# Patient Record
Sex: Male | Born: 1935 | Hispanic: No | Smoking: Former smoker
Health system: Southern US, Community
[De-identification: ages and names within clinical notes are randomized; demographics above are authoritative.]

## PROBLEM LIST (undated history)

## (undated) DIAGNOSIS — R310 Gross hematuria: Secondary | ICD-10-CM

## (undated) DIAGNOSIS — IMO0001 Reserved for inherently not codable concepts without codable children: Secondary | ICD-10-CM

## (undated) DIAGNOSIS — C61 Malignant neoplasm of prostate: Secondary | ICD-10-CM

## (undated) DIAGNOSIS — J449 Chronic obstructive pulmonary disease, unspecified: Secondary | ICD-10-CM

## (undated) DIAGNOSIS — R972 Elevated prostate specific antigen [PSA]: Secondary | ICD-10-CM

## (undated) DIAGNOSIS — M1990 Unspecified osteoarthritis, unspecified site: Secondary | ICD-10-CM

## (undated) DIAGNOSIS — H9190 Unspecified hearing loss, unspecified ear: Secondary | ICD-10-CM

## (undated) DIAGNOSIS — N200 Calculus of kidney: Secondary | ICD-10-CM

## (undated) DIAGNOSIS — Z8719 Personal history of other diseases of the digestive system: Secondary | ICD-10-CM

## (undated) HISTORY — DX: Calculus of kidney: N20.0

## (undated) HISTORY — DX: Chronic obstructive pulmonary disease, unspecified: J44.9

## (undated) HISTORY — PX: BACK SURGERY: SHX140

## (undated) HISTORY — DX: Gross hematuria: R31.0

## (undated) HISTORY — PX: HERNIA REPAIR: SHX51

## (undated) HISTORY — DX: Elevated prostate specific antigen (PSA): R97.20

## (undated) HISTORY — DX: Unspecified osteoarthritis, unspecified site: M19.90

---

## 2008-03-20 ENCOUNTER — Ambulatory Visit: Payer: Self-pay | Admitting: Radiation Oncology

## 2008-04-10 ENCOUNTER — Ambulatory Visit: Payer: Self-pay | Admitting: Urology

## 2008-04-18 ENCOUNTER — Ambulatory Visit: Payer: Self-pay | Admitting: Radiation Oncology

## 2008-04-19 ENCOUNTER — Ambulatory Visit: Payer: Self-pay | Admitting: Radiation Oncology

## 2008-04-24 ENCOUNTER — Ambulatory Visit: Payer: Self-pay | Admitting: Radiation Oncology

## 2008-05-12 ENCOUNTER — Emergency Department: Payer: Self-pay | Admitting: Emergency Medicine

## 2008-05-20 ENCOUNTER — Ambulatory Visit: Payer: Self-pay | Admitting: Radiation Oncology

## 2008-06-19 ENCOUNTER — Ambulatory Visit: Payer: Self-pay | Admitting: Radiation Oncology

## 2008-07-20 ENCOUNTER — Ambulatory Visit: Payer: Self-pay | Admitting: Radiation Oncology

## 2008-08-20 ENCOUNTER — Ambulatory Visit: Payer: Self-pay | Admitting: Radiation Oncology

## 2008-09-04 IMAGING — CT CT GUIDANCE PLACEMENT RAD THERAPY FIELDS
1 series · 16 of 32 positions shown, 20 images · non-contrast
Comparison: none

[Series 2: tx planning · axial · 0.98mm/px · z∈[-92,+185]mm · 16 of 123 slices shown, 20 images]
[im 8/123  soft-tissue]
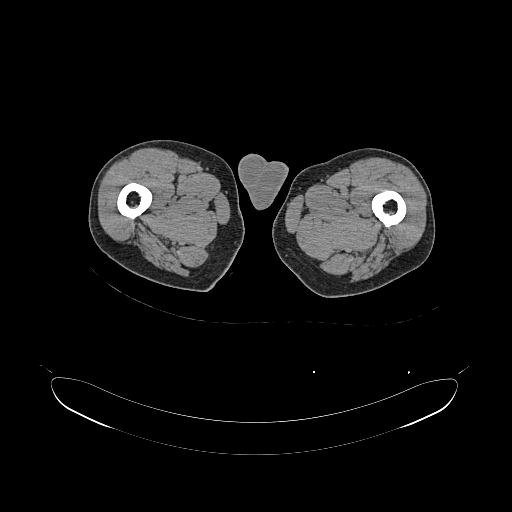
[im 8/123  bone]
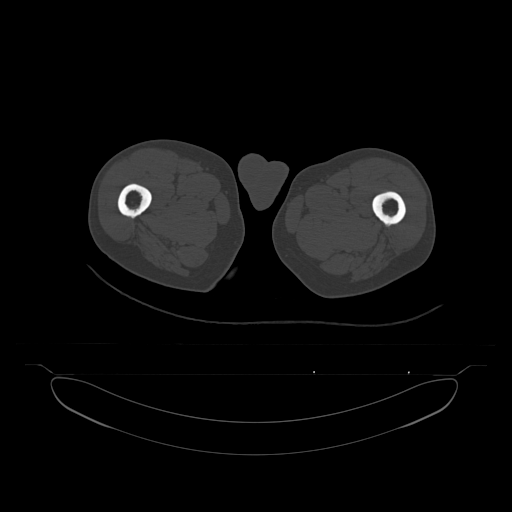
[im 16/123  soft-tissue]
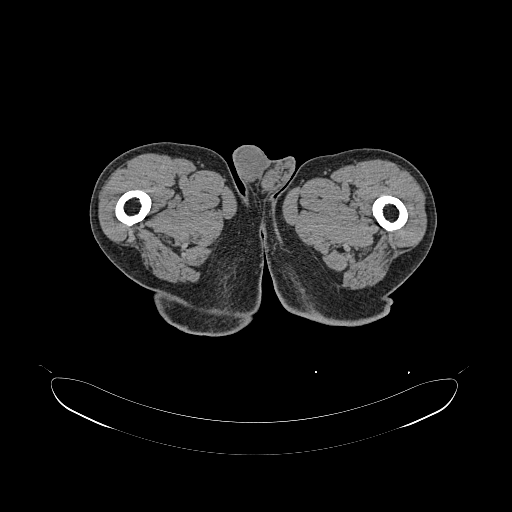
[im 24/123  soft-tissue]
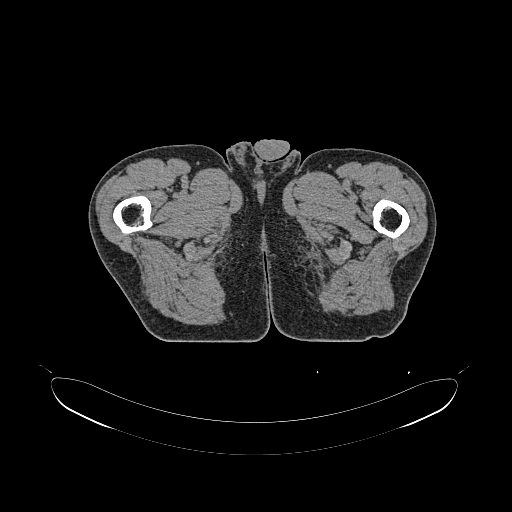
[im 32/123  soft-tissue]
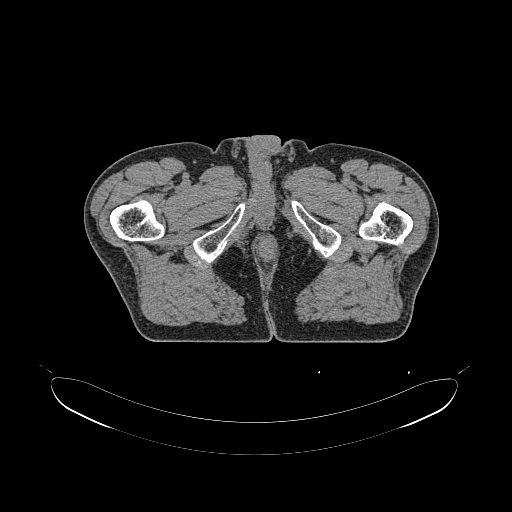
[im 40/123  soft-tissue]
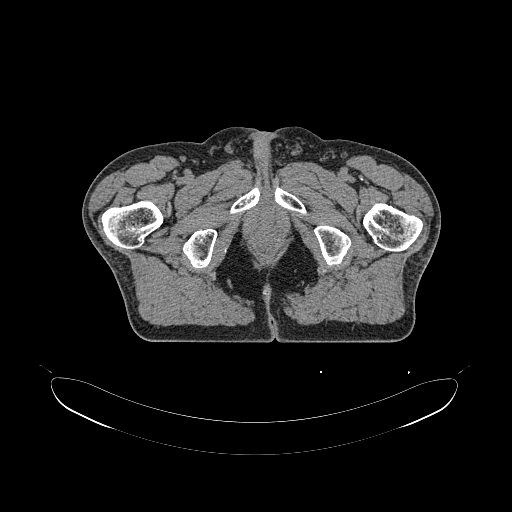
[im 48/123  soft-tissue]
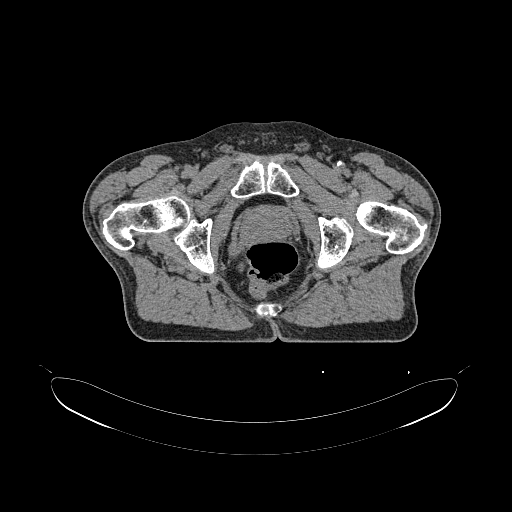
[im 56/123  soft-tissue]
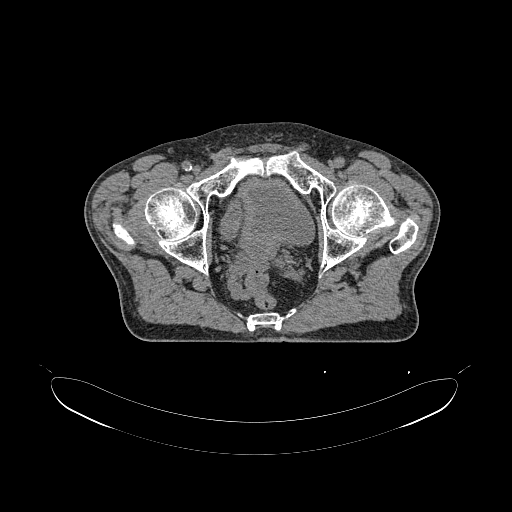
[im 67/123  soft-tissue]
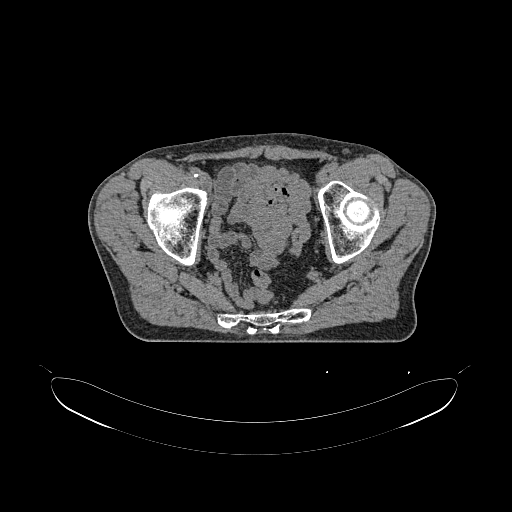
[im 75/123  soft-tissue]
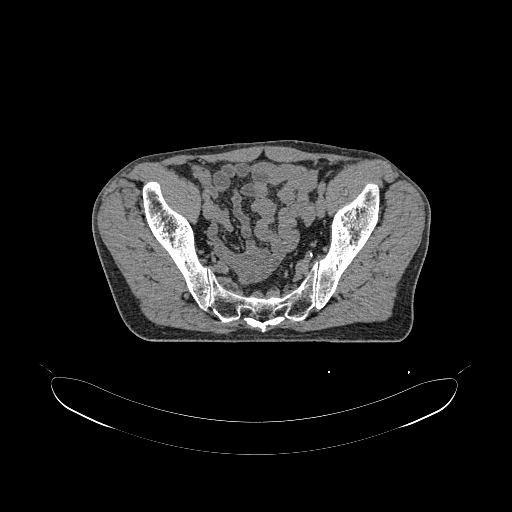
[im 75/123  bone]
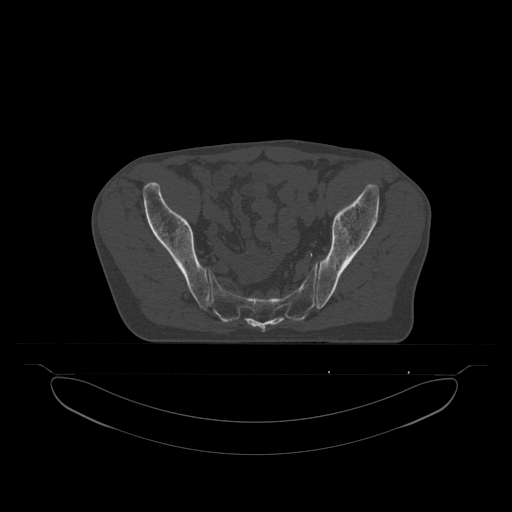
[im 83/123  soft-tissue]
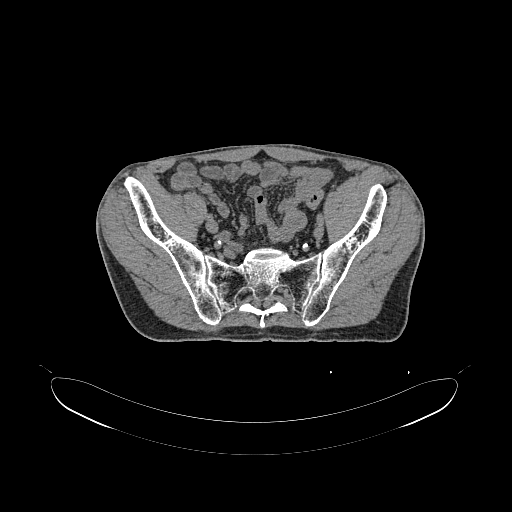
[im 91/123  soft-tissue]
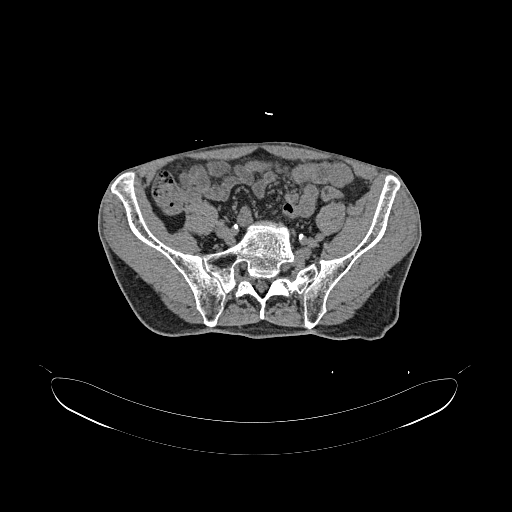
[im 99/123  soft-tissue]
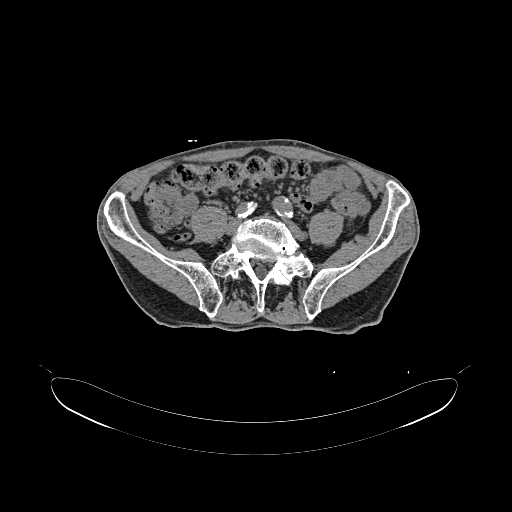
[im 107/123  soft-tissue]
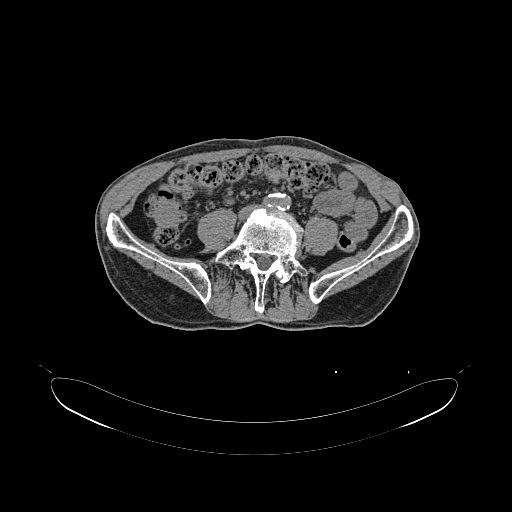
[im 107/123  lung]
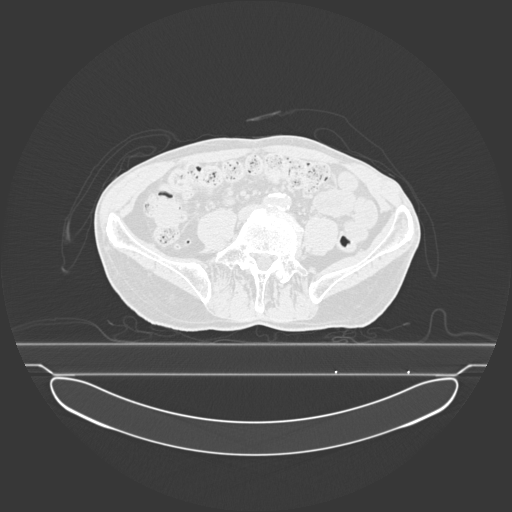
[im 111/123  lung]
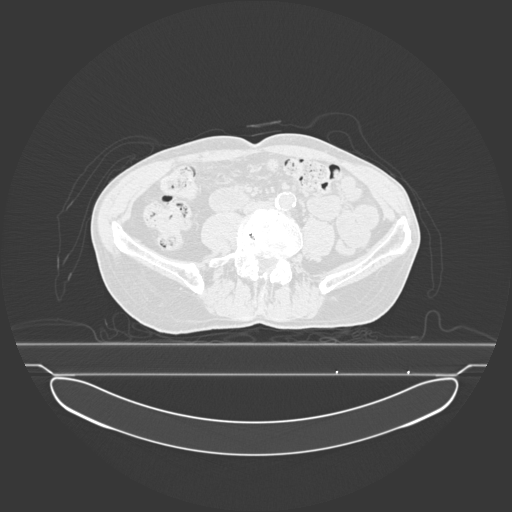
[im 115/123  soft-tissue]
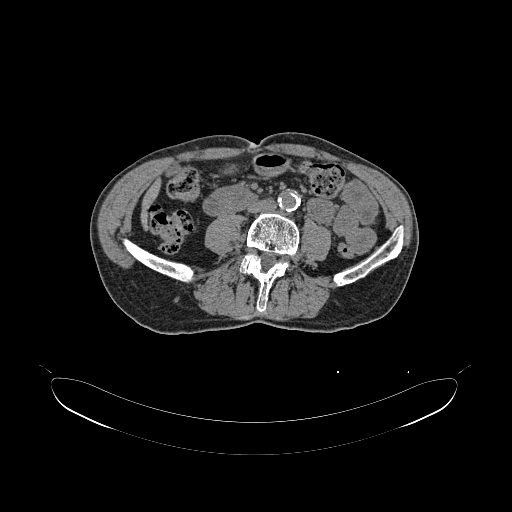
[im 115/123  lung]
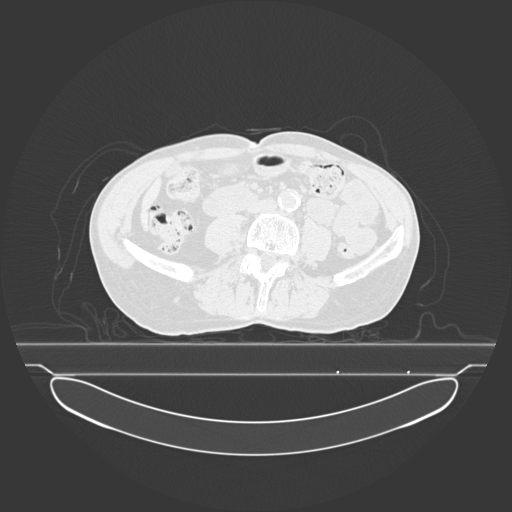
[im 119/123  lung]
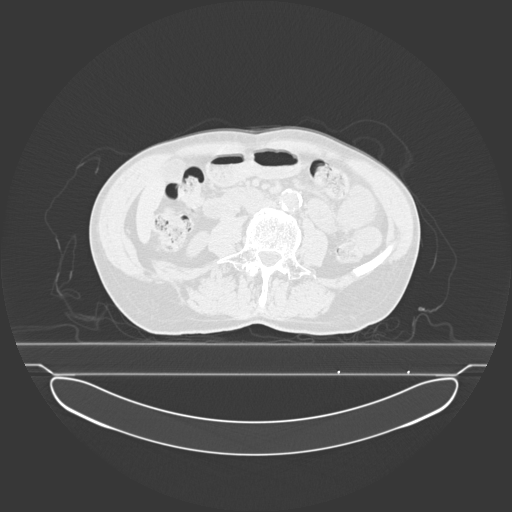

[16 of 32 positions shown; findings below may reference images not displayed]

IMAGES IMPORTED FROM THE SYNGO WORKFLOW SYSTEM
NO DICTATION FOR STUDY

## 2008-09-19 ENCOUNTER — Ambulatory Visit: Payer: Self-pay | Admitting: Radiation Oncology

## 2008-12-20 ENCOUNTER — Ambulatory Visit: Payer: Self-pay | Admitting: Radiation Oncology

## 2008-12-30 ENCOUNTER — Ambulatory Visit: Payer: Self-pay | Admitting: Radiation Oncology

## 2009-01-20 ENCOUNTER — Ambulatory Visit: Payer: Self-pay | Admitting: Radiation Oncology

## 2009-05-20 ENCOUNTER — Ambulatory Visit: Payer: Self-pay | Admitting: Gastroenterology

## 2009-06-02 ENCOUNTER — Ambulatory Visit: Payer: Self-pay | Admitting: Gastroenterology

## 2009-06-19 ENCOUNTER — Ambulatory Visit: Payer: Self-pay | Admitting: Radiation Oncology

## 2009-07-03 ENCOUNTER — Ambulatory Visit: Payer: Self-pay | Admitting: Radiation Oncology

## 2009-07-20 ENCOUNTER — Ambulatory Visit: Payer: Self-pay | Admitting: Radiation Oncology

## 2010-04-02 DIAGNOSIS — Z8546 Personal history of malignant neoplasm of prostate: Secondary | ICD-10-CM | POA: Insufficient documentation

## 2010-04-02 DIAGNOSIS — Z86010 Personal history of colonic polyps: Secondary | ICD-10-CM

## 2010-06-19 ENCOUNTER — Ambulatory Visit: Payer: Self-pay | Admitting: Radiation Oncology

## 2010-07-03 ENCOUNTER — Ambulatory Visit: Payer: Self-pay | Admitting: Radiation Oncology

## 2010-07-20 ENCOUNTER — Ambulatory Visit: Payer: Self-pay | Admitting: Radiation Oncology

## 2010-09-17 ENCOUNTER — Ambulatory Visit: Payer: Self-pay | Admitting: Unknown Physician Specialty

## 2011-07-05 ENCOUNTER — Ambulatory Visit: Payer: Self-pay | Admitting: Unknown Physician Specialty

## 2011-07-09 ENCOUNTER — Ambulatory Visit: Payer: Self-pay | Admitting: Radiation Oncology

## 2011-07-10 LAB — PSA: PSA: 0.5 ng/mL (ref 0.0–4.0)

## 2011-07-21 ENCOUNTER — Ambulatory Visit: Payer: Self-pay | Admitting: Radiation Oncology

## 2012-07-05 ENCOUNTER — Ambulatory Visit: Payer: Self-pay | Admitting: Radiation Oncology

## 2012-07-20 ENCOUNTER — Ambulatory Visit: Payer: Self-pay | Admitting: Radiation Oncology

## 2012-12-20 ENCOUNTER — Ambulatory Visit: Payer: Self-pay | Admitting: Radiation Oncology

## 2013-06-01 DIAGNOSIS — C61 Malignant neoplasm of prostate: Secondary | ICD-10-CM | POA: Insufficient documentation

## 2013-06-01 DIAGNOSIS — R972 Elevated prostate specific antigen [PSA]: Secondary | ICD-10-CM | POA: Insufficient documentation

## 2013-07-04 ENCOUNTER — Ambulatory Visit: Payer: Self-pay | Admitting: Radiation Oncology

## 2013-07-20 ENCOUNTER — Ambulatory Visit: Payer: Self-pay | Admitting: Radiation Oncology

## 2014-01-07 ENCOUNTER — Ambulatory Visit: Payer: Self-pay | Admitting: Radiation Oncology

## 2014-01-20 ENCOUNTER — Ambulatory Visit: Payer: Self-pay | Admitting: Radiation Oncology

## 2014-07-05 ENCOUNTER — Ambulatory Visit: Payer: Self-pay | Admitting: Radiation Oncology

## 2014-09-24 ENCOUNTER — Ambulatory Visit: Payer: Self-pay | Admitting: Urology

## 2015-04-24 ENCOUNTER — Encounter: Payer: Self-pay | Admitting: Radiation Oncology

## 2015-04-24 ENCOUNTER — Ambulatory Visit
Admission: RE | Admit: 2015-04-24 | Discharge: 2015-04-24 | Disposition: A | Discharge status: Home / Self Care | Payer: PPO | Source: Ambulatory Visit | Attending: Radiation Oncology | Admitting: Radiation Oncology

## 2015-04-24 VITALS — BP 126/79 | HR 56 | Temp 97.1°F | Resp 20 | Ht 69.0 in | Wt 143.1 lb

## 2015-04-24 DIAGNOSIS — Z510 Encounter for antineoplastic radiation therapy: Secondary | ICD-10-CM | POA: Insufficient documentation

## 2015-04-24 DIAGNOSIS — C61 Malignant neoplasm of prostate: Secondary | ICD-10-CM

## 2015-04-24 HISTORY — DX: Malignant neoplasm of prostate: C61

## 2015-04-24 NOTE — Consult Note (Signed)
Radiation Oncology NEW PATIENT EVALUATION  Name: Sean Wood.  MRN: [id number]  Date:   04/24/2015     DOB: 02-13-1936   This 79 y.o. male patient presents to the clinic for second opinion about treatment plan for Prostate cancer.  REFERRING PHYSICIAN: Dr. Elnoria Howard CHIEF COMPLAINT:  Chief Complaint  Patient presents with  . Prostate Cancer    Pt is OPNA here to speak to Dr. Baruch Gouty about his options for an increasing PSA    DIAGNOSIS: The encounter diagnosis was Prostate cancer.   PREVIOUS INVESTIGATIONS:  Pathology Report, Imaging and Referral Report are reviewed bone scan is reviewed pathology report from 2 years prior rebiopsy of prostate requested for my review  HPI: Patient 79 year old male well known to our department having been treated in 2009 with IM RT radiation therapy for Gleason 7 (3+4) adenocarcinoma the prostate. In 2013 he started having rising PSA last time I saw him in follow-up was 01/07/2014 and his PSA had doubled over the past year. It is now climbed to around 11 range. He had biopsy about 2 years ago by urology which again was positive for adenocarcinoma I have requested that report for my review. He continues to do well had a bone scan back in October which was negative. He specifically denies diarrhea dysuria or any other GI/GU complaints. He is having no bone pain. He is seen today for consideration of treatment options regarding his rising PSA. He has been offered [facility] agonist by urology.  PLANNED TREATMENT REGIMEN: Observation versus Lupron versus salvage seed implantation  PAST MEDICAL HISTORY:  has a past medical history of Prostate cancer.   This is a is a is a is PAST SURGICAL HISTORY: No past surgical history on file.  FAMILY HISTORY: family history is not on file.  SOCIAL HISTORY:  reports that he has quit smoking. He uses smokeless tobacco.  ALLERGIES: Niacin and related; Statins; and Aleve  MEDICATIONS:  Current Outpatient Prescriptions   Medication Sig Dispense Refill  . Cholecalciferol (VITAMIN D-3) 1000 UNITS CAPS Take 1 capsule by mouth daily.    . Magnesium 400 MG TABS Take 1 tablet by mouth daily.    . Red Yeast Rice 600 MG TABS Take 1 tablet by mouth daily.     No current facility-administered medications for this encounter.    ECOG PERFORMANCE STATUS:  0 - Asymptomatic  REVIEW OF SYSTEMS:  Patient denies any weight loss, fatigue, weakness, fever, chills or night sweats. Patient denies any loss of vision, blurred vision. Patient denies any ringing  of the ears or hearing loss. No irregular heartbeat. Patient denies heart murmur or history of fainting. Patient denies any chest pain or pain radiating to her upper extremities. Patient denies any shortness of breath, difficulty breathing at night, cough or hemoptysis. Patient denies any swelling in the lower legs. Patient denies any nausea vomiting, vomiting of blood, or coffee ground material in the vomitus. Patient denies any stomach pain. Patient states has had normal bowel movements no significant constipation or diarrhea. Patient denies any dysuria, hematuria or significant nocturia. Patient denies any problems walking, swelling in the joints or loss of balance. Patient denies any skin changes, loss of hair or loss of weight. Patient denies any excessive worrying or anxiety or significant depression. Patient denies any problems with insomnia. Patient denies excessive thirst, polyuria, polydipsia. Patient denies any swollen glands, patient denies easy bruising or easy bleeding. Patient denies any recent infections, allergies or URI. Patient "s visual fields have  not changed significantly in recent time.    PHYSICAL EXAM: BP 126/79 mmHg  Pulse 56  Temp(Src) 97.1 F (36.2 C)  Resp 20  Ht 5\' 9"  (1.753 m)  Wt 143 lb 1.3 oz (64.9 kg)  BMI 21.12 kg/m2 Well-developed thin male in NAD. On rectal exam rectal sphincter tone is good. Prostate is firm asymmetric with right lobe  greater than the left. Sulcus is preserved bilaterally no other rectal abnormalities identified. Well-developed well-nourished patient in NAD. HEENT reveals PERLA, EOMI, discs not visualized.  Oral cavity is clear. No oral mucosal lesions are identified. Neck is clear without evidence of cervical or supraclavicular adenopathy. Lungs are clear to A&P. Cardiac examination is essentially unremarkable with regular rate and rhythm without murmur rub or thrill. Abdomen is benign with no organomegaly or masses noted. Motor sensory and DTR levels are equal and symmetric in the upper and lower extremities. Cranial nerves II through XII are grossly intact. Proprioception is intact. No peripheral adenopathy or edema is identified. No motor or sensory levels are noted. Crude visual fields are within normal range.   LABORATORY DATA:  No results found for this or any previous visit (from the past 72 hour(s)).   RADIOLOGY RESULTS: Bone scan is reviewed  IMPRESSION: Biochemical recurrence of his prostate cancer with biopsy-proven residual disease in his prostate in 79 year old male now out 7 years sinceIMRT radiation therapy.  PLAN: At this time we have discussed treatment options. I would classify them at the 3 options 1 being observation which I do not recommend since his PSA is over 10 greatly increasing his chances for developing bone metastasis. [place] would be Lupron therapy and the third would be salvage seed implantation. His been 2 years since his biopsy and I believe we he may benefit at this time from Lupron therapy to see if we can reset his PSA. Should the patient desire salvage seed implantation he will call our office and I have discussed the risks and benefits of that procedure. Risks and benefits of Lupron therapy were also discussed with the patient and his wife both saw comprehend my treatment plan and recommendations well. Patient has a follow-up appointment next week with Dr. Elnoria Howard.  I would like  to take this opportunity for allowing me to participate in the care of your patient.Armstead Peaks., MD

## 2015-05-26 ENCOUNTER — Ambulatory Visit
Admission: RE | Admit: 2015-05-26 | Discharge: 2015-05-26 | Disposition: A | Discharge status: Home / Self Care | Payer: PPO | Source: Ambulatory Visit | Attending: Radiation Oncology | Admitting: Radiation Oncology

## 2015-05-26 ENCOUNTER — Ambulatory Visit: Admission: RE | Admit: 2015-05-26 | Payer: PPO | Source: Ambulatory Visit | Admitting: Urology

## 2015-05-26 ENCOUNTER — Encounter: Payer: Self-pay | Admitting: Radiation Oncology

## 2015-05-26 VITALS — BP 140/75 | HR 60 | Temp 94.3°F | Resp 20 | Wt 143.7 lb

## 2015-05-26 DIAGNOSIS — C61 Malignant neoplasm of prostate: Secondary | ICD-10-CM | POA: Diagnosis not present

## 2015-05-26 DIAGNOSIS — Z510 Encounter for antineoplastic radiation therapy: Secondary | ICD-10-CM | POA: Diagnosis present

## 2015-05-26 SURGERY — ULTRASOUND, PROSTATE, FOR VOLUME DETERMINATION
Anesthesia: Choice

## 2015-05-29 ENCOUNTER — Other Ambulatory Visit: Payer: Self-pay | Admitting: Radiation Oncology

## 2015-05-29 DIAGNOSIS — Z510 Encounter for antineoplastic radiation therapy: Secondary | ICD-10-CM | POA: Diagnosis not present

## 2015-05-29 NOTE — Progress Notes (Signed)
Radiation Oncology Procedure Note  Name: Sean Wood.  MRN: [id number]  Date:   05/26/2015     DOB: September 21, 1936   This 79 y.o. male patient presents to the clinic for volume study for prostate cancer.  CHIEF COMPLAINT:  Chief Complaint  Patient presents with  . Prostate Cancer    Pt is here post volume study prior to prostate seed implant.    DIAGNOSIS: The encounter diagnosis was Malignant neoplasm of prostate.  [id number] 79 year old male well known to our department having been treated in 2009 with IM RT radiation therapy for Gleason 7 (3+4) adenocarcinoma the prostate. In 2013 he started having rising PSA last time I saw him in follow-up was 01/07/2014 and his PSA had doubled over the past year. It is now climbed to around 11 range. He had biopsy about 2 years ago by urology which again was positive for adenocarcinoma I have requested that report for my review. He continues to do well had a bone scan back in October which was negative. He specifically denies diarrhea dysuria or any other GI/GU complaints. He is having no bone pain. He is seen today for consideration of treatment options regarding his rising PSA. He has been offered [facility] agonist by urology. Patient is seen today for volume study.  PHYSICAL EXAM: Well-developed well-nourished patient in NAD. HEENT reveals PERLA, EOMI, discs not visualized.  Oral cavity is clear. No oral mucosal lesions are identified. Neck is clear without evidence of cervical or supraclavicular adenopathy. Lungs are clear to A&P. Cardiac examination is essentially unremarkable with regular rate and rhythm without murmur rub or thrill. Abdomen is benign with no organomegaly or masses noted. Motor sensory and DTR levels are equal and symmetric in the upper and lower extremities. Cranial nerves II through XII are grossly intact. Proprioception is intact. No peripheral adenopathy or edema is identified. No motor or sensory levels are noted. Crude visual  fields are within normal range.   BP Readings from Last 1 Encounters:  05/26/15 140/75   Pulse Readings from Last 1 Encounters:  05/26/15 60   Wt Readings from Last 1 Encounters:  05/26/15 143 lb 11.8 oz (65.2 kg)     PROCEDURE: Patient was taken to the cystoscopy suite in the OR. Patient was placed in the low lithotomy position. Foley catheter was placed. Trans-rectal ultrasound probe was inserted into the rectum and prostate seminal vesicles were visualized as well as bladder base. stepping images were performed on a 5 mm increments. Images will be placed in BrachyVision treatment planning system to determine seed placement coordinates for eventual I-125 interstitial implant. Images will be reviewed with the physics and dosimetry staff for final quality approval. I personally was present for the volume study and assisted in delineation of contour volumes.  At the end of the procedure Foley catheter was removed, rectal ultrasound probe was removed. Patient tolerated his procedures extremely well with no side effects or complaints. Patient has given appointment for interstitial implant date. Consent was signed today as well as history and physical performed in preparation for his outpatient surgical implant.    PLAN: CONSENT forms were signed. Patient is scheduled for I-125 interstitial implant with the intent to deliver boost for salvage radiation therapy to his prostate volume. Risks and benefits of treatment were explained to the patient in detail. I would like to take this opportunity for allowing me to participate in the care of your patient.Armstead Peaks., MD

## 2015-06-16 ENCOUNTER — Encounter
Admission: RE | Admit: 2015-06-16 | Discharge: 2015-06-16 | Disposition: A | Discharge status: Home / Self Care | Payer: PPO | Source: Ambulatory Visit | Attending: Radiation Oncology | Admitting: Radiation Oncology

## 2015-06-16 DIAGNOSIS — Z809 Family history of malignant neoplasm, unspecified: Secondary | ICD-10-CM | POA: Insufficient documentation

## 2015-06-16 DIAGNOSIS — Z01812 Encounter for preprocedural laboratory examination: Secondary | ICD-10-CM | POA: Insufficient documentation

## 2015-06-16 DIAGNOSIS — C61 Malignant neoplasm of prostate: Secondary | ICD-10-CM | POA: Diagnosis not present

## 2015-06-16 DIAGNOSIS — J449 Chronic obstructive pulmonary disease, unspecified: Secondary | ICD-10-CM | POA: Insufficient documentation

## 2015-06-16 DIAGNOSIS — Z833 Family history of diabetes mellitus: Secondary | ICD-10-CM | POA: Insufficient documentation

## 2015-06-16 DIAGNOSIS — Z8249 Family history of ischemic heart disease and other diseases of the circulatory system: Secondary | ICD-10-CM | POA: Diagnosis not present

## 2015-06-16 DIAGNOSIS — Z01810 Encounter for preprocedural cardiovascular examination: Secondary | ICD-10-CM | POA: Insufficient documentation

## 2015-06-16 DIAGNOSIS — R0602 Shortness of breath: Secondary | ICD-10-CM | POA: Insufficient documentation

## 2015-06-16 DIAGNOSIS — N200 Calculus of kidney: Secondary | ICD-10-CM | POA: Insufficient documentation

## 2015-06-16 HISTORY — DX: Personal history of other diseases of the digestive system: Z87.19

## 2015-06-16 HISTORY — DX: Reserved for inherently not codable concepts without codable children: IMO0001

## 2015-06-16 HISTORY — DX: Unspecified hearing loss, unspecified ear: H91.90

## 2015-06-16 LAB — HEMOGLOBIN: HEMOGLOBIN: 14.8 g/dL (ref 13.0–18.0)

## 2015-06-16 LAB — BASIC METABOLIC PANEL
Anion gap: 6 (ref 5–15)
BUN: 15 mg/dL (ref 6–20)
CHLORIDE: 103 mmol/L (ref 101–111)
CO2: 33 mmol/L — ABNORMAL HIGH (ref 22–32)
Calcium: 9.5 mg/dL (ref 8.9–10.3)
Creatinine, Ser: 0.95 mg/dL (ref 0.61–1.24)
GFR calc Af Amer: >60 mL/min (ref >60)
Glucose, Bld: 86 mg/dL (ref 65–99)
POTASSIUM: 4.5 mmol/L (ref 3.5–5.1)
SODIUM: 142 mmol/L (ref 135–145)

## 2015-06-16 NOTE — Patient Instructions (Signed)
  Your procedure is scheduled on: June 24, 2015 (Tuesday) Report to Day Surgery. To find out your arrival time please call [phone] between 1PM - 3PM on June 20, 2015 (Friday).  Remember: Instructions that are not followed completely may result in serious medical risk, up to and including death, or upon the discretion of your surgeon and anesthesiologist your surgery may need to be rescheduled.    __x__ 1. Do not eat food or drink liquids after midnight. No gum chewing or hard candies. (CLEAR LIQUID DIET FOR 48 HOURS)    ____ 2. No Alcohol for 24 hours before or after surgery.   ____ 3. Bring all medications with you on the day of surgery if instructed.    __X__ 4. Notify your doctor if there is any change in your medical condition     (cold, fever, infections).     Do not wear jewelry, make-up, hairpins, clips or nail polish.  Do not wear lotions, powders, or perfumes. You may wear deodorant.  Do not shave 48 hours prior to surgery. Men may shave face and neck.  Do not bring valuables to the hospital.    [facility] is not responsible for any belongings or valuables.               Contacts, dentures or bridgework may not be worn into surgery.  Leave your suitcase in the car. After surgery it may be brought to your room.  For patients admitted to the hospital, discharge time is determined by your                treatment team.   Patients discharged the day of surgery will not be allowed to drive home.   Please read over the following fact sheets that you were given:   Surgical Site Infection Prevention   ____ Take these medicines the morning of surgery with A SIP OF WATER:    1.   2.   3.   4.  5.  6.  __X__ Fleet Enema (as directed) ( 2 HOURS PRIOR TO SURGERY)  __x__ Use CHG Soap as directed  ____ Use inhalers on the day of surgery  ____ Stop metformin 2 days prior to surgery    ____ Take 1/2 of usual insulin dose the night before surgery and none on the morning of  surgery.   ____ Stop Coumadin/Plavix/aspirin on   ____ Stop Anti-inflammatories on   __x__ Stop supplements until after surgery. (STOP RED YEAST RICE UNTIL AFTER SURGERY) ____ Bring C-Pap to the hospital.

## 2015-06-24 ENCOUNTER — Ambulatory Visit: Payer: PPO | Admitting: Anesthesiology

## 2015-06-24 ENCOUNTER — Inpatient Hospital Stay: Admission: RE | Admit: 2015-06-24 | Payer: PPO | Source: Ambulatory Visit

## 2015-06-24 ENCOUNTER — Ambulatory Visit: Payer: PPO | Admitting: Radiation Oncology

## 2015-06-24 ENCOUNTER — Encounter
Admission: RE | Disposition: A | Discharge status: Home / Self Care | Payer: Self-pay | Source: Ambulatory Visit | Attending: Urology

## 2015-06-24 ENCOUNTER — Other Ambulatory Visit: Payer: Self-pay | Admitting: Radiation Oncology

## 2015-06-24 ENCOUNTER — Encounter: Payer: Self-pay | Admitting: Anesthesiology

## 2015-06-24 ENCOUNTER — Ambulatory Visit
Admission: RE | Admit: 2015-06-24 | Discharge: 2015-06-24 | Disposition: A | Discharge status: Home / Self Care | Payer: PPO | Source: Ambulatory Visit | Attending: Urology | Admitting: Urology

## 2015-06-24 DIAGNOSIS — Z87891 Personal history of nicotine dependence: Secondary | ICD-10-CM | POA: Insufficient documentation

## 2015-06-24 DIAGNOSIS — K449 Diaphragmatic hernia without obstruction or gangrene: Secondary | ICD-10-CM | POA: Insufficient documentation

## 2015-06-24 DIAGNOSIS — C61 Malignant neoplasm of prostate: Secondary | ICD-10-CM | POA: Diagnosis not present

## 2015-06-24 DIAGNOSIS — Z886 Allergy status to analgesic agent status: Secondary | ICD-10-CM | POA: Diagnosis not present

## 2015-06-24 DIAGNOSIS — Z809 Family history of malignant neoplasm, unspecified: Secondary | ICD-10-CM | POA: Insufficient documentation

## 2015-06-24 DIAGNOSIS — Z8249 Family history of ischemic heart disease and other diseases of the circulatory system: Secondary | ICD-10-CM | POA: Diagnosis not present

## 2015-06-24 DIAGNOSIS — Z79899 Other long term (current) drug therapy: Secondary | ICD-10-CM | POA: Insufficient documentation

## 2015-06-24 DIAGNOSIS — Z87442 Personal history of urinary calculi: Secondary | ICD-10-CM | POA: Diagnosis not present

## 2015-06-24 DIAGNOSIS — M1380 Other specified arthritis, unspecified site: Secondary | ICD-10-CM | POA: Diagnosis not present

## 2015-06-24 DIAGNOSIS — J449 Chronic obstructive pulmonary disease, unspecified: Secondary | ICD-10-CM | POA: Insufficient documentation

## 2015-06-24 DIAGNOSIS — Z888 Allergy status to other drugs, medicaments and biological substances status: Secondary | ICD-10-CM | POA: Insufficient documentation

## 2015-06-24 DIAGNOSIS — Z833 Family history of diabetes mellitus: Secondary | ICD-10-CM | POA: Insufficient documentation

## 2015-06-24 HISTORY — PX: CYSTOSCOPY: SHX5120

## 2015-06-24 HISTORY — PX: RADIOACTIVE SEED IMPLANT: SHX5150

## 2015-06-24 SURGERY — INSERTION, RADIATION SOURCE, PROSTATE
Anesthesia: General | Wound class: Clean Contaminated

## 2015-06-24 MED ORDER — HYDROCODONE-ACETAMINOPHEN 5-325 MG PO TABS: 1.0000 | ORAL_TABLET | Freq: Four times a day (QID) | ORAL | Status: DC | PRN

## 2015-06-24 MED ORDER — OXYCODONE HCL 5 MG PO TABS: 5.0000 mg | ORAL_TABLET | Freq: Once | ORAL | Status: DC | PRN

## 2015-06-24 MED ORDER — CEFAZOLIN SODIUM 1-5 GM-% IV SOLN
INTRAVENOUS | Status: AC
  Filled 2015-06-24: qty 50

## 2015-06-24 MED ORDER — SODIUM CHLORIDE 0.9 % IR SOLN
Status: DC | PRN
  Administered 2015-06-24: 600 mL

## 2015-06-24 MED ORDER — ONDANSETRON HCL 4 MG/2ML IJ SOLN
INTRAMUSCULAR | Status: DC | PRN
  Administered 2015-06-24: 4 mg via INTRAVENOUS

## 2015-06-24 MED ORDER — FENTANYL CITRATE (PF) 100 MCG/2ML IJ SOLN: 25.0000 ug | INTRAMUSCULAR | Status: DC | PRN

## 2015-06-24 MED ORDER — TAMSULOSIN HCL 0.4 MG PO CAPS: 0.4000 mg | ORAL_CAPSULE | Freq: Every day | ORAL | Status: DC

## 2015-06-24 MED ORDER — CEFAZOLIN SODIUM 1-5 GM-% IV SOLN
1.0000 g | Freq: Once | INTRAVENOUS | Status: AC
  Administered 2015-06-24: 1 g via INTRAVENOUS

## 2015-06-24 MED ORDER — PROPOFOL 10 MG/ML IV BOLUS
INTRAVENOUS | Status: DC | PRN
  Administered 2015-06-24: 80 mg via INTRAVENOUS

## 2015-06-24 MED ORDER — DEXAMETHASONE SODIUM PHOSPHATE 4 MG/ML IJ SOLN
INTRAMUSCULAR | Status: DC | PRN
  Administered 2015-06-24: 8 mg via INTRAVENOUS

## 2015-06-24 MED ORDER — FENTANYL CITRATE (PF) 100 MCG/2ML IJ SOLN
INTRAMUSCULAR | Status: DC | PRN
  Administered 2015-06-24 (×4): 25 ug via INTRAVENOUS

## 2015-06-24 MED ORDER — EPHEDRINE SULFATE 50 MG/ML IJ SOLN
INTRAMUSCULAR | Status: DC | PRN
  Administered 2015-06-24 (×3): 5 mg via INTRAVENOUS

## 2015-06-24 MED ORDER — LACTATED RINGERS IV SOLN
INTRAVENOUS | Status: DC
  Administered 2015-06-24: 07:00:00 via INTRAVENOUS

## 2015-06-24 MED ORDER — GLYCOPYRROLATE 0.2 MG/ML IJ SOLN
INTRAMUSCULAR | Status: DC | PRN
  Administered 2015-06-24: 0.2 mg via INTRAVENOUS

## 2015-06-24 MED ORDER — FAMOTIDINE 20 MG PO TABS
20.0000 mg | ORAL_TABLET | Freq: Once | ORAL | Status: AC
  Administered 2015-06-24: 20 mg via ORAL

## 2015-06-24 MED ORDER — LIDOCAINE HCL (CARDIAC) 20 MG/ML IV SOLN
INTRAVENOUS | Status: DC | PRN
  Administered 2015-06-24: 100 mg via INTRAVENOUS

## 2015-06-24 MED ORDER — OXYCODONE HCL 5 MG/5ML PO SOLN: 5.0000 mg | Freq: Once | ORAL | Status: DC | PRN

## 2015-06-24 MED ORDER — CIPROFLOXACIN HCL 500 MG PO TABS: 500.0000 mg | ORAL_TABLET | Freq: Two times a day (BID) | ORAL | Status: DC

## 2015-06-24 SURGICAL SUPPLY — 31 items
BAG URO DRAIN 2000ML W/SPOUT (MISCELLANEOUS) ×4 IMPLANT
BLADE CLIPPER SURG (BLADE) ×4 IMPLANT
CATH FOL 2WAY LX 16X5 (CATHETERS) ×4 IMPLANT
CATH ROBINSON RED A/P 16FR (CATHETERS) ×2 IMPLANT
DRAPE INCISE 23X17 IOBAN STRL (DRAPES) ×2
DRAPE INCISE 23X17 STRL (DRAPES) ×2 IMPLANT
DRAPE INCISE IOBAN 23X17 STRL (DRAPES) ×2 IMPLANT
DRAPE TABLE BACK 80X90 (DRAPES) ×4 IMPLANT
DRAPE UNDER BUTTOCK W/FLU (DRAPES) ×4 IMPLANT
GLOVE BIO SURGEON STRL SZ 6.5 (GLOVE) ×6 IMPLANT
GLOVE BIO SURGEON STRL SZ7 (GLOVE) ×8 IMPLANT
GLOVE BIO SURGEON STRL SZ7.5 (GLOVE) ×8 IMPLANT
GLOVE BIO SURGEONS STRL SZ 6.5 (GLOVE) ×2
GOWN STRL REUS W/ TWL LRG LVL3 (GOWN DISPOSABLE) ×4 IMPLANT
GOWN STRL REUS W/ TWL XL LVL3 (GOWN DISPOSABLE) ×2 IMPLANT
GOWN STRL REUS W/TWL LRG LVL3 (GOWN DISPOSABLE) ×8
GOWN STRL REUS W/TWL XL LVL3 (GOWN DISPOSABLE) ×4
GRADUATE 1200CC STRL 31836 (MISCELLANEOUS) ×2 IMPLANT
IV NS 1000ML (IV SOLUTION) ×4
IV NS 1000ML BAXH (IV SOLUTION) ×2 IMPLANT
JELLY LUB 2OZ STRL (MISCELLANEOUS) ×2
JELLY LUBE 2OZ STRL (MISCELLANEOUS) ×2 IMPLANT
KIT RM TURNOVER CYSTO AR (KITS) ×4 IMPLANT
PACK CYSTO AR (MISCELLANEOUS) ×4 IMPLANT
PAD PREP 24X41 OB/GYN DISP (PERSONAL CARE ITEMS) ×4 IMPLANT
SET CYSTO W/LG BORE CLAMP LF (SET/KITS/TRAYS/PACK) ×4 IMPLANT
SOL PREP PVP 2OZ (MISCELLANEOUS) ×4
SOLUTION PREP PVP 2OZ (MISCELLANEOUS) ×2 IMPLANT
SYRINGE IRR TOOMEY STRL 70CC (SYRINGE) ×4 IMPLANT
WATER STERILE IRR 1000ML POUR (IV SOLUTION) ×4 IMPLANT
WATER STERILE IRR 3000ML UROMA (IV SOLUTION) ×2 IMPLANT

## 2015-06-24 NOTE — Op Note (Signed)
Preoperative diagnosis: Adenocarcinoma of the prostate   Postoperative diagnosis: Same   Procedure: I-125 prostate seed implantation, cystoscopy  Surgeon: Hollice Espy M.D. , Lavena Stanford, M.D.   Anesthesia: General  Drains: none  Complications: none  Indications: 79 yo M with history of prostate cancer s/p ERBT, biochemical recurrence presents for salvage brachytherapy.    Procedure: Patient was brought to operating suite and placement table in the supine position. At this time, a universal timeout protocol was performed, all team members were identified, Venodyne boots are placed, and he was administered IV Ancef in the preoperative period. He was placed in lithotomy position and prepped and draped in usual manner. Radiation oncology department placed a transrectal ultrasound probe anchoring stand/ grid and aligned with previous imaging from the volume study. Foley catheter was inserted without difficulty.  All needle passage was done with real-time transrectal ultrasound guidance in both the transverse and sagittal plains in order to achieve the desired preplanned position. A total of 12 needles were placed.  50 active seeds were implanted. The Foley catheter was removed and a rigid cystoscopy failed to show any seeds outside the prostate without evidence of trauma to the urethral, prostatic fossa, or bladder.  The bladder was drained.  A fluoroscopic image was then obtained showing excellent distrubution of the brachytherapy seeds.  Each seed was counted and counts were correct.    The patient was then repositioned in the supine position, reversed from anesthesia, and taken to the PACU in stable condition.

## 2015-06-24 NOTE — Interval H&P Note (Signed)
History and Physical Interval Note:  06/24/2015 7:32 AM  Sean Wood.  has presented today for surgery, with the diagnosis of PROSTATE CANCER  The various methods of treatment have been discussed with the patient and family. After consideration of risks, benefits and other options for treatment, the patient has consented to  Procedure(s): RADIOACTIVE SEED IMPLANT/BRACHYTHERAPY IMPLANT (N/A) as a surgical intervention .  The patient's history has been reviewed, patient examined, no change in status, stable for surgery.  I have reviewed the patient's chart and labs.  Questions were answered to the patient's satisfaction.    RRR CTAB   Hollice Espy

## 2015-06-24 NOTE — Anesthesia Postprocedure Evaluation (Signed)
  Anesthesia Post-op Note  Patient: Sean Wood.  Procedure(s) Performed: Procedure(s): RADIOACTIVE SEED IMPLANT/BRACHYTHERAPY IMPLANT (N/A) CYSTOSCOPY  Anesthesia type:General  Patient location: PACU  Post pain: Pain level controlled  Post assessment: Post-op Vital signs reviewed, Patient's Cardiovascular Status Stable, Respiratory Function Stable, Patent Airway and No signs of Nausea or vomiting  Post vital signs: Reviewed and stable  Last Vitals:  Filed Vitals:   06/24/15 0902  BP: 100/63  Pulse: 72  Temp: 36.4 C  Resp: 19    Level of consciousness: awake, alert  and patient cooperative  Complications: No apparent anesthesia complications

## 2015-06-24 NOTE — Anesthesia Preprocedure Evaluation (Signed)
Anesthesia Evaluation  Patient identified by MRN, date of birth, ID band Patient awake    Reviewed: Allergy & Precautions, H&P , NPO status , Patient's Chart, lab work & pertinent test results, reviewed documented beta blocker date and time   Airway Mallampati: II  TM Distance: >3 FB Neck ROM: full    Dental no notable dental hx. (+) Teeth Intact   Pulmonary neg pulmonary ROS, shortness of breath, COPD breath sounds clear to auscultation  Pulmonary exam normal       Cardiovascular Exercise Tolerance: Good negative cardio ROS Normal cardiovascular examRhythm:regular Rate:Normal     Neuro/Psych negative neurological ROS  negative psych ROS   GI/Hepatic Neg liver ROS, hiatal hernia,   Endo/Other  negative endocrine ROS  Renal/GU Renal diseasenegative Renal ROS  negative genitourinary   Musculoskeletal   Abdominal   Peds  Hematology negative hematology ROS (+)   Anesthesia Other Findings   Reproductive/Obstetrics (+) Pregnancy                             Anesthesia Physical Anesthesia Plan  ASA: III  Anesthesia Plan: General   Post-op Pain Management:    Induction:   Airway Management Planned:   Additional Equipment:   Intra-op Plan:   Post-operative Plan:   Informed Consent: I have reviewed the patients History and Physical, chart, labs and discussed the procedure including the risks, benefits and alternatives for the proposed anesthesia with the patient or authorized representative who has indicated his/her understanding and acceptance.     Plan Discussed with: CRNA, Anesthesiologist and Surgeon  Anesthesia Plan Comments:         Anesthesia Quick Evaluation

## 2015-06-24 NOTE — Anesthesia Procedure Notes (Signed)
Procedure Name: LMA Insertion Date/Time: 06/24/2015 7:39 AM Performed by: Alda Berthold Pre-anesthesia Checklist: Patient identified, Emergency Drugs available, Suction available and Patient being monitored Patient Re-evaluated:Patient Re-evaluated prior to inductionOxygen Delivery Method: Circle system utilized Preoxygenation: Pre-oxygenation with 100% oxygen Intubation Type: IV induction Ventilation: Mask ventilation without difficulty LMA: LMA inserted LMA Size: 4.0 Number of attempts: 1 Tube secured with: Tape Dental Injury: Teeth and Oropharynx as per pre-operative assessment

## 2015-06-24 NOTE — Progress Notes (Signed)
Bladder scanned    Less than 10cc

## 2015-06-24 NOTE — H&P (Signed)
Radiation Oncology Procedure Note  Name: Sean Wood. MRN: [id number] Date: 05/26/2015  DOB: 11/28/1936   This 79 y.o. male patient presents to the clinic for volume study for prostate cancer.  CHIEF COMPLAINT:  Chief Complaint  Patient presents with  . Prostate Cancer    Pt is here post volume study prior to prostate seed implant.    DIAGNOSIS: The encounter diagnosis was Malignant neoplasm of prostate.  [id number] 79 year old male well known to our department having been treated in 2009 with IM RT radiation therapy for Gleason 7 (3+4) adenocarcinoma the prostate. In 2013 he started having rising PSA last time I saw him in follow-up was 01/07/2014 and his PSA had doubled over the past year. It is now climbed to around 11 range. He had biopsy about 2 years ago by urology which again was positive for adenocarcinoma I have requested that report for my review. He continues to do well had a bone scan back in October which was negative. He specifically denies diarrhea dysuria or any other GI/GU complaints. He is having no bone pain. He is seen today for consideration of treatment options regarding his rising PSA. He has been offered [facility] agonist by urology. Patient is seen today for volume study.  PHYSICAL EXAM: Well-developed well-nourished patient in NAD. HEENT reveals PERLA, EOMI, discs not visualized. Oral cavity is clear. No oral mucosal lesions are identified. Neck is clear without evidence of cervical or supraclavicular adenopathy. Lungs are clear to A&P. Cardiac examination is essentially unremarkable with regular rate and rhythm without murmur rub or thrill. Abdomen is benign with no organomegaly or masses noted. Motor sensory and DTR levels are equal and symmetric in the upper and lower extremities. Cranial nerves II through XII are grossly intact. Proprioception is intact. No peripheral adenopathy or edema is identified. No motor or sensory levels are  noted. Crude visual fields are within normal range.   BP Readings from Last 1 Encounters:  05/26/15 140/75   Pulse Readings from Last 1 Encounters:  05/26/15 60   Wt Readings from Last 1 Encounters:  05/26/15 143 lb 11.8 oz (65.2 kg)     PROCEDURE: Patient was taken to the cystoscopy suite in the OR. Patient was placed in the low lithotomy position. Foley catheter was placed. Trans-rectal ultrasound probe was inserted into the rectum and prostate seminal vesicles were visualized as well as bladder base. stepping images were performed on a 5 mm increments. Images will be placed in BrachyVision treatment planning system to determine seed placement coordinates for eventual I-125 interstitial implant. Images will be reviewed with the physics and dosimetry staff for final quality approval. I personally was present for the volume study and assisted in delineation of contour volumes.  At the end of the procedure Foley catheter was removed, rectal ultrasound probe was removed. Patient tolerated his procedures extremely well with no side effects or complaints. Patient has given appointment for interstitial implant date. Consent was signed today as well as history and physical performed in preparation for his outpatient surgical implant.    PLAN: CONSENT forms were signed. Patient is scheduled for I-125 interstitial implant with the intent to deliver boost for salvage radiation therapy to his prostate volume. Risks and benefits of treatment were explained to the patient in detail. I would like to take this opportunity for allowing me to participate in the care of your patient.Armstead Peaks., MD

## 2015-06-24 NOTE — Transfer of Care (Signed)
Immediate Anesthesia Transfer of Care Note  Patient: Sean Wood.  Procedure(s) Performed: Procedure(s): RADIOACTIVE SEED IMPLANT/BRACHYTHERAPY IMPLANT (N/A)  Patient Location: PACU  Anesthesia Type:General  Level of Consciousness: sedated  Airway & Oxygen Therapy: Patient Spontanous Breathing and Patient connected to nasal cannula oxygen  Post-op Assessment: Report given to RN and Post -op Vital signs reviewed and stable  Post vital signs: Reviewed and stable  Last Vitals:  Filed Vitals:   06/24/15 0627  BP: 105/51  Pulse: 53  Temp: 36.4 C  Resp: 16    Complications: No apparent anesthesia complications

## 2015-06-24 NOTE — Progress Notes (Signed)
Radiation Oncology Procedure Note  Name: Sean Wood.  MRN: [id number]  Date:   06/24/2015     DOB: 1936/03/30   This 79 y.o. male patient presents to the hospital for I-125 interstitial implant for salvage.  CHIEF COMPLAINT: Prostate cancer  DIAGNOSIS: Prostate cancer  HPI: Patient is a 79 year old male well-known to department treated back in 2009 with IM RT radiation therapy for Gleason 7 (3+4) adenocarcinoma the prostate. He started having rising PSA in 2013 most recent PSA climbed and 11 range he had biopsy about 2 years ago positive for adenocarcinoma. He has been offered Lupron therapy although has refused. He did accept salvage I-125 interstitial implant which was performed today. 2 PHYSICAL EXAM: Well-developed well-nourished patient in NAD. HEENT reveals PERLA, EOMI, discs not visualized.  Oral cavity is clear. No oral mucosal lesions are identified. Neck is clear without evidence of cervical or supraclavicular adenopathy. Lungs are clear to A&P. Cardiac examination is essentially unremarkable with regular rate and rhythm without murmur rub or thrill. Abdomen is benign with no organomegaly or masses noted. Motor sensory and DTR levels are equal and symmetric in the upper and lower extremities. Cranial nerves II through XII are grossly intact. Proprioception is intact. No peripheral adenopathy or edema is identified. No motor or sensory levels are noted. Crude visual fields are within normal range.   BP Readings from Last 1 Encounters:  06/24/15 105/68   Pulse Readings from Last 1 Encounters:  06/24/15 56   Wt Readings from Last 1 Encounters:  06/16/15 143 lb (64.864 kg)     PROCEDURE: patient was taken to the operating room and general anesthesia was administered. Legs were immobilized in stirrups and patient was positioned in the exact same proportions as original volume study. Patient was prepped and Foley catheter was placed. Ultrasound guidance identified the prostate  and recreated the original set up as per treatment planning volume study. 16 needles were placed under ultrasound guidance with PVCs delivered to the prostate volume. After completion of procedure cystoscopy was performed by urology and no evidence of seeds in the bladder were noted. Patient tolerated the procedure extremely well. Initial plain film as doublecheck identified 50 seeds in the prostate. Patient has followup appointment in one month for CT scan for quality assurance will be performed.   PLAN: Patient has follow-up in one month for CT scan for quality assurance. He knows already to contact urology or our office for any concerns or complaints. Will start looking at PSA in about 4 months from now.  I would like to take this opportunity for allowing me to participate in the care of your patient.Armstead Peaks., MD

## 2015-06-24 NOTE — Discharge Instructions (Addendum)
Brachytherapy for Prostate Cancer Brachytherapy for prostate cancer is radiation treatment given from the inside of the prostate instead of from the outside by an external beam. There are two types of brachytherapy:  Low-dose-rate therapy. This involves seed or pellet implantation.  High-dose-rate therapy. This is given by thin tubes that contain radioactive material. When the seed method (also called implant therapy) is used, 80 to 120 little pellets are placed into the prostate and left in place. Because the radiation does not travel far from the prostate, healthy, noncancerous tissues around the prostate receive only a small dose of radiation, which helps protect them from injury. The radiation fades away over the next few months. If the high-dose therapy is used, the thin tubes of radiation are removed after the treatment and there is no radiation left in the prostate when you leave the hospital. This type of treatment is followed by a course of radiation by an external beam method on an outpatient basis. LET [facility] 2 CARE PROVIDER KNOW ABOUT:   Any allergies you have.  All medicines you are taking, including blood thinners, vitamins, herbs, eye drops, creams, and over-the-counter medicines.  Previous problems you or members of your family have had with the use of anesthetics.  Any blood disorders you have had.  Previous surgeries you have had.  Medical conditions you have. RISKS AND COMPLICATIONS  Generally, brachytherapy for prostate cancer is a safe procedure. However, problems can occur.  Possible short-term problems include:  Difficulty passing urine. You may need a catheter for a few days to a month.  Blood in the urine or semen.  A feeling of constipation because of prostate swelling.  Frequent feeling of an urgent need to urinate. Possible long-term problems include:  Inflammation of the rectum. This happens in about 2% of people who have the procedure.  Erection  problems. These vary with age and occur in about 15-40% of men.  Difficulty urinating. This is caused by scarring in the urethra.  Diarrhea. BEFORE THE PROCEDURE  You will be scheduled for some tests, which may include an ultrasound, CT scan, or MRI. These tests do not hurt. A specialist in radiation treatment will meet with you and will decide which type of brachytherapy to use based on these imaging tests. PROCEDURE   You will be given a medicine that makes you fall asleep (general anesthetic).  A small probe will be placed in the rectum.  Ultrasound waves will be used to guide the insertion of small tubes into the prostate in preplanned locations.  If the seed method (low-dose) is used:  Small pellets the size of a rice grain will be placed into the tube.  The tube will then be removed, leaving the seeds in the prostate.  If the high-dose method is used:  Radioactive wires will be inserted and left in until a specific dose of radiation has been given.  The wires will then be removed.  In both methods, a catheter is usually placed into the bladder. AFTER THE PROCEDURE  Follow-up depends on the type of treatment given. With the high-dose treatment, there is no radiation risk after the treatment. Usually additional treatment with external beam radiation will be provided. This will require a series of visits back to the clinic as an outpatient.  With the seed implant, some physicians recommend that close contact with children and pregnant women be limited because of the low dose of radiation still in the prostate. That radiation will be almost gone by 2  months, and by 4-6 months the levels will be almost undetectable.   Brachytherapy for Prostate Cancer, Care After Refer to this sheet in the next few weeks. These instructions provide you with information on caring for yourself after your procedure. Your health care provider may also give you more specific instructions. Your treatment  has been planned according to current medical practices, but problems sometimes occur. Call your health care provider if you have any problems or questions after your procedure. WHAT TO EXPECT AFTER THE PROCEDURE The area behind the scrotum will probably be tender and bruised. For a short period of time you may have:  Difficulty passing urine. You may need a catheter for a few days to a month.  Blood in the urine or semen.  A feeling of constipation because of prostate swelling.  Frequent feeling of an urgent need to urinate. For a long period of time you may have:  Inflammation of the rectum. This happens in about 2% of people who have the procedure.  Erection problems. These vary with age and occur in about 15-40% of men.  Difficulty urinating. This is caused by scarring in the urethra.  Diarrhea. HOME CARE INSTRUCTIONS   Take medicines only as directed by your health care provider.  You may have a catheter in your bladder for several days. You will have blood in the urine bag and should drink a lot of fluids to keep it a light red color.  Keep all follow-up visits as directed by your health care provider. If you have a catheter, it will be removed during one of these visits.  Try not to sit directly on the area behind the scrotum. A soft cushion can decrease the discomfort. Ice packs may also be helpful for the discomfort. Do not put ice directly on the skin.  Shower and wash the area behind the scrotum gently. Do not sit in a tub.  If you have had the brachytherapy that uses the seeds, limit your close contact with children and pregnant women for 2 months because of the radiation still in the prostate. After that period of time, the levels drop off quickly. SEEK IMMEDIATE MEDICAL CARE IF:   You have a fever.  You have chills.  You have shortness of breath.  You have chest pain.  You have thick blood, like tomato juice, in the urine bag.  Your catheter is blocked so  urine cannot get into the bag. Your bladder area or lower abdomen may be swollen.  There is excessive bleeding from your rectum. It is normal to have a little blood mixed with your stool.  There is severe discomfort in the treated area that does not go away with pain medicine. AMBULATORY SURGERY  DISCHARGE INSTRUCTIONS   The drugs that you were given will stay in your system until tomorrow so for the next 24 hours you should not:  Drive an automobile Make any legal decisions Drink any alcoholic beverage   You may resume regular meals tomorrow.  Today it is better to start with liquids and gradually work up to solid foods.  You may eat anything you prefer, but it is better to start with liquids, then soup and crackers, and gradually work up to solid foods.   Please notify your doctor immediately if you have any unusual bleeding, trouble breathing, redness and pain at the surgery site, drainage, fever, or pain not relieved by medication.    Additional Instructions:        Please  contact your physician with any problems or Same Day Surgery at [phone], Monday through Friday 6 am to 4 pm, or [place] at [facility] number at [phone].AMBULATORY SURGERY  DISCHARGE INSTRUCTIONS   The drugs that you were given will stay in your system until tomorrow so for the next 24 hours you should not:  Drive an automobile Make any legal decisions Drink any alcoholic beverage   You may resume regular meals tomorrow.  Today it is better to start with liquids and gradually work up to solid foods.  You may eat anything you prefer, but it is better to start with liquids, then soup and crackers, and gradually work up to solid foods.   Please notify your doctor immediately if you have any unusual bleeding, trouble breathing, redness and pain at the surgery site, drainage, fever, or pain not relieved by medication.    Additional Instructions:        Please contact your  physician with any problems or Same Day Surgery at [phone], Monday through Friday 6 am to 4 pm, or [place] at [facility] number at [phone].AMBULATORY SURGERY  DISCHARGE INSTRUCTIONS   The drugs that you were given will stay in your system until tomorrow so for the next 24 hours you should not:  Drive an automobile Make any legal decisions Drink any alcoholic beverage   You may resume regular meals tomorrow.  Today it is better to start with liquids and gradually work up to solid foods.  You may eat anything you prefer, but it is better to start with liquids, then soup and crackers, and gradually work up to solid foods.   Please notify your doctor immediately if you have any unusual bleeding, trouble breathing, redness and pain at the surgery site, drainage, fever, or pain not relieved by medication.    Additional Instructions:        Please contact your physician with any problems or Same Day Surgery at [phone], Monday through Friday 6 am to 4 pm, or [place] at [facility] number at [phone].AMBULATORY SURGERY  DISCHARGE INSTRUCTIONS   The drugs that you were given will stay in your system until tomorrow so for the next 24 hours you should not:  Drive an automobile Make any legal decisions Drink any alcoholic beverage   You may resume regular meals tomorrow.  Today it is better to start with liquids and gradually work up to solid foods.  You may eat anything you prefer, but it is better to start with liquids, then soup and crackers, and gradually work up to solid foods.   Please notify your doctor immediately if you have any unusual bleeding, trouble breathing, redness and pain at the surgery site, drainage, fever, or pain not relieved by medication.    Additional Instructions:         Please contact your physician with any problems or Same Day Surgery at [phone], Monday through Friday 6 am to 4 pm, or [place] at  [facility] number at [phone].You have abdominal discomfort.  You have severe nausea or vomiting.  You develop any new or unusual symptoms.

## 2015-07-22 ENCOUNTER — Ambulatory Visit
Admission: RE | Admit: 2015-07-22 | Discharge: 2015-07-22 | Disposition: A | Discharge status: Home / Self Care | Payer: PPO | Source: Ambulatory Visit | Attending: Radiation Oncology | Admitting: Radiation Oncology

## 2015-07-22 ENCOUNTER — Other Ambulatory Visit: Payer: Self-pay | Admitting: *Deleted

## 2015-07-22 ENCOUNTER — Encounter: Payer: Self-pay | Admitting: Radiation Oncology

## 2015-07-22 VITALS — BP 118/64 | HR 59 | Temp 94.8°F | Resp 18 | Wt 148.0 lb

## 2015-07-22 DIAGNOSIS — C61 Malignant neoplasm of prostate: Secondary | ICD-10-CM

## 2015-07-22 DIAGNOSIS — Z510 Encounter for antineoplastic radiation therapy: Secondary | ICD-10-CM | POA: Diagnosis not present

## 2015-07-22 NOTE — Progress Notes (Signed)
Radiation Oncology Follow up Note  Name: Sean Wood.   Date:   07/22/2015 MRN:  [id number] DOB: 19-Jun-1936    This 79 y.o. male presents to the clinic today for follow-up for I-125 interstitial implant.  REFERRING PROVIDER: Margo Common, PA  HPI: patient is a 79 year old male now 1 month out having completed I-125 interstitial implant for adenocarcinoma the prostate. He was originally treated in 2009 with IM RT for Gleason 7 (3+4) adenocarcinoma. In 2013 started having biochemical failure had repeat biopsy which was again positive for adenocarcinoma. He opted to go ahead with salvage I-125 interstitial implant. He is seen today in routine follow-up and is doing well. Has some loose stools but does not follow any regular diet does not use Imodium. He does eat a lot of bran which I've advised him against at this time. No frequency or urgency of urination..  COMPLICATIONS OF TREATMENT: none  FOLLOW UP COMPLIANCE: keeps appointments   PHYSICAL EXAM:  BP 118/64 mmHg  Pulse 59  Temp(Src) 94.8 F (34.9 C)  Resp 18  Wt 148 lb 0.6 oz (67.15 kg) On rectal exam prostate is firm no evidence of nodularity or mass is markedly shrunken size. No other rectal abnormality is identified. Well-developed well-nourished patient in NAD. HEENT reveals PERLA, EOMI, discs not visualized.  Oral cavity is clear. No oral mucosal lesions are identified. Neck is clear without evidence of cervical or supraclavicular adenopathy. Lungs are clear to A&P. Cardiac examination is essentially unremarkable with regular rate and rhythm without murmur rub or thrill. Abdomen is benign with no organomegaly or masses noted. Motor sensory and DTR levels are equal and symmetric in the upper and lower extremities. Cranial nerves II through XII are grossly intact. Proprioception is intact. No peripheral adenopathy or edema is identified. No motor or sensory levels are noted. Crude visual fields are within normal  range.   RADIOLOGY RESULTS: CT scan for quality assurance of seed implantation is reviewed and shows excellent placement of sources  PLAN: this time patient is doing well I've explained to him again our PSA protocol on like this see a PSA in about 4 months and I've asked to see him back in follow-up at that time. He otherwise continues to do well I've asked him to cut down on program products also his ensure to try to solidify more his stools. Patient is to call sooner with any concerns.  I would like to take this opportunity for allowing me to participate in the care of your patient.Armstead Peaks., MD

## 2015-08-08 ENCOUNTER — Encounter: Payer: Self-pay | Admitting: Family Medicine

## 2015-08-08 ENCOUNTER — Ambulatory Visit (INDEPENDENT_AMBULATORY_CARE_PROVIDER_SITE_OTHER): Payer: PPO | Admitting: Family Medicine

## 2015-08-08 VITALS — BP 106/64 | HR 67 | Temp 98.7°F | Resp 16 | Wt 147.8 lb

## 2015-08-08 DIAGNOSIS — J441 Chronic obstructive pulmonary disease with (acute) exacerbation: Secondary | ICD-10-CM | POA: Diagnosis not present

## 2015-08-08 DIAGNOSIS — J069 Acute upper respiratory infection, unspecified: Secondary | ICD-10-CM

## 2015-08-08 DIAGNOSIS — E7800 Pure hypercholesterolemia, unspecified: Secondary | ICD-10-CM | POA: Insufficient documentation

## 2015-08-08 DIAGNOSIS — L409 Psoriasis, unspecified: Secondary | ICD-10-CM | POA: Insufficient documentation

## 2015-08-08 DIAGNOSIS — J439 Emphysema, unspecified: Secondary | ICD-10-CM | POA: Insufficient documentation

## 2015-08-08 MED ORDER — AMOXICILLIN-POT CLAVULANATE 875-125 MG PO TABS: 1.0000 | ORAL_TABLET | Freq: Two times a day (BID) | ORAL | Status: DC

## 2015-08-08 NOTE — Progress Notes (Signed)
Patient ID: Sean Fitz., male   DOB: 05/20/1936, 79 y.o.   MRN: [id number]       Patient: Sean Wood. Male    DOB: 1936-02-18   79 y.o.   MRN: [id number] Visit Date: 08/08/2015  Today's Provider: [name], PA   Chief Complaint  Patient presents with  . Cough    started yesterday, Thrursday evening  . Sinusitis    started Monday or Tuesday   Subjective:    Cough This is a new problem. The current episode started yesterday. The problem has been unchanged. The problem occurs every few hours. The cough is productive of sputum. Associated symptoms include chills, headaches, nasal congestion, a sore throat, shortness of breath and sweats. Pertinent negatives include no chest pain, ear congestion, ear pain, fever, heartburn or wheezing. His past medical history is significant for emphysema. Dx in 1983  Sinusitis This is a new problem. The current episode started in the past 7 days. The problem is unchanged. There has been no fever. His pain is at a severity of 0/10. He is experiencing no pain. Associated symptoms include chills, congestion, coughing, diaphoresis, headaches, shortness of breath, sneezing and a sore throat. Pertinent negatives include no ear pain or sinus pressure. Treatments tried: tried whiskey with Karo syrup last night. and tried some Robitussin   History of prostate cancer Dr. Baruch Gouty put in radioactive implants  06-24-15. Concerned about recent cough and fever last night with chills (did not take temperature to document).  Patient Active Problem List   Diagnosis Date Noted  . Acute exacerbation of chronic obstructive airways disease 08/08/2015  . Chronic obstructive pulmonary emphysema 08/08/2015  . Hypercholesteremia 08/08/2015  . Psoriasis 08/08/2015  . Abnormal prostate specific antigen 06/01/2013  . CA of prostate 06/01/2013  . History of colon polyps 04/02/2010  . H/O malignant neoplasm of prostate 04/02/2010   Past Surgical History    Procedure Laterality Date  . Back surgery  1973, 1987  . Hernia repair      left and right inguinal hernia repair  . Radioactive seed implant N/A 06/24/2015    Procedure: RADIOACTIVE SEED IMPLANT/BRACHYTHERAPY IMPLANT;  Surgeon: Hollice Espy, MD;  Location: [facility] ORS;  Service: Urology;  Laterality: N/A;  . Cystoscopy  06/24/2015    Procedure: CYSTOSCOPY;  Surgeon: Hollice Espy, MD;  Location: [facility] ORS;  Service: Urology;;   Family History  Problem Relation Age of Onset  . Cancer Brother   . Heart disease Brother   . Diabetes Brother   . Emphysema Brother   . Diabetes Sister   . Emphysema Mother   . Healthy Sister   . Healthy Sister   . Healthy Sister   . Diabetes Brother   . Diabetes Brother   . Diabetes Brother   . Healthy Brother      Allergies  Allergen Reactions  . Mevacor [Lovastatin] Other (See Comments)    "feel washed out"  . Niacin And Related Other (See Comments)    "hot feeling"  . Statins Other (See Comments)    "feel washed out"  . Aleve [Naproxen Sodium] Rash   Previous Medications   ALBUTEROL (PROAIR HFA) 108 (90 BASE) MCG/ACT INHALER       CHOLECALCIFEROL (VITAMIN D-3) 1000 UNITS CAPS    Take 1 capsule by mouth daily.   COENZYME Q10 (COQ-10) 100 MG CAPS    Take 100 mg by mouth.   MAGNESIUM 400 MG TABS    Take 1 tablet by mouth  daily.   RED YEAST RICE 600 MG TABS    Take 2 tablets by mouth at bedtime.    TAMSULOSIN (FLOMAX) 0.4 MG CAPS CAPSULE    Take 1 capsule (0.4 mg total) by mouth daily.    Review of Systems  Constitutional: Positive for chills and diaphoresis. Negative for fever.  HENT: Positive for congestion, sneezing and sore throat. Negative for ear pain and sinus pressure.   Respiratory: Positive for cough and shortness of breath. Negative for wheezing.   Cardiovascular: Negative for chest pain.  Gastrointestinal: Negative for heartburn.  Neurological: Positive for headaches.    Social History  Substance Use Topics  . Smoking status:  Former Smoker -- 1.50 packs/day    Types: Cigarettes  . Smokeless tobacco: Current User    Types: Chew  . Alcohol Use: No   Objective:   BP 106/64 mmHg  Pulse 67  Temp(Src) 98.7 F (37.1 C) (Oral)  Resp 16  Wt 147 lb 12.8 oz (67.042 kg)  SpO2 94%  Physical Exam  Constitutional: He is oriented to person, place, and time. He appears well-developed and well-nourished. No distress.  HENT:  Head: Normocephalic and atraumatic.  Right Ear: Hearing and external ear normal.  Left Ear: Hearing and external ear normal.  Nose: Nose normal.  Left TM reddened with old perforation. No drainage. 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  Eyes: Conjunctivae, EOM and lids are normal. Pupils are equal, round, and reactive to light. Right eye exhibits no discharge. Left eye exhibits no discharge. No scleral icterus.  Cardiovascular: Normal rate and regular rhythm.   Pulmonary/Chest: Effort normal. No respiratory distress.  Coarse breath sounds.  Abdominal: Soft. Bowel sounds are normal.  Musculoskeletal: Normal range of motion.  Neurological: He is alert and oriented to person, place, and time.  Skin: Skin is intact. No lesion and no rash noted.  Psychiatric: He has a normal mood and affect. His speech is normal and behavior is normal. Thought content normal.      Assessment & Plan:     1. Upper respiratory infection Onset over the past week with rhinorrhea or sneezing. Started having some fever yesterday. May use tylenol or Aleve prn. Increase fluid intake and recheck in 7-10 days if needed.  2. Acute exacerbation of chronic obstructive airways disease Onset with URI symptoms. May use Robitussin-DM and add antibiotic. Recheck if needed in 7-10 days.  - amoxicillin-clavulanate (AUGMENTIN) 875-125 MG per tablet; Take 1 tablet by mouth 2 (two) times daily.  Dispense: 20 tablet; Refill: 0       [name], PA  [facility]

## 2015-10-22 IMAGING — RF DG ABDOMEN 1V
1 series · 1 of 1 positions shown · non-contrast
Comparison: none

[Series 1: retrogr.  pyelogr. · 1 of 1 slices shown]
[im 1/1]
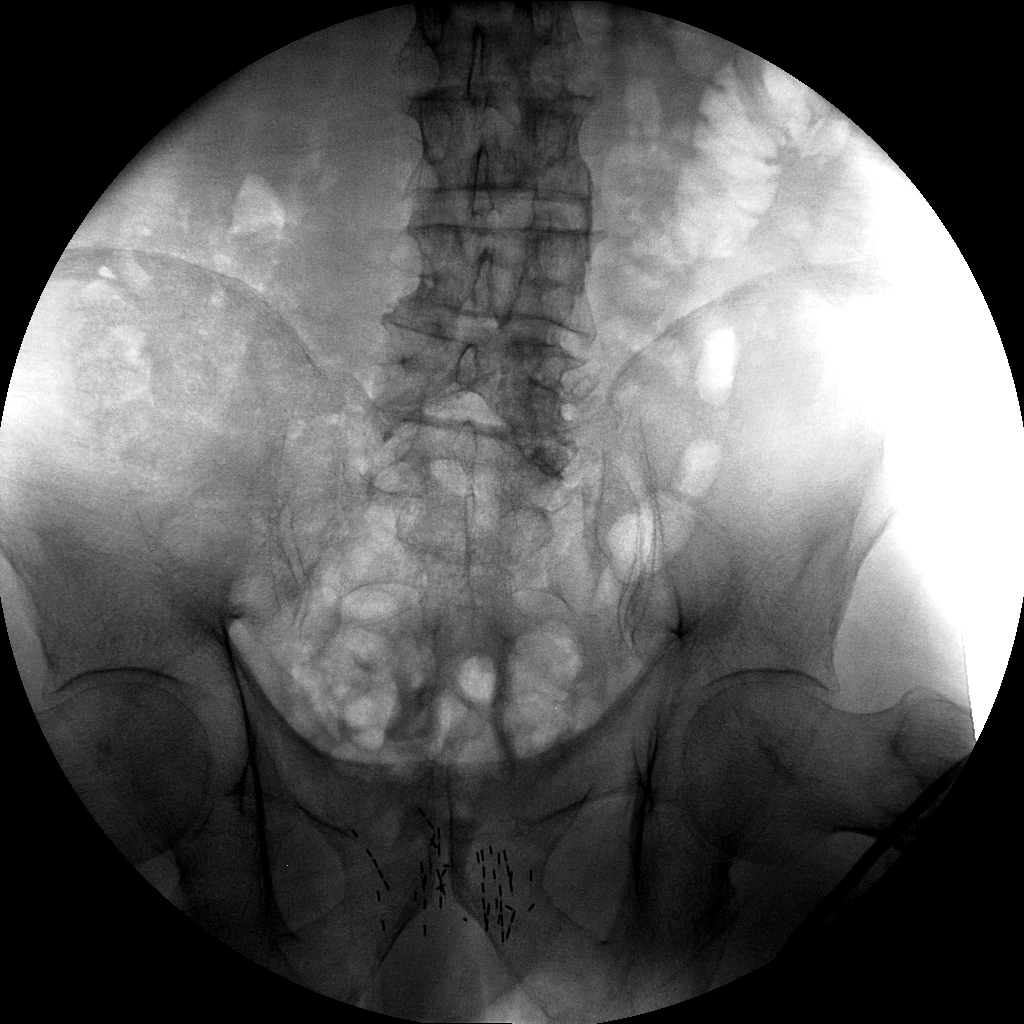

[1 of 1 positions shown; findings below may reference images not displayed]

Canned report from images found in remote index.

Refer to host system for actual result text.

## 2015-11-24 ENCOUNTER — Inpatient Hospital Stay: Payer: PPO | Attending: Radiation Oncology

## 2015-11-24 ENCOUNTER — Ambulatory Visit
Admission: RE | Admit: 2015-11-24 | Discharge: 2015-11-24 | Disposition: A | Discharge status: Home / Self Care | Payer: PPO | Source: Ambulatory Visit | Attending: Radiation Oncology | Admitting: Radiation Oncology

## 2015-11-24 ENCOUNTER — Encounter: Payer: Self-pay | Admitting: Radiation Oncology

## 2015-11-24 ENCOUNTER — Other Ambulatory Visit: Payer: Self-pay | Admitting: *Deleted

## 2015-11-24 VITALS — BP 114/65 | HR 64 | Temp 95.8°F | Resp 18 | Wt 153.4 lb

## 2015-11-24 DIAGNOSIS — C61 Malignant neoplasm of prostate: Secondary | ICD-10-CM

## 2015-11-24 DIAGNOSIS — R972 Elevated prostate specific antigen [PSA]: Secondary | ICD-10-CM | POA: Insufficient documentation

## 2015-11-24 DIAGNOSIS — Z79818 Long term (current) use of other agents affecting estrogen receptors and estrogen levels: Secondary | ICD-10-CM | POA: Diagnosis not present

## 2015-11-24 LAB — PSA: PSA: 5.76 ng/mL — ABNORMAL HIGH (ref 0.00–4.00)

## 2015-11-24 NOTE — Progress Notes (Signed)
Radiation Oncology Follow up Note  Name: Sean Wood.   Date:   11/24/2015 MRN:  [id number] DOB: 09-11-1936    This 79 y.o. male presents to the clinic today for follow-up for salvage I-125 interstitial implant.  REFERRING PROVIDER: Margo Common, PA  HPI: Patient is a 79 year old male originally treated 2009 with external beam I MRT radiation therapy for Gleason 7 (3+4) adenocarcinoma started having biochemical failure now seen out 5 months having completed I-125 interstitial implant for salvage. He is doing well specifically denies any lower urinary tract symptoms diarrhea. Also does not complain of any bone pain..  COMPLICATIONS OF TREATMENT: none  FOLLOW UP COMPLIANCE: keeps appointments   PHYSICAL EXAM:  BP 114/65 mmHg  Pulse 64  Temp(Src) 95.8 F (35.4 C)  Resp 18  Wt 153 lb 7 oz (69.6 kg) On rectal exam rectal sphincter tone is good. Prostate is smooth contracted without evidence of nodularity or mass. Sulcus is preserved bilaterally. No discrete nodularity is identified. No other rectal abnormalities are noted. Well-developed well-nourished patient in NAD. HEENT reveals PERLA, EOMI, discs not visualized.  Oral cavity is clear. No oral mucosal lesions are identified. Neck is clear without evidence of cervical or supraclavicular adenopathy. Lungs are clear to A&P. Cardiac examination is essentially unremarkable with regular rate and rhythm without murmur rub or thrill. Abdomen is benign with no organomegaly or masses noted. Motor sensory and DTR levels are equal and symmetric in the upper and lower extremities. Cranial nerves II through XII are grossly intact. Proprioception is intact. No peripheral adenopathy or edema is identified. No motor or sensory levels are noted. Crude visual fields are within normal range.  RADIOLOGY RESULTS: No current films for review  PLAN: Present time he is doing well I've run a PSA level on him today and will report that separately.  Otherwise I'm please was overall progress. I've asked to see him back in 6 months for follow-up. Patient is to call sooner with any concerns.  I would like to take this opportunity for allowing me to participate in the care of your patient.Armstead Peaks., MD

## 2015-12-01 ENCOUNTER — Telehealth: Payer: Self-pay | Admitting: *Deleted

## 2015-12-01 ENCOUNTER — Other Ambulatory Visit: Payer: Self-pay | Admitting: *Deleted

## 2015-12-01 DIAGNOSIS — C61 Malignant neoplasm of prostate: Secondary | ICD-10-CM

## 2015-12-01 NOTE — Telephone Encounter (Signed)
Dr. Baruch Wood ordered Lupron injection for Mr. Sean Wood.  Mr. Sean Wood given information about Lupron injection and appointment date and time.  It is scheduled for 12/04/15 at 9:30.  Mr. Sean Wood verbalized understanding and all questions answered.

## 2015-12-04 ENCOUNTER — Inpatient Hospital Stay: Payer: PPO

## 2015-12-04 DIAGNOSIS — C61 Malignant neoplasm of prostate: Secondary | ICD-10-CM

## 2015-12-04 MED ORDER — LEUPROLIDE ACETATE (4 MONTH) 30 MG IM KIT
30.0000 mg | PACK | Freq: Once | INTRAMUSCULAR | Status: AC
  Administered 2015-12-04: 30 mg via INTRAMUSCULAR
  Filled 2015-12-04: qty 30

## 2016-02-06 ENCOUNTER — Ambulatory Visit (INDEPENDENT_AMBULATORY_CARE_PROVIDER_SITE_OTHER): Payer: PPO | Admitting: Family Medicine

## 2016-02-06 ENCOUNTER — Encounter: Payer: Self-pay | Admitting: Family Medicine

## 2016-02-06 VITALS — BP 108/68 | HR 77 | Temp 98.1°F | Resp 18 | Wt 145.8 lb

## 2016-02-06 DIAGNOSIS — J101 Influenza due to other identified influenza virus with other respiratory manifestations: Secondary | ICD-10-CM

## 2016-02-06 LAB — POC INFLUENZA A&B (BINAX/QUICKVUE)
INFLUENZA B, POC: NEGATIVE
Influenza A, POC: POSITIVE — AB

## 2016-02-06 MED ORDER — HYDROCODONE-HOMATROPINE 5-1.5 MG/5ML PO SYRP: 5.0000 mL | ORAL_SOLUTION | Freq: Three times a day (TID) | ORAL | Status: DC | PRN

## 2016-02-06 MED ORDER — OSELTAMIVIR PHOSPHATE 75 MG PO CAPS: 75.0000 mg | ORAL_CAPSULE | Freq: Two times a day (BID) | ORAL | Status: DC

## 2016-02-06 NOTE — [place] Notes (Signed)
Patient ID: [name], male   DOB: 1936-09-14, 80 y.o.   MRN: [id number]   Patient: Sean Wood. Male    DOB: 04-Dec-1936   80 y.o.   MRN: [id number] Visit Date: 02/06/2016  Today's Provider: [name], PA   Chief Complaint  Patient presents with  . URI   Subjective:    URI  This is a new problem. Episode onset: three days ago. The problem has been unchanged. Associated symptoms include congestion, coughing and rhinorrhea. Associated symptoms comments: Weakness, body aches, sore throat on Wednesday. He has tried nothing for the symptoms.   Patient Active Problem List   Diagnosis Date Noted  . Acute exacerbation of chronic obstructive airways disease ([place]) 08/08/2015  . 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[place]) 08/08/2015  . Hypercholesteremia 08/08/2015  . Psoriasis 08/08/2015  . Abnormal prostate specific antigen 06/01/2013  . CA of prostate ([place]) 06/01/2013  . Elevated prostate specific antigen (PSA) 06/01/2013  . Malignant neoplasm of prostate ([place]) 06/01/2013  . History of colon polyps 04/02/2010  . H/O malignant neoplasm of prostate 04/02/2010   Past Surgical History  Procedure Laterality Date  . Back surgery  1973, 1987  . Hernia repair      left and right inguinal hernia repair  . Radioactive seed implant N/A 06/24/2015    Procedure: RADIOACTIVE SEED IMPLANT/BRACHYTHERAPY IMPLANT;  Surgeon: Hollice Espy, MD;  Location: [facility];  Service: Urology;  Laterality: N/A;  . Cystoscopy  06/24/2015    Procedure: CYSTOSCOPY;  Surgeon: Hollice Espy, MD;  Location: [facility];  Service: Urology;;   Family History  Problem Relation Age of Onset  . Cancer Brother   . Heart disease Brother   . Diabetes Brother   . Emphysema Brother   . Diabetes Sister   . Emphysema Mother   . Healthy Sister   . Healthy Sister   . Healthy Sister   . Diabetes Brother   . Diabetes Brother   . Diabetes Brother   . Healthy Brother      Previous Medications   ALBUTEROL (PROAIR HFA) 108 (90 BASE) MCG/ACT INHALER    Reported on 02/06/2016   CHOLECALCIFEROL (VITAMIN D-3) 1000 UNITS CAPS    Take 1 capsule by mouth daily.   COENZYME Q10 (COQ-10) 100 MG CAPS    Take 100 mg by mouth. Reported on 02/06/2016   MAGNESIUM 400 MG TABS    Take 1 tablet by mouth daily.   RED YEAST RICE 600 MG TABS    Take 2 tablets by mouth at bedtime.    TAMSULOSIN (FLOMAX) 0.4 MG CAPS CAPSULE    Take 1 capsule (0.4 mg total) by mouth daily.   Allergies  Allergen Reactions  . Mevacor [Lovastatin] Other (See Comments)    "feel washed out"  . Niacin And Related Other (See Comments)    "hot feeling"  . Statins Other (See Comments)    "feel washed out"  . Aleve [Naproxen Sodium] Rash    Review of Systems  Constitutional:       Body aches  HENT: Positive for congestion and rhinorrhea.   Eyes: Negative.   Respiratory: Positive for cough.   Cardiovascular: Negative.   Gastrointestinal: Negative.   Endocrine: Negative.   Genitourinary: Negative.   Musculoskeletal: Negative.   Skin: Negative.   Allergic/Immunologic: Negative.   Neurological: Positive for weakness.  Hematological: Negative.   Psychiatric/Behavioral: Negative.     Social History  Substance Use Topics  . Smoking status: Former Smoker --  1.50 packs/day    Types: Cigarettes  . Smokeless tobacco: Current User    Types: Chew  . Alcohol Use: No   Objective:   BP 108/68 mmHg  Pulse 77  Temp(Src) 98.1 F (36.7 C) (Oral)  Resp 18  Wt 145 lb 12.8 oz (66.134 kg)  SpO2 96%  Physical Exam  Constitutional: He is oriented to person, place, and time. He appears well-developed and well-nourished. No distress.  HENT:  Head: Normocephalic and atraumatic.  Right Ear: Hearing and external ear normal.  Left Ear: Hearing and external ear normal.  Nose: Nose normal.  Eyes: Conjunctivae, EOM and lids are normal. Right eye exhibits no discharge. Left eye exhibits no discharge. No scleral icterus.  Neck: Neck  supple.  Cardiovascular: Normal rate, regular rhythm and normal heart sounds.   Pulmonary/Chest: Effort normal and breath sounds normal. No respiratory distress.  Abdominal: Soft. Bowel sounds are normal.  Musculoskeletal: Normal range of motion.  Neurological: He is alert and oriented to person, place, and time.  Skin: Skin is intact. No lesion and no rash noted.  Psychiatric: He has a normal mood and affect. His speech is normal and behavior is normal. Thought content normal.      Assessment & Plan:      1. Influenza A Onset 3 days ago with body aches, fatigue, cough, sore throat and rhinorrhea. Had some GI upset initially (vomited couple times). Flu test positive for influenza A today. Will treat with Tamiflu and Hycodan. Increase fluid intake and may use Tylenol prn aches or pains. Recheck if no better in 5-7 days. Home to rest. - POC Influenza A&B (Binax test) - HYDROcodone-homatropine (HYCODAN) 5-1.5 MG/5ML syrup; Take 5 mLs by mouth every 8 (eight) hours as needed for cough.  Dispense: 120 mL; Refill: 0 - oseltamivir (TAMIFLU) 75 MG capsule; Take 1 capsule (75 mg total) by mouth 2 (two) times daily.  Dispense: 10 capsule; Refill: 0

## 2016-02-06 NOTE — Patient Instructions (Signed)

## 2016-03-09 DIAGNOSIS — R0600 Dyspnea, unspecified: Secondary | ICD-10-CM | POA: Diagnosis not present

## 2016-03-09 DIAGNOSIS — M150 Primary generalized (osteo)arthritis: Secondary | ICD-10-CM | POA: Diagnosis not present

## 2016-03-09 DIAGNOSIS — Z79899 Other long term (current) drug therapy: Secondary | ICD-10-CM | POA: Diagnosis not present

## 2016-03-09 DIAGNOSIS — M5030 Other cervical disc degeneration, unspecified cervical region: Secondary | ICD-10-CM | POA: Diagnosis not present

## 2016-03-09 DIAGNOSIS — M0589 Other rheumatoid arthritis with rheumatoid factor of multiple sites: Secondary | ICD-10-CM | POA: Diagnosis not present

## 2016-03-09 DIAGNOSIS — M810 Age-related osteoporosis without current pathological fracture: Secondary | ICD-10-CM | POA: Diagnosis not present

## 2016-03-12 ENCOUNTER — Other Ambulatory Visit: Payer: Self-pay | Admitting: *Deleted

## 2016-03-12 DIAGNOSIS — C61 Malignant neoplasm of prostate: Secondary | ICD-10-CM

## 2016-03-29 ENCOUNTER — Ambulatory Visit
Admission: RE | Admit: 2016-03-29 | Discharge: 2016-03-29 | Disposition: A | Discharge status: Home / Self Care | Payer: PPO | Source: Ambulatory Visit | Attending: Family Medicine | Admitting: Family Medicine

## 2016-03-29 ENCOUNTER — Ambulatory Visit (INDEPENDENT_AMBULATORY_CARE_PROVIDER_SITE_OTHER): Payer: PPO | Admitting: Family Medicine

## 2016-03-29 ENCOUNTER — Encounter: Payer: Self-pay | Admitting: Family Medicine

## 2016-03-29 ENCOUNTER — Other Ambulatory Visit: Payer: Self-pay | Admitting: Family Medicine

## 2016-03-29 VITALS — BP 124/62 | HR 90 | Temp 99.2°F | Resp 18 | Wt 148.0 lb

## 2016-03-29 DIAGNOSIS — J439 Emphysema, unspecified: Secondary | ICD-10-CM | POA: Insufficient documentation

## 2016-03-29 DIAGNOSIS — R0600 Dyspnea, unspecified: Secondary | ICD-10-CM

## 2016-03-29 DIAGNOSIS — Z8546 Personal history of malignant neoplasm of prostate: Secondary | ICD-10-CM | POA: Diagnosis not present

## 2016-03-29 DIAGNOSIS — J449 Chronic obstructive pulmonary disease, unspecified: Secondary | ICD-10-CM | POA: Insufficient documentation

## 2016-03-29 DIAGNOSIS — J029 Acute pharyngitis, unspecified: Secondary | ICD-10-CM | POA: Diagnosis not present

## 2016-03-29 DIAGNOSIS — R0602 Shortness of breath: Secondary | ICD-10-CM | POA: Diagnosis not present

## 2016-03-29 DIAGNOSIS — R5381 Other malaise: Secondary | ICD-10-CM

## 2016-03-29 DIAGNOSIS — J189 Pneumonia, unspecified organism: Secondary | ICD-10-CM

## 2016-03-29 DIAGNOSIS — R5383 Other fatigue: Secondary | ICD-10-CM

## 2016-03-29 DIAGNOSIS — R918 Other nonspecific abnormal finding of lung field: Secondary | ICD-10-CM | POA: Diagnosis not present

## 2016-03-29 LAB — POCT RAPID STREP A (OFFICE): Rapid Strep A Screen: NEGATIVE

## 2016-03-29 MED ORDER — LEVOFLOXACIN 500 MG PO TABS: 500.0000 mg | ORAL_TABLET | Freq: Every day | ORAL | Status: DC

## 2016-03-29 NOTE — [place] Notes (Signed)
Patient ID: Sean Fitz., male   DOB: 1936/01/24, 80 y.o.   MRN: [id number]   Sean Fitz.  MRN: [id number] DOB: 05/11/1936  Subjective:  HPI   The patient is a 80 year old male who presents for evaluation of cold symptoms.  He states he was in 1 month ago with similar symptoms.  He states he had gotten better but then about 1 week ago he started having cough, shortness of breath, runny nose, post nasal drainage and a little sore throat.  He denies any fever or wheezing.  He also states that he has been weak since being seen the last visit (02-06-16 diagnosed with Influenza A and treated with Tamiflu and Hycodan cough syrup).    Patient Active Problem List   Diagnosis Date Noted  . Acute exacerbation of chronic obstructive airways disease ([place]) 08/08/2015  . 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[place]) 08/08/2015  . Hypercholesteremia 08/08/2015  . Psoriasis 08/08/2015  . Abnormal prostate specific antigen 06/01/2013  . CA of prostate ([place]) 06/01/2013  . Elevated prostate specific antigen (PSA) 06/01/2013  . Malignant neoplasm of prostate ([place]) 06/01/2013  . History of colon polyps 04/02/2010  . H/O malignant neoplasm of prostate 04/02/2010    Past Medical History  Diagnosis Date  . Prostate cancer ([place])   . Kidney stone   . COPD (chronic obstructive pulmonary disease) ([place])   . Hematuria, gross   . Arthritis   . Elevated PSA   . Shortness of breath dyspnea   . History of hiatal hernia   . HOH (hard of hearing)    Past Surgical History  Procedure Laterality Date  . Back surgery  1973, 1987  . Hernia repair      left and right inguinal hernia repair  . Radioactive seed implant N/A 06/24/2015    Procedure: RADIOACTIVE SEED IMPLANT/BRACHYTHERAPY IMPLANT;  Surgeon: Hollice Espy, MD;  Location: [facility];  Service: Urology;  Laterality: N/A;  . Cystoscopy  06/24/2015    Procedure: CYSTOSCOPY;  Surgeon: Hollice Espy, MD;  Location: [facility];  Service: Urology;;    Family History  Problem Relation Age of Onset  . Cancer Brother   . Heart disease Brother   . Diabetes Brother   . Emphysema Brother   . Diabetes Sister   . Emphysema Mother   . Healthy Sister   . Healthy Sister   . Healthy Sister   . Diabetes Brother   . Diabetes Brother   . Diabetes Brother   . Healthy Brother     Social History   Social History  . Marital Status: Married    Spouse Name: N/A  . Number of Children: N/A  . Years of Education: N/A   Occupational History  . Not on file.   Social History Main Topics  . Smoking status: Former Smoker -- 1.50 packs/day    Types: Cigarettes  . Smokeless tobacco: Current User    Types: Chew  . Alcohol Use: No  . Drug Use: No  . Sexual Activity: Not on file   Other Topics Concern  . Not on file   Social History Narrative    Outpatient Prescriptions Prior to Visit  Medication Sig Dispense Refill  . albuterol (PROAIR HFA) 108 (90 BASE) MCG/ACT inhaler Reported on 02/06/2016    . Cholecalciferol (VITAMIN D-3) 1000 UNITS CAPS Take 1 capsule by mouth daily.    . Coenzyme Q10 (COQ-10) 100 MG CAPS Take 100 mg by mouth. Reported on 02/06/2016    .  Magnesium 400 MG TABS Take 1 tablet by mouth daily.    . Red Yeast Rice 600 MG TABS Take 2 tablets by mouth at bedtime.     . tamsulosin (FLOMAX) 0.4 MG CAPS capsule Take 1 capsule (0.4 mg total) by mouth daily. (Patient not taking: Reported on 03/29/2016) 30 capsule 5  . HYDROcodone-homatropine (HYCODAN) 5-1.5 MG/5ML syrup Take 5 mLs by mouth every 8 (eight) hours as needed for cough. 120 mL 0  . oseltamivir (TAMIFLU) 75 MG capsule Take 1 capsule (75 mg total) by mouth 2 (two) times daily. 10 capsule 0   No facility-administered medications prior to visit.    Allergies  Allergen Reactions  . Mevacor [Lovastatin] Other (See Comments)    "feel washed out"  . Niacin And Related Other (See Comments)    "hot feeling"  . Statins Other (See Comments)    "feel washed out"  . Aleve  [Naproxen Sodium] Rash    Review of Systems  Constitutional: Positive for malaise/fatigue. Negative for fever and chills.  HENT: Positive for congestion, hearing loss (chronic) and sore throat. Negative for ear discharge, ear pain, nosebleeds and tinnitus.   Eyes: Positive for discharge and redness. Negative for blurred vision, double vision, photophobia and pain.  Respiratory: Positive for cough, sputum production and shortness of breath. Negative for wheezing.   Cardiovascular: Negative for chest pain, palpitations, orthopnea and leg swelling.  Neurological: Positive for weakness and headaches.   Objective:  BP 124/62 mmHg  Pulse 90  Temp(Src) 99.2 F (37.3 C) (Oral)  Resp 18  Wt 148 lb (67.132 kg)  SpO2 96% . Wt Readings from Last 3 Encounters:  03/29/16 148 lb (67.132 kg)  02/06/16 145 lb 12.8 oz (66.134 kg)  11/24/15 153 lb 7 oz (69.6 kg)     Physical Exam  Constitutional: He is oriented to person, place, and time. He appears distressed.  Slight dyspnea  HENT:  Head: Normocephalic.  Right Ear: External ear normal.  Left Ear: External ear normal.  Slightly reddened tonsillar pillars without exudates. Clear rhinorrhea.  Eyes: EOM are normal.  Neck: Normal range of motion.  Cardiovascular: Normal rate, regular rhythm and normal heart sounds.   Pulmonary/Chest: Breath sounds normal.  Slight dyspnea  Abdominal: Soft. Bowel sounds are normal.  Lymphadenopathy:    He has no cervical adenopathy.  Neurological: He is alert and oriented to person, place, and time.  Skin: No rash noted.  Psychiatric: Affect and judgment normal.    Assessment and Plan :  1. Dyspnea Onset over the past week without fever or purulent sputum. Some white to clear sputum from COPD. No rales or rhonchi noted on auscultation. Will get CXR (pulse oximetry 96% today). Increase fluid intake and use Albuterol MDI for wheezing/dyspnea. - DG Chest 2 View  2. Pharyngitis Onset with cough and watery  rhinorrhea. Strep test negative today. May gargle with warm saltwater and use antihistamine (OTC). - POCT rapid strep A  3. Malaise and fatigue Recurrence since diagnosed with Influenza-A on 02-06-16 and was treated with Tamiflu and Hycodan syrup. Has regained 3 lbs since that time. Eating 3 meals a day without GI up set. Will check labs for signs of bacterial or viral infection and rule out anemia. - CBC with Differential/Platelet - Comprehensive metabolic panel  4. H/O malignant neoplasm of prostate Diagnosed in 2009, originally treated with radiation and had radioactive seed implants on 06-24-15. Presently treated with "hormone shots" (Lupron). Denies dysuria. Notices urgency if "the water is turned  on at the faucet". Will get routine follow up labs.  5. Pulmonary emphysema, unspecified emphysema type ([place]) Still using some tobacco (chew). Has not used Albuterol recently. Will get CXR to rule out pneumonia versus acute exacerbation of emphysema. Encouraged to use the Albuterol inhaler 2 puffs QID prn.Edmonia Lynch and adjust medications pending x-ray of chest. - DG Chest [place], [name] [facility] 03/29/2016 10:18 AM

## 2016-03-30 LAB — CBC WITH DIFFERENTIAL/PLATELET
BASOS ABS: 0 10*3/uL (ref 0.0–0.2)
BASOS: 0 %
EOS (ABSOLUTE): 0.1 10*3/uL (ref 0.0–0.4)
Eos: 1 %
HEMATOCRIT: 45.3 % (ref 37.5–51.0)
HEMOGLOBIN: 15.4 g/dL (ref 12.6–17.7)
Immature Grans (Abs): 0 10*3/uL (ref 0.0–0.1)
Immature Granulocytes: 0 %
LYMPHS ABS: 1.9 10*3/uL (ref 0.7–3.1)
Lymphs: 15 %
MCH: 29 pg (ref 26.6–33.0)
MCHC: 34 g/dL (ref 31.5–35.7)
MCV: 85 fL (ref 79–97)
Monocytes Absolute: 1.4 10*3/uL — ABNORMAL HIGH (ref 0.1–0.9)
Monocytes: 11 %
NEUTROS ABS: 8.8 10*3/uL — AB (ref 1.4–7.0)
Neutrophils: 73 %
Platelets: 364 10*3/uL (ref 150–379)
RBC: 5.31 x10E6/uL (ref 4.14–5.80)
RDW: 13.1 % (ref 12.3–15.4)
WBC: 12.2 10*3/uL — ABNORMAL HIGH (ref 3.4–10.8)

## 2016-03-30 LAB — COMPREHENSIVE METABOLIC PANEL
ALBUMIN: 4.2 g/dL (ref 3.5–4.8)
ALK PHOS: 117 IU/L (ref 39–117)
ALT: 16 IU/L (ref 0–44)
AST: 24 IU/L (ref 0–40)
Albumin/Globulin Ratio: 1.4 (ref 1.2–2.2)
BILIRUBIN TOTAL: 0.4 mg/dL (ref 0.0–1.2)
BUN / CREAT RATIO: 22 (ref 10–24)
BUN: 21 mg/dL (ref 8–27)
CHLORIDE: 98 mmol/L (ref 96–106)
CO2: 28 mmol/L (ref 18–29)
Calcium: 9.9 mg/dL (ref 8.6–10.2)
Creatinine, Ser: 0.95 mg/dL (ref 0.76–1.27)
GFR calc non Af Amer: 77 mL/min/{1.73_m2} (ref >59)
GFR, EST AFRICAN AMERICAN: 89 mL/min/{1.73_m2} (ref >59)
GLOBULIN, TOTAL: 2.9 g/dL (ref 1.5–4.5)
Glucose: 116 mg/dL — ABNORMAL HIGH (ref 65–99)
Potassium: 6.1 mmol/L — ABNORMAL HIGH (ref 3.5–5.2)
Sodium: 143 mmol/L (ref 134–144)
TOTAL PROTEIN: 7.1 g/dL (ref 6.0–8.5)

## 2016-04-05 ENCOUNTER — Inpatient Hospital Stay: Payer: PPO | Attending: Radiation Oncology

## 2016-04-05 ENCOUNTER — Encounter: Payer: Self-pay | Admitting: Family Medicine

## 2016-04-05 ENCOUNTER — Ambulatory Visit (INDEPENDENT_AMBULATORY_CARE_PROVIDER_SITE_OTHER): Payer: PPO | Admitting: Family Medicine

## 2016-04-05 VITALS — BP 102/72 | HR 63 | Temp 97.9°F | Resp 14 | Wt 147.2 lb

## 2016-04-05 DIAGNOSIS — J439 Emphysema, unspecified: Secondary | ICD-10-CM

## 2016-04-05 DIAGNOSIS — J189 Pneumonia, unspecified organism: Secondary | ICD-10-CM

## 2016-04-05 DIAGNOSIS — C61 Malignant neoplasm of prostate: Secondary | ICD-10-CM

## 2016-04-05 DIAGNOSIS — Z79818 Long term (current) use of other agents affecting estrogen receptors and estrogen levels: Secondary | ICD-10-CM | POA: Insufficient documentation

## 2016-04-05 DIAGNOSIS — J181 Lobar pneumonia, unspecified organism: Principal | ICD-10-CM

## 2016-04-05 MED ORDER — LEUPROLIDE ACETATE (4 MONTH) 30 MG IM KIT
30.0000 mg | PACK | Freq: Once | INTRAMUSCULAR | Status: AC
  Administered 2016-04-05: 30 mg via INTRAMUSCULAR
  Filled 2016-04-05: qty 30

## 2016-04-05 NOTE — [place] Notes (Signed)
Patient ID: Sean Fitz., male   DOB: 04/08/1936, 80 y.o.   MRN: [id number]   Patient: Sean Wood. Male    DOB: 1936-10-11   81 y.o.   MRN: [id number] Visit Date: 04/05/2016  Today's Provider: [name], PA   Chief Complaint  Patient presents with  . Pneumonia  . Follow-up   Subjective:    Pneumonia He complains of cough. The current episode started 1 to 4 weeks ago. The problem has been rapidly improving. He reports significant improvement on treatment. His past medical history is significant for emphysema.  Finished antibiotics this morning.   Patient Active Problem List   Diagnosis Date Noted  . Acute exacerbation of chronic obstructive airways disease ([place]) 08/08/2015  . 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[place]) 08/08/2015  . Hypercholesteremia 08/08/2015  . Psoriasis 08/08/2015  . Abnormal prostate specific antigen 06/01/2013  . CA of prostate ([place]) 06/01/2013  . Elevated prostate specific antigen (PSA) 06/01/2013  . Malignant neoplasm of prostate ([place]) 06/01/2013  . History of colon polyps 04/02/2010  . H/O malignant neoplasm of prostate 04/02/2010   Past Surgical History  Procedure Laterality Date  . Back surgery  1973, 1987  . Hernia repair      left and right inguinal hernia repair  . Radioactive seed implant N/A 06/24/2015    Procedure: RADIOACTIVE SEED IMPLANT/BRACHYTHERAPY IMPLANT;  Surgeon: Hollice Espy, MD;  Location: [facility];  Service: Urology;  Laterality: N/A;  . Cystoscopy  06/24/2015    Procedure: CYSTOSCOPY;  Surgeon: Hollice Espy, MD;  Location: [facility];  Service: Urology;;   Family History  Problem Relation Age of Onset  . Cancer Brother   . Heart disease Brother   . Diabetes Brother   . Emphysema Brother   . Diabetes Sister   . Emphysema Mother   . Healthy Sister   . Healthy Sister   . Healthy Sister   . Diabetes Brother   . Diabetes Brother   . Diabetes Brother   . Healthy Brother    Previous Medications   ALBUTEROL (PROAIR HFA) 108 (90 BASE) MCG/ACT INHALER    Reported on 02/06/2016   CHOLECALCIFEROL (VITAMIN D-3) 1000 UNITS CAPS    Take 1 capsule by mouth daily.   COENZYME Q10 (COQ-10) 100 MG CAPS    Take 100 mg by mouth. Reported on 02/06/2016   MAGNESIUM 400 MG TABS    Take 1 tablet by mouth daily.   RED YEAST RICE 600 MG TABS    Take 2 tablets by mouth at bedtime.    TAMSULOSIN (FLOMAX) 0.4 MG CAPS CAPSULE    Take 1 capsule (0.4 mg total) by mouth daily.   Allergies  Allergen Reactions  . Mevacor [Lovastatin] Other (See Comments)    "feel washed out"  . Niacin And Related Other (See Comments)    "hot feeling"  . Statins Other (See Comments)    "feel washed out"  . Aleve [Naproxen Sodium] Rash    Review of Systems  Constitutional: Negative.   HENT: Negative.   Eyes: Negative.   Respiratory: Positive for cough.   Cardiovascular: Negative.   Gastrointestinal: Negative.   Endocrine: Negative.   Genitourinary: Negative.   Musculoskeletal: Negative.   Skin: Negative.   Allergic/Immunologic: Negative.   Neurological: Negative.   Hematological: Negative.   Psychiatric/Behavioral: Negative.     Social History  Substance Use Topics  . Smoking status: Former Smoker -- 1.50 packs/day    Types: Cigarettes  . Smokeless tobacco: Current  User    Types: Chew  . Alcohol Use: No   Objective:   BP 102/72 mmHg  Pulse 63  Temp(Src) 97.9 F (36.6 C) (Oral)  Resp 14  Wt 147 lb 3.2 oz (66.769 kg)  Physical Exam  Constitutional: He is oriented to person, place, and time. He appears well-developed and well-nourished.  HENT:  Head: Normocephalic.  Right Ear: External ear normal.  Left Ear: External ear normal.  Nose: Nose normal.  Mouth/Throat: Oropharynx is clear and moist.  Eyes: Conjunctivae and EOM are normal.  Neck: Normal range of motion. Neck supple.  Cardiovascular: Normal rate and regular rhythm.   Pulmonary/Chest: Effort normal and breath sounds normal. He has no rales.    Abdominal: Soft. Bowel sounds are normal.  Neurological: He is alert and oriented to person, place, and time.      Assessment & Plan:     1. Left lower lobe pneumonia Resolved. No rales or dyspnea today. Finished the Levaquin and no longer using cough medication or Albuterol. May slowly increase activity level. Encouraged to drink plenty of fluids daily. Recheck in 1 month for follow up CXR.  2. Pulmonary emphysema, unspecified emphysema type ([place]) Stable and no significant dyspnea. Rarely uses Albuterol for wheezing or dyspnea with cough. Recheck in 1 month.

## 2016-04-28 ENCOUNTER — Telehealth: Payer: Self-pay | Admitting: Family Medicine

## 2016-04-28 DIAGNOSIS — J181 Lobar pneumonia, unspecified organism: Principal | ICD-10-CM

## 2016-04-28 DIAGNOSIS — J189 Pneumonia, unspecified organism: Secondary | ICD-10-CM

## 2016-04-28 NOTE — Telephone Encounter (Signed)
Pt's wife stated that she thought Simona Huh wanted pt to have another chest X ray done before his appt on 05/03/16. I advised wife that Simona Huh is out of the office today. Please advise. Thanks TNP

## 2016-04-28 NOTE — Telephone Encounter (Signed)
Note just says follow up in 1 month for recheck on chest xray not clear on what that means, please review and let your nurse call them back please. Thank you-aa

## 2016-04-29 NOTE — Telephone Encounter (Signed)
May schedule follow up chest x-ray with history of recent pneumonia and long history of emphysema. Can review at appointment on Monday 05-03-16. Left future order in chart to release when he is ready to get x-ray done.

## 2016-04-29 NOTE — Telephone Encounter (Signed)
Patient's wife Patric Dykes advised that CXR has been released and patient can go to [facility] this afternoon or tomorrow to have CXR done for follow up OV on 05/03/2016.

## 2016-04-30 ENCOUNTER — Ambulatory Visit
Admission: RE | Admit: 2016-04-30 | Discharge: 2016-04-30 | Disposition: A | Discharge status: Home / Self Care | Payer: PPO | Source: Ambulatory Visit | Attending: Family Medicine | Admitting: Family Medicine

## 2016-04-30 DIAGNOSIS — J189 Pneumonia, unspecified organism: Secondary | ICD-10-CM | POA: Diagnosis not present

## 2016-04-30 DIAGNOSIS — J449 Chronic obstructive pulmonary disease, unspecified: Secondary | ICD-10-CM | POA: Insufficient documentation

## 2016-05-03 ENCOUNTER — Ambulatory Visit (INDEPENDENT_AMBULATORY_CARE_PROVIDER_SITE_OTHER): Payer: PPO | Admitting: Family Medicine

## 2016-05-03 ENCOUNTER — Encounter: Payer: Self-pay | Admitting: Family Medicine

## 2016-05-03 VITALS — BP 108/64 | HR 61 | Temp 98.0°F | Resp 16 | Wt 151.4 lb

## 2016-05-03 DIAGNOSIS — J189 Pneumonia, unspecified organism: Secondary | ICD-10-CM | POA: Diagnosis not present

## 2016-05-03 DIAGNOSIS — Z8546 Personal history of malignant neoplasm of prostate: Secondary | ICD-10-CM

## 2016-05-03 DIAGNOSIS — J181 Lobar pneumonia, unspecified organism: Principal | ICD-10-CM

## 2016-05-03 NOTE — [place] Notes (Signed)
Patient ID: [name], male   DOB: [birth year], 80 y.o.   MRN: [id number]   Patient: Sean Wood. Male    DOB: 1936-11-12   80 y.o.   MRN: [id number] Visit Date: 05/03/2016  Today's Provider: [name], PA   Chief Complaint  Patient presents with  . Pneumonia  . Follow-up   Subjective:    HPI Pneumonia Follow Up:    Patient is following up from 04/05/2016 OV. Patient finished Levaquin. Patient was advised to increase fluid intake, increase activity level slowly, and follow up today for a repeat CXR. Patient reports symptoms remaining include cough.   Past Medical History  Diagnosis Date  . Prostate cancer ([place])   . Kidney stone   . COPD (chronic obstructive pulmonary disease) ([place])   . Hematuria, gross   . Arthritis   . Elevated PSA   . Shortness of breath dyspnea   . History of hiatal hernia   . HOH (hard of hearing)    Past Surgical History  Procedure Laterality Date  . Back surgery  1973, 1987  . Hernia repair      left and right inguinal hernia repair  . Radioactive seed implant N/A 06/24/2015    Procedure: RADIOACTIVE SEED IMPLANT/BRACHYTHERAPY IMPLANT;  Surgeon: Hollice Espy, MD;  Location: [facility];  Service: Urology;  Laterality: N/A;  . Cystoscopy  06/24/2015    Procedure: CYSTOSCOPY;  Surgeon: Hollice Espy, MD;  Location: [facility];  Service: Urology;;      Family History  Problem Relation Age of Onset  . Cancer Brother   . Heart disease Brother   . Diabetes Brother   . Emphysema Brother   . Diabetes Sister   . Emphysema Mother   . Healthy Sister   . Healthy Sister   . Healthy Sister   . Diabetes Brother   . Diabetes Brother   . Diabetes Brother   . Healthy Brother     Previous Medications   ALBUTEROL (PROAIR HFA) 108 (90 BASE) MCG/ACT INHALER    Reported on 02/06/2016   CHOLECALCIFEROL (VITAMIN D-3) 1000 UNITS CAPS    Take 1 capsule by mouth daily.   COENZYME Q10 (COQ-10) 100 MG CAPS    Take 100 mg by mouth. Reported on  02/06/2016   MAGNESIUM 400 MG TABS    Take 1 tablet by mouth daily.   RED YEAST RICE 600 MG TABS    Take 2 tablets by mouth at bedtime.    TAMSULOSIN (FLOMAX) 0.4 MG CAPS CAPSULE    Take 1 capsule (0.4 mg total) by mouth daily.   Allergies  Allergen Reactions  . Mevacor [Lovastatin] Other (See Comments)    "feel washed out"  . Niacin And Related Other (See Comments)    "hot feeling"  . Statins Other (See Comments)    "feel washed out"  . Aleve [Naproxen Sodium] Rash    Review of Systems  Constitutional: Negative.   HENT: Negative.   Eyes: Negative.   Respiratory: Positive for cough.   Cardiovascular: Negative.   Gastrointestinal: Negative.   Endocrine: Negative.   Genitourinary: Negative.   Musculoskeletal: Negative.   Skin: Negative.   Allergic/Immunologic: Negative.   Neurological: Negative.   Hematological: Negative.   Psychiatric/Behavioral: Negative.    Social History  Substance Use Topics  . Smoking status: Former Smoker -- 1.50 packs/day    Types: Cigarettes  . Smokeless tobacco: Current User    Types: Chew  . Alcohol Use: No  Objective:   BP 108/64 mmHg  Pulse 61  Temp(Src) 98 F (36.7 C) (Oral)  Resp 16  Wt 151 lb 6.4 oz (68.675 kg)  SpO2 97% Wt Readings from Last 3 Encounters:  05/03/16 151 lb 6.4 oz (68.675 kg)  04/05/16 147 lb 3.2 oz (66.769 kg)  03/29/16 148 lb (67.132 kg)    Physical Exam  Constitutional: He is oriented to person, place, and time. He appears well-developed and well-nourished. No distress.  HENT:  Head: Normocephalic and atraumatic.  Right Ear: Hearing normal.  Left Ear: Hearing normal.  Nose: Nose normal.  Eyes: Conjunctivae and lids are normal. Right eye exhibits no discharge. Left eye exhibits no discharge. No scleral icterus.  Neck: Neck supple.  Cardiovascular: Normal rate and regular rhythm.   Pulmonary/Chest: Effort normal. No respiratory distress.  Clear to auscultation with distant breath sounds.  Musculoskeletal:  Normal range of motion.  Lymphadenopathy:    He has no cervical adenopathy.  Neurological: He is alert and oriented to person, place, and time.  Skin: Skin is intact. No lesion and no rash noted.  Psychiatric: He has a normal mood and affect. His speech is normal and behavior is normal. Thought content normal.      Assessment & Plan:     1. LLL pneumonia Repeat chest x-ray on 04-30-16 showed resolution of pneumonia with COPD signs still present. Feeling better and breathing easier. Has not had to use Albuterol recently. Regained lost weight. Recheck as needed. Consider pneumonia vaccination after next Lupron injection.  2. H/O malignant neoplasm of prostate Diagnosed and seed implants in 2016. Presently getting Lupron injections. Next shot 05-24-16 with Dr. Donella Stade.

## 2016-05-24 ENCOUNTER — Other Ambulatory Visit: Payer: Self-pay | Admitting: *Deleted

## 2016-05-24 ENCOUNTER — Encounter: Payer: Self-pay | Admitting: Radiation Oncology

## 2016-05-24 ENCOUNTER — Inpatient Hospital Stay: Payer: PPO | Attending: Radiation Oncology

## 2016-05-24 ENCOUNTER — Ambulatory Visit
Admission: RE | Admit: 2016-05-24 | Discharge: 2016-05-24 | Disposition: A | Discharge status: Home / Self Care | Payer: PPO | Source: Ambulatory Visit | Attending: Radiation Oncology | Admitting: Radiation Oncology

## 2016-05-24 VITALS — BP 109/65 | HR 64 | Temp 96.9°F | Wt 148.9 lb

## 2016-05-24 DIAGNOSIS — Z79818 Long term (current) use of other agents affecting estrogen receptors and estrogen levels: Secondary | ICD-10-CM | POA: Diagnosis not present

## 2016-05-24 DIAGNOSIS — C61 Malignant neoplasm of prostate: Secondary | ICD-10-CM | POA: Insufficient documentation

## 2016-05-24 DIAGNOSIS — Z923 Personal history of irradiation: Secondary | ICD-10-CM | POA: Insufficient documentation

## 2016-05-24 LAB — PSA: PSA: 0.13 ng/mL (ref 0.00–4.00)

## 2016-05-24 NOTE — Progress Notes (Signed)
.  Radiation Oncology Follow up Note  Name: Sean Wood.   Date:   05/24/2016 MRN:  [id number] DOB: 03-12-1936    This 80 y.o. male presents to the clinic today for follow-up for salvage I-125 interstitial implant now out 11 months.  REFERRING PROVIDER: Margo Common, PA  HPI: Patient is a 80 year old male originally treated in 2009 withIMRT radiation therapy for Gleason 7 (3+4) adenocarcinoma. He started having biochemical failure then underwent I-125 interstitial implant for salvage 11 months prior. He currently is on Lupron suppression. He specifically denies diarrhea dysuria or any other GI/GU complaints.. His last PSA was 5.7       6 months prior.  COMPLICATIONS OF TREATMENT: none  FOLLOW UP COMPLIANCE: keeps appointments   PHYSICAL EXAM:  BP 109/65 mmHg  Pulse 64  Temp(Src) 96.9 F (36.1 C)  Wt 148 lb 14.7 oz (67.55 kg) On rectal exam rectal sphincter tone is good. Prostate is smooth contracted without evidence of nodularity or mass. Sulcus is preserved bilaterally. No discrete nodularity is identified. No other rectal abnormalities are noted. Well-developed well-nourished patient in NAD. HEENT reveals PERLA, EOMI, discs not visualized.  Oral cavity is clear. No oral mucosal lesions are identified. Neck is clear without evidence of cervical or supraclavicular adenopathy. Lungs are clear to A&P. Cardiac examination is essentially unremarkable with regular rate and rhythm without murmur rub or thrill. Abdomen is benign with no organomegaly or masses noted. Motor sensory and DTR levels are equal and symmetric in the upper and lower extremities. Cranial nerves II through XII are grossly intact. Proprioception is intact. No peripheral adenopathy or edema is identified. No motor or sensory levels are noted. Crude visual fields are within normal range.  RADIOLOGY RESULTS: No current films for review  PLAN: Present time he is doing well I've run a PSA level on him today and will  report that separately. Probably like to keep him suppressed for 2 years. I have followed up with him in 4 months at which time he'll need another Lupron injection. We'll continue to monitor his PSA appropriately. Patient is to call with any concerns.  I would like to take this opportunity to thank you for allowing me to participate in the care of your patient.Armstead Peaks., MD

## 2016-07-08 DIAGNOSIS — L578 Other skin changes due to chronic exposure to nonionizing radiation: Secondary | ICD-10-CM | POA: Diagnosis not present

## 2016-07-08 DIAGNOSIS — L820 Inflamed seborrheic keratosis: Secondary | ICD-10-CM | POA: Diagnosis not present

## 2016-07-08 DIAGNOSIS — Z85828 Personal history of other malignant neoplasm of skin: Secondary | ICD-10-CM | POA: Diagnosis not present

## 2016-07-08 DIAGNOSIS — L821 Other seborrheic keratosis: Secondary | ICD-10-CM | POA: Diagnosis not present

## 2016-07-27 ENCOUNTER — Other Ambulatory Visit: Payer: Self-pay | Admitting: *Deleted

## 2016-07-27 DIAGNOSIS — C61 Malignant neoplasm of prostate: Secondary | ICD-10-CM

## 2016-08-05 ENCOUNTER — Inpatient Hospital Stay: Payer: PPO | Attending: Radiation Oncology

## 2016-08-05 DIAGNOSIS — C61 Malignant neoplasm of prostate: Secondary | ICD-10-CM | POA: Insufficient documentation

## 2016-08-05 DIAGNOSIS — Z79818 Long term (current) use of other agents affecting estrogen receptors and estrogen levels: Secondary | ICD-10-CM | POA: Insufficient documentation

## 2016-08-05 MED ORDER — LEUPROLIDE ACETATE (4 MONTH) 30 MG IM KIT
30.0000 mg | PACK | Freq: Once | INTRAMUSCULAR | Status: AC
Start: 2016-08-05 — End: 2016-08-05
  Administered 2016-08-05: 30 mg via INTRAMUSCULAR
  Filled 2016-08-05: qty 30

## 2016-09-09 DIAGNOSIS — M5030 Other cervical disc degeneration, unspecified cervical region: Secondary | ICD-10-CM | POA: Diagnosis not present

## 2016-09-09 DIAGNOSIS — Z79899 Other long term (current) drug therapy: Secondary | ICD-10-CM | POA: Diagnosis not present

## 2016-09-09 DIAGNOSIS — M0589 Other rheumatoid arthritis with rheumatoid factor of multiple sites: Secondary | ICD-10-CM | POA: Diagnosis not present

## 2016-09-09 DIAGNOSIS — M810 Age-related osteoporosis without current pathological fracture: Secondary | ICD-10-CM | POA: Diagnosis not present

## 2016-09-09 DIAGNOSIS — E784 Other hyperlipidemia: Secondary | ICD-10-CM | POA: Diagnosis not present

## 2016-09-09 DIAGNOSIS — M150 Primary generalized (osteo)arthritis: Secondary | ICD-10-CM | POA: Diagnosis not present

## 2016-09-09 DIAGNOSIS — R0600 Dyspnea, unspecified: Secondary | ICD-10-CM | POA: Diagnosis not present

## 2016-10-11 ENCOUNTER — Other Ambulatory Visit: Payer: Self-pay | Admitting: *Deleted

## 2016-10-11 ENCOUNTER — Encounter: Payer: Self-pay | Admitting: Radiation Oncology

## 2016-10-11 ENCOUNTER — Inpatient Hospital Stay: Payer: PPO | Attending: Radiation Oncology

## 2016-10-11 ENCOUNTER — Ambulatory Visit
Admission: RE | Admit: 2016-10-11 | Discharge: 2016-10-11 | Disposition: A | Discharge status: Home / Self Care | Payer: PPO | Source: Ambulatory Visit | Attending: Radiation Oncology | Admitting: Radiation Oncology

## 2016-10-11 VITALS — BP 120/67 | HR 69 | Temp 96.3°F | Resp 20 | Wt 152.9 lb

## 2016-10-11 DIAGNOSIS — Z79818 Long term (current) use of other agents affecting estrogen receptors and estrogen levels: Secondary | ICD-10-CM | POA: Insufficient documentation

## 2016-10-11 DIAGNOSIS — C61 Malignant neoplasm of prostate: Secondary | ICD-10-CM

## 2016-10-11 DIAGNOSIS — Z923 Personal history of irradiation: Secondary | ICD-10-CM | POA: Diagnosis not present

## 2016-10-11 LAB — PSA: PSA: 0.06 ng/mL (ref 0.00–4.00)

## 2016-10-11 NOTE — Progress Notes (Signed)
Radiation Oncology Follow up Note  Name: Sean Wood.   Date:   10/11/2016 MRN:  [id number] DOB: 22-Mar-1936    This 80 y.o. male presents to the clinic today for follow-up for prostate cancer status post salvage I-125 interstitial implant now out 15 months.  REFERRING PROVIDER: Margo Common, PA  HPI: Patient is a 80 year old male now out 15 months having completed salvage I-125 interstitial implant after being treated initially in 2009 with I MRT radiation therapy for Gleason 7 (3+4) adenocarcinoma. He can with biochemical failure then seed implantation. He is currently on Lupron suppression has 1 more for month injection in December. He specifically denies diarrhea dysuria or any other GI/GU complaints. His last PSA was 0.13 tacking June. We have repeated his PSA level today.  COMPLICATIONS OF TREATMENT: none  FOLLOW UP COMPLIANCE: keeps appointments   PHYSICAL EXAM:  BP 120/67   Pulse 69   Temp (!) 96.3 F (35.7 C)   Resp 20   Wt 152 lb 14.2 oz (69.3 kg)   BMI 22.58 kg/m  On rectal exam rectal sphincter tone is good. Prostate is smooth contracted without evidence of nodularity or mass. Sulcus is preserved bilaterally. No discrete nodularity is identified. No other rectal abnormalities are noted. Well-developed well-nourished patient in NAD. HEENT reveals PERLA, EOMI, discs not visualized.  Oral cavity is clear. No oral mucosal lesions are identified. Neck is clear without evidence of cervical or supraclavicular adenopathy. Lungs are clear to A&P. Cardiac examination is essentially unremarkable with regular rate and rhythm without murmur rub or thrill. Abdomen is benign with no organomegaly or masses noted. Motor sensory and DTR levels are equal and symmetric in the upper and lower extremities. Cranial nerves II through XII are grossly intact. Proprioception is intact. No peripheral adenopathy or edema is identified. No motor or sensory levels are noted. Crude visual fields  are within normal range.  RADIOLOGY RESULTS: No current films for review  PLAN: At the present time he is doing well. I've repeated his PSA level today and will report that separately. He'll complete his Lupron suppression with another four-month [place] in December. I have asked to see him back in 1 year for follow-up. Patient knows to call sooner with any concerns.  I would like to take this opportunity to thank you for allowing me to participate in the care of your patient.Sean Wood., MD

## 2016-10-14 ENCOUNTER — Other Ambulatory Visit: Payer: Self-pay | Admitting: *Deleted

## 2016-10-14 DIAGNOSIS — C61 Malignant neoplasm of prostate: Secondary | ICD-10-CM

## 2016-11-01 ENCOUNTER — Encounter: Payer: Self-pay | Admitting: Podiatry

## 2016-11-01 ENCOUNTER — Ambulatory Visit (INDEPENDENT_AMBULATORY_CARE_PROVIDER_SITE_OTHER): Payer: PPO | Admitting: Podiatry

## 2016-11-01 VITALS — BP 118/73 | HR 56 | Temp 97.6°F | Resp 16 | Ht 69.0 in | Wt 150.0 lb

## 2016-11-01 DIAGNOSIS — L600 Ingrowing nail: Secondary | ICD-10-CM | POA: Diagnosis not present

## 2016-11-01 MED ORDER — NEOMYCIN-POLYMYXIN-HC 3.5-10000-1 OT SOLN: OTIC | 0 refills | Status: DC

## 2016-11-01 NOTE — Progress Notes (Signed)
   Subjective:    Patient ID: Sean Wood., male    DOB: 1936/08/09, 80 y.o.   MRN: [id number]  HPI: He presents today requesting to have his great toenail on his right foot removed. He states the toenail is just sore after he has mashed it several times in the past and just seems to be getting thicker.    Review of Systems  HENT: Positive for hearing loss.   All other systems reviewed and are negative.      Objective:   Physical Exam: Vital signs are stable alert and oriented 3 pulses are palpable. Neurologic sensorium is intact degenerative flexor intact muscle strength normal. Orthopedic evaluation demonstrates almost cystlike or full range of motion of crepitation. Cutaneous evaluation demonstrates supple well-hydrated cutis with a thick dystrophic painful nail hallux right.     Assessment & Plan:  Assessment: Painful dystrophic nail/ingrown nail hallux right.  Plan: Total nail matrixectomy was performed today after local anesthesia was administered he tolerated this procedure well. Phenol was applied dressed with a dry sterile compressive dressing was provided with oral and written home going instructions for care and soaking of his toe as well as a prescription for Cortisporin Otic to be applied twice daily. I will follow-up with him in 1-2 weeks. He will notify as with any concerns of infection or pain.

## 2016-11-01 NOTE — Patient Instructions (Signed)

## 2016-11-15 ENCOUNTER — Ambulatory Visit (INDEPENDENT_AMBULATORY_CARE_PROVIDER_SITE_OTHER): Payer: PPO | Admitting: Podiatry

## 2016-11-15 DIAGNOSIS — L600 Ingrowing nail: Secondary | ICD-10-CM

## 2016-11-16 NOTE — Progress Notes (Signed)
He presents today for follow-up of his nail avulsion hallux right. He states that it feels so much better than it did with the nail on the toe and he states that his healing. He does request that we make it heal faster.  Objective: Vital signs are stable he is alert and oriented 3. Right foot demonstrates a nice hallux with no erythema no cellulitis drainage or odor with a nice granular bed prepped for epithelialization.  Assessment: Well-healing matrixectomy hallux right.  Plan: I encouraged him to continue to soak in Epsom salts and water at least daily and cover with a Band-Aid during the daytime and leave open at bedtime. He understands this is amenable to will follow up with me in 3-4 weeks if necessary. If he has questions or concerns he will notify us immediately.

## 2016-11-17 ENCOUNTER — Telehealth: Payer: Self-pay | Admitting: Family Medicine

## 2016-11-17 NOTE — Telephone Encounter (Signed)
Called Pt to schedule AWV with NHA for 12/15- knb

## 2016-11-18 NOTE — Telephone Encounter (Signed)
Pt's wife returned call and rescheduled AWV for 11/23/16 @ 845 am. Thanks TNP

## 2016-11-18 NOTE — Telephone Encounter (Signed)
Called Pt to re-schedule this appt on 12/20 Alyson Ingles will be out of the office that day.

## 2016-11-23 ENCOUNTER — Ambulatory Visit (INDEPENDENT_AMBULATORY_CARE_PROVIDER_SITE_OTHER): Payer: PPO

## 2016-11-23 VITALS — BP 112/60 | HR 68 | Temp 98.4°F | Ht 68.0 in | Wt 153.4 lb

## 2016-11-23 DIAGNOSIS — Z0000 Encounter for general adult medical examination without abnormal findings: Secondary | ICD-10-CM | POA: Diagnosis not present

## 2016-11-23 DIAGNOSIS — Z23 Encounter for immunization: Secondary | ICD-10-CM

## 2016-11-23 NOTE — Patient Instructions (Signed)
Sean Wood , Thank you for taking time to come for your Medicare Wellness Visit. I appreciate your ongoing commitment to your health goals. Please review the following plan we discussed and let me know if I can assist you in the future.   These are the goals we discussed: Goals    . Increase water intake          Starting 11/23/16, I will continue to drink 7-8 glasses of water a day.       This is a list of the screening recommended for you and due dates:  Health Maintenance  Topic Date Due  . Tetanus Vaccine  11/23/2017*  . Shingles Vaccine  11/23/2026*  . Pneumonia vaccines (2 of 2 - PPSV23) 11/23/2017  . Flu Shot  Completed  *Topic was postponed. The date shown is not the original due date.   Preventive Care for Adults  A healthy lifestyle and preventive care can promote health and wellness. Preventive health guidelines for adults include the following key practices.  . A routine yearly physical is a good way to check with your health care provider about your health and preventive screening. It is a chance to share any concerns and updates on your health and to receive a thorough exam.  . Visit your dentist for a routine exam and preventive care every 6 months. Brush your teeth twice a day and floss once a day. Good oral hygiene prevents tooth decay and gum disease.  . The frequency of eye exams is based on your age, health, family medical history, use  of contact lenses, and other factors. Follow your health care provider's ecommendations for frequency of eye exams.  . Eat a healthy diet. Foods like vegetables, fruits, whole grains, low-fat dairy products, and lean protein foods contain the nutrients you need without too many calories. Decrease your intake of foods high in solid fats, added sugars, and salt. Eat the right amount of calories for you. Get information about a proper diet from your health care provider, if necessary.  . Regular physical exercise is one of the most  important things you can do for your health. Most adults should get at least 150 minutes of moderate-intensity exercise (any activity that increases your heart rate and causes you to sweat) each week. In addition, most adults need muscle-strengthening exercises on 2 or more days a week.  Silver Sneakers may be a benefit available to you. To determine eligibility, you may visit the website: www.silversneakers.com or contact program at 678-576-1828 Mon-Fri between 8AM-8PM.   . Maintain a healthy weight. The body mass index (BMI) is a screening tool to identify possible weight problems. It provides an estimate of body fat based on height and weight. Your health care provider can find your BMI and can help you achieve or maintain a healthy weight.   For adults 20 years and older: ? A BMI below 18.5 is considered underweight. ? A BMI of 18.5 to 24.9 is normal. ? A BMI of 25 to 29.9 is considered overweight. ? A BMI of 30 and above is considered obese.   . Maintain normal blood lipids and cholesterol levels by exercising and minimizing your intake of saturated fat. Eat a balanced diet with plenty of fruit and vegetables. Blood tests for lipids and cholesterol should begin at age 97 and be repeated every 5 years. If your lipid or cholesterol levels are high, you are over 50, or you are at high risk for heart disease, you may  need your cholesterol levels checked more frequently. Ongoing high lipid and cholesterol levels should be treated with medicines if diet and exercise are not working.  . If you smoke, find out from your health care provider how to quit. If you do not use tobacco, please do not start.  . If you choose to drink alcohol, please do not consume more than 2 drinks per day. One drink is considered to be 12 ounces (355 mL) of beer, 5 ounces (148 mL) of wine, or 1.5 ounces (44 mL) of liquor.  . If you are 65-43 years old, ask your health care provider if you should take aspirin to prevent  strokes.  . Use sunscreen. Apply sunscreen liberally and repeatedly throughout the day. You should seek shade when your shadow is shorter than you. Protect yourself by wearing long sleeves, pants, a wide-brimmed hat, and sunglasses year round, whenever you are outdoors.  . Once a month, do a whole body skin exam, using a mirror to look at the skin on your back. Tell your health care provider of new moles, moles that have irregular borders, moles that are larger than a pencil eraser, or moles that have changed in shape or color.

## 2016-11-23 NOTE — Progress Notes (Signed)
Subjective:   Sean Wood. is a 80 y.o. male who presents for Medicare Annual/Subsequent preventive examination.  Review of Systems:  N/A  Cardiac Risk Factors include: advanced age (>58men, >61 women);dyslipidemia;male gender     Objective:    Vitals: BP 112/60 (BP Location: Right Arm)   Pulse 68   Temp 98.4 F (36.9 C) (Oral)   Ht 5\' 8"  (1.727 m)   Wt 153 lb 6 oz (69.6 kg)   BMI 23.32 kg/m   Body mass index is 23.32 kg/m.  Tobacco History  Smoking Status  . Former Smoker  . Packs/day: 1.50  . Types: Cigarettes  . Quit date: 05/24/1981  Smokeless Tobacco  . Current User  . Types: Chew     Ready to quit: Not Answered Counseling given: Not Answered   Past Medical History:  Diagnosis Date  . Arthritis   . COPD (chronic obstructive pulmonary disease) ([place])   . Elevated PSA   . Hematuria, gross   . History of hiatal hernia   . HOH (hard of hearing)   . Kidney stone   . Prostate cancer ([place])   . Shortness of breath dyspnea    Past Surgical History:  Procedure Laterality Date  . [place]  . CYSTOSCOPY  06/24/2015   Procedure: CYSTOSCOPY;  Surgeon: Hollice Espy, MD;  Location: [facility] ORS;  Service: Urology;;  . HERNIA REPAIR     left and right inguinal hernia repair  . RADIOACTIVE SEED IMPLANT N/A 06/24/2015   Procedure: RADIOACTIVE SEED IMPLANT/BRACHYTHERAPY IMPLANT;  Surgeon: Hollice Espy, MD;  Location: [facility] ORS;  Service: Urology;  Laterality: N/A;   Family History  Problem Relation Age of Onset  . Cancer Brother   . Heart disease Brother   . Diabetes Brother   . Emphysema Brother   . Diabetes Sister   . Emphysema Mother   . Healthy Sister   . Healthy Sister   . Healthy Sister   . Diabetes Brother   . Diabetes Brother   . Diabetes Brother   . Healthy Brother    History  Sexual Activity  . Sexual activity: Not on file    Outpatient Encounter Prescriptions as of 11/23/2016  Medication Sig  . Cholecalciferol (VITAMIN  D-3) 1000 UNITS CAPS Take 1 capsule by mouth daily.   . Coenzyme Q10 (COQ-10) 100 MG CAPS Take 100 mg by mouth. Reported on 02/06/2016  . Magnesium 400 MG TABS Take 1 tablet by mouth daily.  Marland Kitchen neomycin-polymyxin-hydrocortisone (CORTISPORIN) otic solution Apply one to two drops to toe after soaking twice daily.  . Red Yeast Rice 600 MG TABS Take 2 tablets by mouth at bedtime.   Marland Kitchen albuterol (PROAIR HFA) 108 (90 BASE) MCG/ACT inhaler Reported on 05/24/2016   No facility-administered encounter medications on file as of 11/23/2016.     Activities of Daily Living In your present state of health, do you have any difficulty performing the following activities: 11/23/2016  Hearing? Y  Vision? N  Difficulty concentrating or making decisions? N  Walking or climbing stairs? N  Dressing or bathing? N  Doing errands, shopping? N  Preparing Food and eating ? N  Using the Toilet? N  In the past six months, have you accidently leaked urine? N  Do you have problems with loss of bowel control? N  Managing your Medications? N  Managing your Finances? N  Housekeeping or managing your Housekeeping? N  Some recent data might be hidden  Patient Care Team: Margo Common, PA as PCP - General (Physician Assistant) Max Villa Herb, DPM as Consulting Physician (Podiatry) Noreene Filbert, MD as Referring Physician (Radiation Oncology)   Assessment:     Exercise Activities and Dietary recommendations Current Exercise Habits: The patient does not participate in regular exercise at present (yardwork), Exercise limited by: None identified  Goals    . Increase water intake          Starting 11/23/16, I will continue to drink 7-8 glasses of water a day.      Fall Risk Fall Risk  11/23/2016 10/11/2016 11/24/2015 07/22/2015 05/26/2015  Falls in the past year? Yes No No No No  Number falls in past yr: 1 - - - -  Injury with Fall? No - - - -   Depression Screen PHQ 2/9 Scores 11/23/2016 10/11/2016 11/24/2015 07/22/2015   PHQ - 2 Score 0 0 0 0    Cognitive Function     6CIT Screen 11/23/2016  What Year? 0 points  What month? 0 points  What time? 0 points  Count back from 20 2 points  Months in reverse 0 points  Repeat phrase 4 points  Total Score 6    Immunization History  Administered Date(s) Administered  . Influenza, High Dose Seasonal PF 11/23/2016  . Pneumococcal Conjugate-13 11/23/2016   Screening Tests Health Maintenance  Topic Date Due  . TETANUS/TDAP  11/23/2017 (Originally 11/20/1957)  . ZOSTAVAX  11/23/2026 (Originally 11/20/1998)  . PNA vac Low Risk Adult (2 of 2 - PPSV23) 11/23/2017  . INFLUENZA VACCINE  Completed      Plan:  I have personally reviewed and addressed the Medicare Annual Wellness questionnaire and have noted the following in the patient's chart:  A. Medical and social history B. Use of alcohol, tobacco or illicit drugs  C. Current medications and supplements D. Functional ability and status E.  Nutritional status F.  Physical activity G. Advance directives H. List of other physicians I.  Hospitalizations, surgeries, and ER visits in previous 12 months J.  [place] such as hearing and vision if needed, cognitive and depression L. Referrals and appointments - none  In addition, I have reviewed and discussed with patient certain preventive protocols, quality metrics, and best practice recommendations. A written personalized care plan for preventive services as well as general preventive health recommendations were provided to patient.  See attached scanned questionnaire for additional information.   Signed,  Fabio Neighbors, LPN Nurse Health Advisor   MD Recommendations/follow up: none  I have reviewed the health advisor's note and agree with documentation and plan.

## 2016-12-01 ENCOUNTER — Inpatient Hospital Stay: Payer: PPO | Attending: Radiation Oncology

## 2016-12-01 DIAGNOSIS — C61 Malignant neoplasm of prostate: Secondary | ICD-10-CM | POA: Diagnosis not present

## 2016-12-01 DIAGNOSIS — Z79818 Long term (current) use of other agents affecting estrogen receptors and estrogen levels: Secondary | ICD-10-CM | POA: Diagnosis not present

## 2016-12-01 MED ORDER — LEUPROLIDE ACETATE (4 MONTH) 30 MG IM KIT
30.0000 mg | PACK | Freq: Once | INTRAMUSCULAR | Status: AC
  Administered 2016-12-01: 30 mg via INTRAMUSCULAR
  Filled 2016-12-01: qty 30

## 2016-12-06 ENCOUNTER — Ambulatory Visit: Payer: PPO | Admitting: Podiatry

## 2016-12-08 ENCOUNTER — Ambulatory Visit: Payer: PPO

## 2016-12-24 ENCOUNTER — Ambulatory Visit (INDEPENDENT_AMBULATORY_CARE_PROVIDER_SITE_OTHER): Payer: PPO | Admitting: Family Medicine

## 2016-12-24 ENCOUNTER — Encounter: Payer: Self-pay | Admitting: Family Medicine

## 2016-12-24 VITALS — BP 114/68 | HR 63 | Temp 97.7°F | Resp 14 | Wt 156.8 lb

## 2016-12-24 DIAGNOSIS — J439 Emphysema, unspecified: Secondary | ICD-10-CM

## 2016-12-24 DIAGNOSIS — Z8546 Personal history of malignant neoplasm of prostate: Secondary | ICD-10-CM

## 2016-12-24 DIAGNOSIS — E7800 Pure hypercholesterolemia, unspecified: Secondary | ICD-10-CM | POA: Diagnosis not present

## 2016-12-24 NOTE — Progress Notes (Signed)
Patient: Sean Wood. Male    DOB: 12-16-1936   81 y.o.   MRN: [id number] Visit Date: 12/24/2016  Today's Provider: [name], PA   Chief Complaint  Patient presents with  . Follow-up   Subjective:    HPI Patient is here to follow up from [facility]WE that was done on 11/23/2016. Patient denies any concerns today. He is doing well on current medication regimen.     Lipid/Cholesterol, Follow-up:   Last seen for this 6 months ago.  Management since that visit includes continue OTC medications.  Last Lipid Panel: No results found for: CHOL, TRIG, HDL, CHOLHDL, VLDL, LDLCALC, LDLDIRECT  He reports good compliance with treatment. He is not having side effects.   Wt Readings from Last 3 Encounters:  12/24/16 156 lb 12.8 oz (71.1 kg)  11/23/16 153 lb 6 oz (69.6 kg)  11/01/16 150 lb (68 kg)    ------------------------------------------------------------------------ Past Medical History:  Diagnosis Date  . Arthritis   . COPD (chronic obstructive pulmonary disease) ([place])   . Elevated PSA   . Hematuria, gross   . History of hiatal hernia   . HOH (hard of hearing)   . Kidney stone   . Prostate cancer ([place])   . Shortness of breath dyspnea    Patient Active Problem List   Diagnosis Date Noted  . Acute exacerbation of chronic obstructive airways disease ([place]) 08/08/2015  . 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[place]) 08/08/2015  . Hypercholesteremia 08/08/2015  . Psoriasis 08/08/2015  . Abnormal prostate specific antigen 06/01/2013  . CA of prostate ([place]) 06/01/2013  . Elevated prostate specific antigen (PSA) 06/01/2013  . Malignant neoplasm of prostate ([place]) 06/01/2013  . History of colon polyps 04/02/2010  . H/O malignant neoplasm of prostate 04/02/2010   Past Surgical History:  Procedure Laterality Date  . [place]  . CYSTOSCOPY  06/24/2015   Procedure: CYSTOSCOPY;  Surgeon: Hollice Espy, MD;  Location: [facility] ORS;  Service: Urology;;  . HERNIA  REPAIR     left and right inguinal hernia repair  . RADIOACTIVE SEED IMPLANT N/A 06/24/2015   Procedure: RADIOACTIVE SEED IMPLANT/BRACHYTHERAPY IMPLANT;  Surgeon: Hollice Espy, MD;  Location: [facility] ORS;  Service: Urology;  Laterality: N/A;   Family History  Problem Relation Age of Onset  . Cancer Brother   . Heart disease Brother   . Diabetes Brother   . Emphysema Brother   . Diabetes Sister   . Emphysema Mother   . Healthy Sister   . Healthy Sister   . Healthy Sister   . Diabetes Brother   . Diabetes Brother   . Diabetes Brother   . Healthy Brother    Allergies  Allergen Reactions  . Mevacor [Lovastatin] Other (See Comments)    "feel washed out"  . Niacin And Related Other (See Comments)    "hot feeling"  . Statins Other (See Comments)    "feel washed out"  . Aleve [Naproxen Sodium] Rash     Previous Medications   ALBUTEROL (PROAIR HFA) 108 (90 BASE) MCG/ACT INHALER    Reported on 05/24/2016   CHOLECALCIFEROL (VITAMIN D-3) 1000 UNITS CAPS    Take 1 capsule by mouth daily.    COENZYME Q10 (COQ-10) 100 MG CAPS    Take 100 mg by mouth. Reported on 02/06/2016   MAGNESIUM 400 MG TABS    Take 1 tablet by mouth daily.   NEOMYCIN-POLYMYXIN-HYDROCORTISONE (CORTISPORIN) OTIC SOLUTION    Apply one to two drops to toe  after soaking twice daily.   RED YEAST RICE 600 MG TABS    Take 2 tablets by mouth at bedtime.     Review of Systems  Constitutional: Negative.   Respiratory: Negative.   Cardiovascular: Negative.   Musculoskeletal: Negative.     Social History  Substance Use Topics  . Smoking status: Former Smoker    Packs/day: 1.50    Types: Cigarettes    Quit date: 05/24/1981  . Smokeless tobacco: Current User    Types: Chew  . Alcohol use No   Objective:   BP 114/68 (BP Location: Right Arm, Patient Position: Sitting, Cuff Size: Normal)   Pulse 63   Temp 97.7 F (36.5 C) (Oral)   Resp 14   Wt 156 lb 12.8 oz (71.1 kg)   SpO2 96%   BMI 23.84 kg/m   Physical Exam    Constitutional: He is oriented to person, place, and time. He appears well-developed and well-nourished.  HENT:  Head: Normocephalic.  Right Ear: External ear normal.  Left Ear: External ear normal.  Mouth/Throat: Oropharynx is clear and moist.  Eyes: Conjunctivae and EOM are normal.  Neck: Neck supple.  Cardiovascular: Normal rate and regular rhythm.   Pulmonary/Chest: Effort normal and breath sounds normal.  Distant breath sounds.  Abdominal: Soft. Bowel sounds are normal.  Genitourinary:  Genitourinary Comments: Exam and PSA by oncologist in Dec. 2017.   Musculoskeletal: Normal range of motion.  Soreness in right shoulder to lift anything overhead.   Lymphadenopathy:    He has no cervical adenopathy.  Neurological: He is alert and oriented to person, place, and time.  Psychiatric: He has a normal mood and affect. His behavior is normal. Thought content normal.      Assessment & Plan:     1. Hypercholesteremia Trying to follow low fat diet and use Red Yeast Rice. Unable to take any statins in the past due to adverse reactions and Niacin causes intolerable hot flashes. Will check routine labs and follow up pending reports. - CBC with Differential/Platelet - Comprehensive metabolic panel - Lipid panel - TSH  2. H/O malignant neoplasm of prostate Denies urinary dysfunction. Last Lupron injection by Dr. Donella Stade (oncologist) was given Dec. 2017. PSA at that time was 0.06 per patient. Will recheck routine labs and advised to keep appointment with oncologist Nov. 2018. - CBC with Differential/Platelet - Comprehensive metabolic panel  3. Pulmonary emphysema, unspecified emphysema type ([place]) Denies dyspnea, cough, wheeze, shortness of breath or hemoptysis. Has not had to use any of his Albuterol. Will check routine labs and follow up prn. - CBC with Differential/Platelet

## 2016-12-27 DIAGNOSIS — E7800 Pure hypercholesterolemia, unspecified: Secondary | ICD-10-CM | POA: Diagnosis not present

## 2016-12-27 DIAGNOSIS — J439 Emphysema, unspecified: Secondary | ICD-10-CM | POA: Diagnosis not present

## 2016-12-27 DIAGNOSIS — Z8546 Personal history of malignant neoplasm of prostate: Secondary | ICD-10-CM | POA: Diagnosis not present

## 2016-12-28 ENCOUNTER — Telehealth: Payer: Self-pay

## 2016-12-28 LAB — LIPID PANEL
CHOLESTEROL TOTAL: 183 mg/dL (ref 100–199)
Chol/HDL Ratio: 2.8 ratio units (ref 0.0–5.0)
HDL: 66 mg/dL (ref >39)
LDL Calculated: 102 mg/dL — ABNORMAL HIGH (ref 0–99)
TRIGLYCERIDES: 73 mg/dL (ref 0–149)
VLDL CHOLESTEROL CAL: 15 mg/dL (ref 5–40)

## 2016-12-28 LAB — COMPREHENSIVE METABOLIC PANEL
ALBUMIN: 4.1 g/dL (ref 3.5–4.8)
ALK PHOS: 84 IU/L (ref 39–117)
ALT: 13 IU/L (ref 0–44)
AST: 20 IU/L (ref 0–40)
Albumin/Globulin Ratio: 2 (ref 1.2–2.2)
BILIRUBIN TOTAL: 0.3 mg/dL (ref 0.0–1.2)
BUN / CREAT RATIO: 18 (ref 10–24)
BUN: 16 mg/dL (ref 8–27)
CHLORIDE: 101 mmol/L (ref 96–106)
CO2: 29 mmol/L (ref 18–29)
CREATININE: 0.87 mg/dL (ref 0.76–1.27)
Calcium: 9.5 mg/dL (ref 8.6–10.2)
GFR calc Af Amer: 96 mL/min/{1.73_m2} (ref >59)
GFR calc non Af Amer: 83 mL/min/{1.73_m2} (ref >59)
GLUCOSE: 86 mg/dL (ref 65–99)
Globulin, Total: 2.1 g/dL (ref 1.5–4.5)
Potassium: 4.4 mmol/L (ref 3.5–5.2)
Sodium: 145 mmol/L — ABNORMAL HIGH (ref 134–144)
Total Protein: 6.2 g/dL (ref 6.0–8.5)

## 2016-12-28 LAB — CBC WITH DIFFERENTIAL/PLATELET
BASOS ABS: 0 10*3/uL (ref 0.0–0.2)
Basos: 0 %
EOS (ABSOLUTE): 0.2 10*3/uL (ref 0.0–0.4)
Eos: 4 %
HEMOGLOBIN: 14.4 g/dL (ref 13.0–17.7)
Hematocrit: 44.4 % (ref 37.5–51.0)
Immature Grans (Abs): 0 10*3/uL (ref 0.0–0.1)
Immature Granulocytes: 0 %
LYMPHS ABS: 1.7 10*3/uL (ref 0.7–3.1)
Lymphs: 35 %
MCH: 28.2 pg (ref 26.6–33.0)
MCHC: 32.4 g/dL (ref 31.5–35.7)
MCV: 87 fL (ref 79–97)
MONOCYTES: 7 %
MONOS ABS: 0.3 10*3/uL (ref 0.1–0.9)
NEUTROS ABS: 2.6 10*3/uL (ref 1.4–7.0)
Neutrophils: 54 %
PLATELETS: 288 10*3/uL (ref 150–379)
RBC: 5.1 x10E6/uL (ref 4.14–5.80)
RDW: 13.5 % (ref 12.3–15.4)
WBC: 4.8 10*3/uL (ref 3.4–10.8)

## 2016-12-28 LAB — TSH: TSH: 1.07 u[IU]/mL (ref 0.450–4.500)

## 2016-12-28 NOTE — Telephone Encounter (Signed)
-----   Message from Margo Common, [place] sent at 12/27/2016  5:29 PM EST ----- Normal blood cell counts. Awaiting final CMP results.

## 2016-12-28 NOTE — Telephone Encounter (Signed)
Advised pt of lab results. Pt verbally acknowledges understanding. Emily Drozdowski, CMA   

## 2016-12-28 NOTE — Telephone Encounter (Signed)
Advised pt of lab results. Pt verbally acknowledges understanding. Waneda Klammer Drozdowski, CMA   

## 2016-12-28 NOTE — Telephone Encounter (Signed)
-----   Message from Margo Common, [place] sent at 12/28/2016  8:41 AM EST ----- All blood tests back to normal. Proceed with oncology (Dr. Baruch Gouty) follow up as planned. Recheck lungs in 6 months or sooner if needed.

## 2017-01-24 ENCOUNTER — Ambulatory Visit (INDEPENDENT_AMBULATORY_CARE_PROVIDER_SITE_OTHER): Payer: PPO | Admitting: Family Medicine

## 2017-01-24 ENCOUNTER — Encounter: Payer: Self-pay | Admitting: Family Medicine

## 2017-01-24 VITALS — BP 124/78 | HR 63 | Temp 98.1°F | Resp 16 | Wt 155.2 lb

## 2017-01-24 DIAGNOSIS — S61412A Laceration without foreign body of left hand, initial encounter: Secondary | ICD-10-CM

## 2017-01-24 MED ORDER — CEPHALEXIN 500 MG PO CAPS: 500.0000 mg | ORAL_CAPSULE | Freq: Three times a day (TID) | ORAL | 0 refills | Status: DC

## 2017-01-24 NOTE — Patient Instructions (Signed)
Laceration Care, Adult Introduction A laceration is a cut that goes through all layers of the skin. The cut also goes into the tissue that is right under the skin. Some cuts heal on their own. Others need to be closed with stitches (sutures), staples, skin adhesive strips, or wound glue. Taking care of your cut lowers your risk of infection and helps your cut to heal better. How to take care of your cut For stitches or staples:  Keep the wound clean and dry.  If you were given a bandage (dressing), you should change it at least one time per day or as told by your doctor. You should also change it if it gets wet or dirty.  Keep the wound completely dry for the first 24 hours or as told by your doctor. After that time, you may take a shower or a bath. However, make sure that the wound is not soaked in water until after the stitches or staples have been removed.  Clean the wound one time each day or as told by your doctor:  Wash the wound with soap and water.  Rinse the wound with water until all of the soap comes off.  Pat the wound dry with a clean towel. Do not rub the wound.  After you clean the wound, put a thin layer of antibiotic ointment on it as told by your doctor. This ointment:  Helps to prevent infection.  Keeps the bandage from sticking to the wound.  Have your stitches or staples removed as told by your doctor. If your doctor used skin adhesive strips:  Keep the wound clean and dry.  If you were given a bandage, you should change it at least one time per day or as told by your doctor. You should also change it if it gets dirty or wet.  Do not get the skin adhesive strips wet. You can take a shower or a bath, but be careful to keep the wound dry.  If the wound gets wet, pat it dry with a clean towel. Do not rub the wound.  Skin adhesive strips fall off on their own. You can trim the strips as the wound heals. Do not remove any strips that are still stuck to the wound.  They will fall off after a while. If your doctor used wound glue:  Try to keep your wound dry, but you may briefly wet it in the shower or bath. Do not soak the wound in water, such as by swimming.  After you take a shower or a bath, gently pat the wound dry with a clean towel. Do not rub the wound.  Do not do any activities that will make you really sweaty until the skin glue has fallen off on its own.  Do not apply liquid, cream, or ointment medicine to your wound while the skin glue is still on.  If you were given a bandage, you should change it at least one time per day or as told by your doctor. You should also change it if it gets dirty or wet.  If a bandage is placed over the wound, do not let the tape for the bandage touch the skin glue.  Do not pick at the glue. The skin glue usually stays on for 5-10 days. Then, it falls off of the skin. General Instructions  To help prevent scarring, make sure to cover your wound with sunscreen whenever you are outside after stitches are removed, after adhesive strips are removed,   or when wound glue stays in place and the wound is healed. Make sure to wear a sunscreen of at least 30 SPF.  Take over-the-counter and prescription medicines only as told by your doctor.  If you were given antibiotic medicine or ointment, take or apply it as told by your doctor. Do not stop using the antibiotic even if your wound is getting better.  Do not scratch or pick at the wound.  Keep all follow-up visits as told by your doctor. This is important.  Check your wound every day for signs of infection. Watch for:  Redness, swelling, or pain.  Fluid, blood, or pus.  Raise (elevate) the injured area above the level of your heart while you are sitting or lying down, if possible. Get help if:  You got a tetanus shot and you have any of these problems at the injection site:  Swelling.  Very bad pain.  Redness.  Bleeding.  You have a fever.  A wound  that was closed breaks open.  You notice a bad smell coming from your wound or your bandage.  You notice something coming out of the wound, such as wood or glass.  Medicine does not help your pain.  You have more redness, swelling, or pain at the site of your wound.  You have fluid, blood, or pus coming from your wound.  You notice a change in the color of your skin near your wound.  You need to change the bandage often because fluid, blood, or pus is coming from the wound.  You start to have a new rash.  You start to have numbness around the wound. Get help right away if:  You have very bad swelling around the wound.  Your pain suddenly gets worse and is very bad.  You notice painful lumps near the wound or on skin that is anywhere on your body.  You have a red streak going away from your wound.  The wound is on your hand or foot and you cannot move a finger or toe like you usually can.  The wound is on your hand or foot and you notice that your fingers or toes look pale or bluish. This information is not intended to replace advice given to you by your health care provider. Make sure you discuss any questions you have with your health care provider. Document Released: 05/24/2008 Document Revised: 05/13/2016 Document Reviewed: 12/02/2014  2017 Elsevier  

## 2017-01-24 NOTE — Progress Notes (Signed)
Patient: Sean Wood. Male    DOB: [birth year]   81 y.o.   MRN: [id number] Visit Date: 01/24/2017  Today's Provider: [name], PA   Chief Complaint  Patient presents with  . Laceration   Subjective:    Laceration   Incident onset: Friday. The laceration is located on the left hand. Injury mechanism: chainsaw. The pain is mild. He reports no foreign bodies present. His tetanus status is UTD.   Past Medical History:  Diagnosis Date  . Arthritis   . COPD (chronic obstructive pulmonary disease) ([place])   . Elevated PSA   . Hematuria, gross   . History of hiatal hernia   . HOH (hard of hearing)   . Kidney stone   . Prostate cancer ([place])   . Shortness of breath dyspnea    Past Surgical History:  Procedure Laterality Date  . [place]  . CYSTOSCOPY  06/24/2015   Procedure: CYSTOSCOPY;  Surgeon: Hollice Espy, MD;  Location: [facility];  Service: Urology;;  . HERNIA REPAIR     left and right inguinal hernia repair  . RADIOACTIVE SEED IMPLANT N/A 06/24/2015   Procedure: RADIOACTIVE SEED IMPLANT/BRACHYTHERAPY IMPLANT;  Surgeon: Hollice Espy, MD;  Location: [facility];  Service: Urology;  Laterality: N/A;   Family History  Problem Relation Age of Onset  . Cancer Brother   . Heart disease Brother   . Diabetes Brother   . Emphysema Brother   . Diabetes Sister   . Emphysema Mother   . Healthy Sister   . Healthy Sister   . Healthy Sister   . Diabetes Brother   . Diabetes Brother   . Diabetes Brother   . Healthy Brother    Allergies  Allergen Reactions  . Mevacor [Lovastatin] Other (See Comments)    "feel washed out"  . Niacin And Related Other (See Comments)    "hot feeling"  . Statins Other (See Comments)    "feel washed out"  . Aleve [Naproxen Sodium] Rash     Previous Medications   ALBUTEROL (PROAIR HFA) 108 (90 BASE) MCG/ACT INHALER    Reported on 05/24/2016   CHOLECALCIFEROL (VITAMIN D-3) 1000 UNITS CAPS    Take 1 capsule by mouth daily.    COENZYME Q10 (COQ-10) 100 MG CAPS    Take 100 mg by mouth. Reported on 02/06/2016   MAGNESIUM 400 MG TABS    Take 1 tablet by mouth daily.   NEOMYCIN-POLYMYXIN-HYDROCORTISONE (CORTISPORIN) OTIC SOLUTION    Apply one to two drops to toe after soaking twice daily.   RED YEAST RICE 600 MG TABS    Take 2 tablets by mouth at bedtime.     Review of Systems  Constitutional: Negative.   Respiratory: Negative.   Cardiovascular: Negative.     Social History  Substance Use Topics  . Smoking status: Former Smoker    Packs/day: 1.50    Types: Cigarettes    Quit date: 05/24/1981  . Smokeless tobacco: Current User    Types: Chew  . Alcohol use No   Objective:   BP 124/78 (BP Location: Right Arm, Patient Position: Sitting, Cuff Size: Normal)   Pulse 63   Temp 98.1 F (36.7 C) (Oral)   Resp 16   Wt 155 lb 3.2 oz (70.4 kg)   SpO2 98%   BMI 23.60 kg/m   Physical Exam  Constitutional: He is oriented to person, place, and time. He appears well-developed and well-nourished. No distress.  HENT:  Head: Normocephalic and atraumatic.  Right Ear: Hearing normal.  Left Ear: Hearing normal.  Nose: Nose normal.  Eyes: Conjunctivae and lids are normal. Right eye exhibits no discharge. Left eye exhibits no discharge. No scleral icterus.  Cardiovascular: Normal rate and regular rhythm.   Pulmonary/Chest: Effort normal and breath sounds normal. No respiratory distress.  Abdominal: Soft.  Musculoskeletal: Normal range of motion.  Neurological: He is alert and oriented to person, place, and time.  Skin: Skin is intact. No lesion and no rash noted.  4 cm x 2 mm laceration/abrasion on the dorsum of the web between the left index finger and thumb. Good grip strength. Large area of erythema without drainage. No local lymphadenopathy.  Psychiatric: He has a normal mood and affect. His speech is normal and behavior is normal. Thought content normal.      Assessment & Plan:     1. 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 As a friend was cutting the hog, the patient was holding the hog's head. When the blade cut through, it hit his left hand. Did not seek medical attention until now. Has cleaned the wound with soap and water then bandaged it. Finished the meat cutting job with a rubber glove on it. Last tetanus booster was on 10-14-11. Will give antibiotic for infection and recheck in 8 days. Keep the laceration clean and dry.  - cephALEXin (KEFLEX) 500 MG capsule; Take 1 capsule (500 mg total) by mouth 3 (three) times daily.  Dispense: 21 capsule; Refill: 0

## 2017-02-01 ENCOUNTER — Encounter: Payer: Self-pay | Admitting: Family Medicine

## 2017-02-01 ENCOUNTER — Ambulatory Visit (INDEPENDENT_AMBULATORY_CARE_PROVIDER_SITE_OTHER): Payer: PPO | Admitting: Family Medicine

## 2017-02-01 VITALS — BP 116/66 | HR 69 | Temp 98.3°F | Resp 14 | Wt 156.6 lb

## 2017-02-01 DIAGNOSIS — S61412D Laceration without foreign body of left hand, subsequent encounter: Secondary | ICD-10-CM | POA: Diagnosis not present

## 2017-02-01 NOTE — Progress Notes (Signed)
Patient: Sean Wood. Male    DOB: June 29, 1936   81 y.o.   MRN: [id number] Visit Date: 02/01/2017  Today's Provider: [name], PA   Chief Complaint  Patient presents with  . Follow-up   Subjective:    HPI Patient is here for a 8 day follow up on left hand laceration. Patient was prescribed Cephalexin 500 mg on 01/24/2017 for infection. Patient reports good compliance with treatment plan. He has completed antibiotics.   Past Medical History:  Diagnosis Date  . Arthritis   . COPD (chronic obstructive pulmonary disease) ([place])   . Elevated PSA   . Hematuria, gross   . History of hiatal hernia   . HOH (hard of hearing)   . Kidney stone   . Prostate cancer ([place])   . Shortness of breath dyspnea    Past Surgical History:  Procedure Laterality Date  . [place]  . CYSTOSCOPY  06/24/2015   Procedure: CYSTOSCOPY;  Surgeon: Hollice Espy, MD;  Location: [facility];  Service: Urology;;  . HERNIA REPAIR     left and right inguinal hernia repair  . RADIOACTIVE SEED IMPLANT N/A 06/24/2015   Procedure: RADIOACTIVE SEED IMPLANT/BRACHYTHERAPY IMPLANT;  Surgeon: Hollice Espy, MD;  Location: [facility];  Service: Urology;  Laterality: N/A;   Family History  Problem Relation Age of Onset  . Cancer Brother   . Heart disease Brother   . Diabetes Brother   . Emphysema Brother   . Diabetes Sister   . Emphysema Mother   . Healthy Sister   . Healthy Sister   . Healthy Sister   . Diabetes Brother   . Diabetes Brother   . Diabetes Brother   . Healthy Brother    Allergies  Allergen Reactions  . Mevacor [Lovastatin] Other (See Comments)    "feel washed out"  . Niacin And Related Other (See Comments)    "hot feeling"  . Statins Other (See Comments)    "feel washed out"  . Aleve [Naproxen Sodium] Rash     Previous Medications   ALBUTEROL (PROAIR HFA) 108 (90 BASE) MCG/ACT INHALER    Reported on 05/24/2016   CHOLECALCIFEROL (VITAMIN D-3) 1000 UNITS CAPS    Take 1  capsule by mouth daily.    COENZYME Q10 (COQ-10) 100 MG CAPS    Take 100 mg by mouth. Reported on 02/06/2016   MAGNESIUM 400 MG TABS    Take 1 tablet by mouth daily.   NEOMYCIN-POLYMYXIN-HYDROCORTISONE (CORTISPORIN) OTIC SOLUTION    Apply one to two drops to toe after soaking twice daily.   RED YEAST RICE 600 MG TABS    Take 2 tablets by mouth at bedtime.     Review of Systems  Constitutional: Negative.   Respiratory: Negative.   Cardiovascular: Negative.   Musculoskeletal:       Left hand laceration     Social History  Substance Use Topics  . Smoking status: Former Smoker    Packs/day: 1.50    Types: Cigarettes    Quit date: 05/24/1981  . Smokeless tobacco: Current User    Types: Chew  . Alcohol use No   Objective:   BP 116/66 (BP Location: Right Arm, Patient Position: Sitting, Cuff Size: Normal)   Pulse 69   Temp 98.3 F (36.8 C) (Oral)   Resp 14   Wt 156 lb 9.6 oz (71 kg)   SpO2 97%   BMI 23.81 kg/m   Physical Exam  Constitutional: He is  oriented to person, place, and time. He appears well-developed and well-nourished. No distress.  HENT:  Head: Normocephalic and atraumatic.  Right Ear: Hearing normal.  Left Ear: Hearing normal.  Nose: Nose normal.  Eyes: Conjunctivae and lids are normal. Right eye exhibits no discharge. Left eye exhibits no discharge. No scleral icterus.  Pulmonary/Chest: Effort normal. No respiratory distress.  Musculoskeletal: Normal range of motion.  Neurological: He is alert and oriented to person, place, and time.  Skin: Skin is intact. No lesion and no rash noted.  Laceration of left hand healing well without purulent drainage from scab. Only slight pinkness remains around scab.  Psychiatric: He has a normal mood and affect. His speech is normal and behavior is normal. Thought content normal.      Assessment & Plan:     1. Laceration of left hand without foreign body, subsequent encounter Healing well without signs of infection. Finished  all the antibiotic. Still using Neosporin ointment with bandage to protect it. Recheck prn.

## 2017-05-23 ENCOUNTER — Encounter: Payer: Self-pay | Admitting: Family Medicine

## 2017-05-23 ENCOUNTER — Ambulatory Visit (INDEPENDENT_AMBULATORY_CARE_PROVIDER_SITE_OTHER): Payer: PPO | Admitting: Family Medicine

## 2017-05-23 VITALS — BP 102/66 | HR 68 | Temp 98.0°F | Resp 20 | Wt 154.0 lb

## 2017-05-23 DIAGNOSIS — J439 Emphysema, unspecified: Secondary | ICD-10-CM

## 2017-05-23 DIAGNOSIS — E7800 Pure hypercholesterolemia, unspecified: Secondary | ICD-10-CM | POA: Diagnosis not present

## 2017-05-23 MED ORDER — FLUTICASONE FUROATE-VILANTEROL 100-25 MCG/INH IN AEPB: 1.0000 | INHALATION_SPRAY | Freq: Every day | RESPIRATORY_TRACT | 0 refills | Status: DC

## 2017-05-23 NOTE — Patient Instructions (Signed)
Chronic Obstructive Pulmonary Disease Chronic obstructive pulmonary disease (COPD) is a common lung condition in which airflow from the lungs is limited. COPD is a general term that can be used to describe many different lung problems that limit airflow, including both chronic bronchitis and emphysema. If you have COPD, your lung function will probably never return to normal, but there are measures you can take to improve lung function and make yourself feel better. What are the causes?  Smoking (common).  Exposure to secondhand smoke.  Genetic problems.  Chronic inflammatory lung diseases or recurrent infections. What are the signs or symptoms?  Shortness of breath, especially with physical activity.  Deep, persistent (chronic) cough with a large amount of thick mucus.  Wheezing.  Rapid breaths (tachypnea).  Gray or bluish discoloration (cyanosis) of the skin, especially in your fingers, toes, or lips.  Fatigue.  Weight loss.  Frequent infections or episodes when breathing symptoms become much worse (exacerbations).  Chest tightness. How is this diagnosed? Your health care provider will take a medical history and perform a physical examination to diagnose COPD. Additional tests for COPD may include:  Lung (pulmonary) function tests.  Chest X-ray.  CT scan.  Blood tests. How is this treated? Treatment for COPD may include:  Inhaler and nebulizer medicines. These help manage the symptoms of COPD and make your breathing more comfortable.  Supplemental oxygen. Supplemental oxygen is only helpful if you have a low oxygen level in your blood.  Exercise and physical activity. These are beneficial for nearly all people with COPD.  Lung surgery or transplant.  Nutrition therapy to gain weight, if you are underweight.  Pulmonary rehabilitation. This may involve working with a team of health care providers and specialists, such as respiratory, occupational, and physical  therapists. Follow these instructions at home:  Take all medicines (inhaled or pills) as directed by your health care provider.  Avoid over-the-counter medicines or cough syrups that dry up your airway (such as antihistamines) and slow down the elimination of secretions unless instructed otherwise by your health care provider.  If you are a smoker, the most important thing that you can do is stop smoking. Continuing to smoke will cause further lung damage and breathing trouble. Ask your health care provider for help with quitting smoking. He or she can direct you to community resources or hospitals that provide support.  Avoid exposure to irritants such as smoke, chemicals, and fumes that aggravate your breathing.  Use oxygen therapy and pulmonary rehabilitation if directed by your health care provider. If you require home oxygen therapy, ask your health care provider whether you should purchase a pulse oximeter to measure your oxygen level at home.  Avoid contact with individuals who have a contagious illness.  Avoid extreme temperature and humidity changes.  Eat healthy foods. Eating smaller, more frequent meals and resting before meals may help you maintain your strength.  Stay active, but balance activity with periods of rest. Exercise and physical activity will help you maintain your ability to do things you want to do.  Preventing infection and hospitalization is very important when you have COPD. Make sure to receive all the vaccines your health care provider recommends, especially the pneumococcal and influenza vaccines. Ask your health care provider whether you need a pneumonia vaccine.  Learn and use relaxation techniques to manage stress.  Learn and use controlled breathing techniques as directed by your health care provider. Controlled breathing techniques include: 1. Pursed lip breathing. Start by breathing in (inhaling)   through your nose for 1 second. Then, purse your lips as  if you were going to whistle and breathe out (exhale) through the pursed lips for 2 seconds. 2. Diaphragmatic breathing. Start by putting one hand on your abdomen just above your waist. Inhale slowly through your nose. The hand on your abdomen should move out. Then purse your lips and exhale slowly. You should be able to feel the hand on your abdomen moving in as you exhale.  Learn and use controlled coughing to clear mucus from your lungs. Controlled coughing is a series of short, progressive coughs. The steps of controlled coughing are: 1. Lean your head slightly forward. 2. Breathe in deeply using diaphragmatic breathing. 3. Try to hold your breath for 3 seconds. 4. Keep your mouth slightly open while coughing twice. 5. Spit any mucus out into a tissue. 6. Rest and repeat the steps once or twice as needed. Contact a health care provider if:  You are coughing up more mucus than usual.  There is a change in the color or thickness of your mucus.  Your breathing is more labored than usual.  Your breathing is faster than usual. Get help right away if:  You have shortness of breath while you are resting.  You have shortness of breath that prevents you from:  Being able to talk.  Performing your usual physical activities.  You have chest pain lasting longer than 5 minutes.  Your skin color is more cyanotic than usual.  You measure low oxygen saturations for longer than 5 minutes with a pulse oximeter. This information is not intended to replace advice given to you by your health care provider. Make sure you discuss any questions you have with your health care provider. Document Released: 09/15/2005 Document Revised: 05/13/2016 Document Reviewed: 08/02/2013 [employer] Interactive Patient Education  2017 [employer]  

## 2017-05-23 NOTE — Progress Notes (Signed)
Patient: Sean Wood. Male    DOB: [birth year]   81 y.o.   MRN: [id number] Visit Date: 05/23/2017  Today's Provider: [name], PA   Chief Complaint  Patient presents with  . COPD  . Hyperlipidemia   Subjective:    HPI     COPD, Follow up:  He was last seen for this 3 months ago. Changes made include none.   He reports good compliance with treatment. He is not having side effects.  he is not using rescue inhaler. He IS experiencing fatigue, cough and increased sputum (pt believes this is caused by allergies). He is NOT experiencing dyspnea, weight increase, weight loss, chills or fever. he report breathing is Unchanged.  Pulmonary Functions Testing Results:  No results found for: FEV1, FVC, FEV1FVC, TLC  ------------------------------------------------------------------------   Lipid/Cholesterol, Follow-up:   Last seen for this 3 months ago.  Management changes since that visit include adding low fart diet and red yeast rice. . Last Lipid Panel:    Component Value Date/Time   CHOL 183 12/27/2016 0744   TRIG 73 12/27/2016 0744   HDL 66 12/27/2016 0744   CHOLHDL 2.8 12/27/2016 0744   LDLCALC 102 (H) 12/27/2016 0744    Risk factors for vascular disease include hypercholesterolemia  He reports excellent compliance with treatment. He is not having side effects.  Weight trend: fluctuating a bit Current diet: in general, a "healthy" diet   Current exercise: [place]  Wt Readings from Last 3 Encounters:  05/23/17 154 lb (69.9 kg)  02/01/17 156 lb 9.6 oz (71 kg)  01/24/17 155 lb 3.2 oz (70.4 kg)    ------------------------------------------------------------------- Patient Active Problem List   Diagnosis Date Noted  . Acute exacerbation of chronic obstructive airways disease ([place]) 08/08/2015  . 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[place]) 08/08/2015  . Hypercholesteremia 08/08/2015  . Psoriasis 08/08/2015  . Abnormal  prostate specific antigen 06/01/2013  . CA of prostate ([place]) 06/01/2013  . Elevated prostate specific antigen (PSA) 06/01/2013  . Malignant neoplasm of prostate ([place]) 06/01/2013  . History of colon polyps 04/02/2010  . H/O malignant neoplasm of prostate 04/02/2010   Past Surgical History:  Procedure Laterality Date  . [place]  . CYSTOSCOPY  06/24/2015   Procedure: CYSTOSCOPY;  Surgeon: Hollice Espy, MD;  Location: [facility] ORS;  Service: Urology;;  . HERNIA REPAIR     left and right inguinal hernia repair  . RADIOACTIVE SEED IMPLANT N/A 06/24/2015   Procedure: RADIOACTIVE SEED IMPLANT/BRACHYTHERAPY IMPLANT;  Surgeon: Hollice Espy, MD;  Location: [facility] ORS;  Service: Urology;  Laterality: N/A;   Family History  Problem Relation Age of Onset  . Cancer Brother   . Heart disease Brother   . Diabetes Brother   . Emphysema Brother   . Diabetes Sister   . Emphysema Mother   . Healthy Sister   . Healthy Sister   . Healthy Sister   . Diabetes Brother   . Diabetes Brother   . Diabetes Brother   . Healthy Brother     Allergies  Allergen Reactions  . Mevacor [Lovastatin] Other (See Comments)    "feel washed out"  . Niacin And Related Other (See Comments)    "hot feeling"  . Statins Other (See Comments)    "feel washed out"  . Aleve [Naproxen Sodium] Rash     Current Outpatient Prescriptions:  .  Cholecalciferol (VITAMIN D-3) 1000 UNITS CAPS, Take 1 capsule  by mouth daily. , Disp: , Rfl:  .  Magnesium 400 MG TABS, Take 1 tablet by mouth daily., Disp: , Rfl:  .  Red Yeast Rice 600 MG TABS, Take 2 tablets by mouth at bedtime. , Disp: , Rfl:  .  albuterol (PROAIR HFA) 108 (90 BASE) MCG/ACT inhaler, Reported on 05/24/2016, Disp: , Rfl:  .  Coenzyme Q10 (COQ-10) 100 MG CAPS, Take 100 mg by mouth. Reported on 02/06/2016, Disp: , Rfl:   Review of Systems  Constitutional: Positive for diaphoresis ("hot flashes") and fatigue. Negative for activity change, appetite change,  chills, fever and unexpected weight change.  Respiratory: Positive for cough and wheezing ("sometimes"). Negative for shortness of breath.   Cardiovascular: Negative for chest pain, palpitations and leg swelling.    Social History  Substance Use Topics  . Smoking status: Former Smoker    Packs/day: 1.50    Types: Cigarettes    Quit date: 05/24/1981  . Smokeless tobacco: Current User    Types: Chew  . Alcohol use No   Objective:   BP 102/66 (BP Location: Right Arm, Patient Position: Sitting, Cuff Size: Normal)   Pulse 68   Temp 98 F (36.7 C) (Oral)   Resp 20   Wt 154 lb (69.9 kg)   SpO2 96%   BMI 23.42 kg/m  Vitals:   05/23/17 0818  BP: 102/66  Pulse: 68  Resp: 20  Temp: 98 F (36.7 C)  TempSrc: Oral  SpO2: 96%  Weight: 154 lb (69.9 kg)     Physical Exam  Constitutional: He is oriented to person, place, and time. He appears well-developed and well-nourished. No distress.  HENT:  Head: Normocephalic and atraumatic.  Right Ear: Hearing normal.  Left Ear: Hearing normal.  Nose: Nose normal.  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  Eyes: Conjunctivae and lids are normal. Right eye exhibits no discharge. Left eye exhibits no discharge. No scleral icterus.  Neck: Neck supple.  Cardiovascular: Normal rate.   Pulmonary/Chest: Effort normal. No respiratory distress.  Abdominal: Soft. Bowel sounds are normal.  Musculoskeletal: Normal range of motion.  Neurological: He is alert and oriented to person, place, and time.  Skin: Skin is intact. No lesion and no rash noted.  Psychiatric: He has a normal mood and affect. His speech is normal and behavior is normal. Thought content normal.      Assessment & Plan:     1. Pulmonary emphysema, unspecified emphysema type ([place]) Denies dyspnea or cough. Rare wheeze and has not used Albuterol in the past year. Spirometry shows severe obstructive disease with low capacities. Given sample of Breo 100 mcg one inhalation  a day for 2 weeks. Will get routine labs and follow up pending reports. - Spirometry with Graph - CBC with Differential/Platelet - Comprehensive metabolic panel - fluticasone furoate-vilanterol (BREO ELLIPTA) 100-25 MCG/INH AEPB; Inhale 1 puff into the lungs daily.  Dispense: 14 each; Refill: 0  2. Hypercholesteremia Tolerating Omega-3 and Red Yeast Rice. Following low fat diet. Recheck labs and follow up pending reports. - CBC with Differential/Platelet - Comprehensive metabolic panel - Lipid panel       [name], PA  [facility]

## 2017-05-25 DIAGNOSIS — E7800 Pure hypercholesterolemia, unspecified: Secondary | ICD-10-CM | POA: Diagnosis not present

## 2017-05-25 DIAGNOSIS — J439 Emphysema, unspecified: Secondary | ICD-10-CM | POA: Diagnosis not present

## 2017-05-26 ENCOUNTER — Telehealth: Payer: Self-pay

## 2017-05-26 LAB — LIPID PANEL
CHOL/HDL RATIO: 2.9 ratio (ref 0.0–5.0)
Cholesterol, Total: 197 mg/dL (ref 100–199)
HDL: 67 mg/dL (ref >39)
LDL CALC: 117 mg/dL — AB (ref 0–99)
TRIGLYCERIDES: 65 mg/dL (ref 0–149)
VLDL Cholesterol Cal: 13 mg/dL (ref 5–40)

## 2017-05-26 LAB — COMPREHENSIVE METABOLIC PANEL
A/G RATIO: 1.9 (ref 1.2–2.2)
ALBUMIN: 4.1 g/dL (ref 3.5–4.8)
ALT: 13 IU/L (ref 0–44)
AST: 19 IU/L (ref 0–40)
Alkaline Phosphatase: 79 IU/L (ref 39–117)
BUN / CREAT RATIO: 17 (ref 10–24)
BUN: 15 mg/dL (ref 8–27)
Bilirubin Total: 0.4 mg/dL (ref 0.0–1.2)
CO2: 31 mmol/L — AB (ref 18–29)
CREATININE: 0.89 mg/dL (ref 0.76–1.27)
Calcium: 9.6 mg/dL (ref 8.6–10.2)
Chloride: 102 mmol/L (ref 96–106)
GFR calc Af Amer: 95 mL/min/{1.73_m2} (ref >59)
GFR calc non Af Amer: 82 mL/min/{1.73_m2} (ref >59)
GLOBULIN, TOTAL: 2.2 g/dL (ref 1.5–4.5)
Glucose: 88 mg/dL (ref 65–99)
POTASSIUM: 4.1 mmol/L (ref 3.5–5.2)
SODIUM: 143 mmol/L (ref 134–144)
Total Protein: 6.3 g/dL (ref 6.0–8.5)

## 2017-05-26 LAB — CBC WITH DIFFERENTIAL/PLATELET
BASOS: 0 %
Basophils Absolute: 0 10*3/uL (ref 0.0–0.2)
EOS (ABSOLUTE): 0.2 10*3/uL (ref 0.0–0.4)
EOS: 3 %
HEMATOCRIT: 43.9 % (ref 37.5–51.0)
HEMOGLOBIN: 14.2 g/dL (ref 13.0–17.7)
Immature Grans (Abs): 0 10*3/uL (ref 0.0–0.1)
Immature Granulocytes: 0 %
LYMPHS ABS: 2.2 10*3/uL (ref 0.7–3.1)
Lymphs: 40 %
MCH: 27.2 pg (ref 26.6–33.0)
MCHC: 32.3 g/dL (ref 31.5–35.7)
MCV: 84 fL (ref 79–97)
MONOCYTES: 8 %
MONOS ABS: 0.4 10*3/uL (ref 0.1–0.9)
Neutrophils Absolute: 2.7 10*3/uL (ref 1.4–7.0)
Neutrophils: 49 %
Platelets: 285 10*3/uL (ref 150–379)
RBC: 5.22 x10E6/uL (ref 4.14–5.80)
RDW: 13.7 % (ref 12.3–15.4)
WBC: 5.5 10*3/uL (ref 3.4–10.8)

## 2017-05-26 NOTE — Telephone Encounter (Signed)
-----   Message from Margo Common, [place] sent at 05/26/2017  8:17 AM EDT ----- All blood tests essentially normal except LDL cholesterol slightly elevated. Continue low fat diet and Red Yeast Rice. Recheck progress in 4-6 months.

## 2017-05-26 NOTE — Telephone Encounter (Signed)
Patient's wife Christene advised. (on DPR)

## 2017-10-12 ENCOUNTER — Ambulatory Visit (INDEPENDENT_AMBULATORY_CARE_PROVIDER_SITE_OTHER): Payer: PPO

## 2017-10-12 DIAGNOSIS — Z23 Encounter for immunization: Secondary | ICD-10-CM | POA: Diagnosis not present

## 2017-10-14 ENCOUNTER — Inpatient Hospital Stay: Payer: PPO | Attending: Radiation Oncology

## 2017-10-14 DIAGNOSIS — Z923 Personal history of irradiation: Secondary | ICD-10-CM | POA: Diagnosis not present

## 2017-10-14 DIAGNOSIS — Z79818 Long term (current) use of other agents affecting estrogen receptors and estrogen levels: Secondary | ICD-10-CM | POA: Insufficient documentation

## 2017-10-14 DIAGNOSIS — C61 Malignant neoplasm of prostate: Secondary | ICD-10-CM | POA: Diagnosis not present

## 2017-10-14 LAB — PSA: PROSTATIC SPECIFIC ANTIGEN: 1.92 ng/mL (ref 0.00–4.00)

## 2017-10-21 ENCOUNTER — Encounter: Payer: Self-pay | Admitting: Radiation Oncology

## 2017-10-21 ENCOUNTER — Ambulatory Visit
Admission: RE | Admit: 2017-10-21 | Discharge: 2017-10-21 | Disposition: A | Discharge status: Home / Self Care | Payer: PPO | Source: Ambulatory Visit | Attending: Radiation Oncology | Admitting: Radiation Oncology

## 2017-10-21 ENCOUNTER — Other Ambulatory Visit: Payer: Self-pay | Admitting: *Deleted

## 2017-10-21 VITALS — BP 112/68 | HR 68 | Temp 97.1°F | Wt 155.5 lb

## 2017-10-21 DIAGNOSIS — C61 Malignant neoplasm of prostate: Secondary | ICD-10-CM | POA: Insufficient documentation

## 2017-10-21 DIAGNOSIS — Z923 Personal history of irradiation: Secondary | ICD-10-CM | POA: Insufficient documentation

## 2017-10-21 NOTE — Progress Notes (Signed)
Radiation Oncology Follow up Note  Name: Sean Wood.   Date:   10/21/2017 MRN:  [id number] DOB: 06/09/1936    This 81 y.o. male presents to the clinic today for 2-1/2 year follow-up status post I-125 interstitial implant for salvage after external beam I am RT treatment in 2009.  REFERRING PROVIDER: Margo Common, PA  HPI: Patient is a 81 year old male now out 2 and half years having salvage I-125 interstitial implant after being initially treated in 2009 with image guided I MRT radiation therapy for Gleason 7 (3+4) adenocarcinoma. He is seen today in routine follow-up and clinically he is doing well. Unfortunately his PSA has jumped. From 0.061 year prior now up to 1.9. He has no bone pain. He does complain of continued fatigue. He specifically denies diarrhea dysuria or any other GI/GU complaints.  COMPLICATIONS OF TREATMENT: none  FOLLOW UP COMPLIANCE: keeps appointments   PHYSICAL EXAM:  BP 112/68   Pulse 68   Temp (!) 97.1 F (36.2 C)   Wt 155 lb 8.6 oz (70.5 kg)   BMI 23.65 kg/m  On rectal exam rectal sphincter tone is good. Prostate is smooth contracted without evidence of nodularity or mass. Sulcus is preserved bilaterally. No discrete nodularity is identified. No other rectal abnormalities are noted. Well-developed well-nourished patient in NAD. HEENT reveals PERLA, EOMI, discs not visualized.  Oral cavity is clear. No oral mucosal lesions are identified. Neck is clear without evidence of cervical or supraclavicular adenopathy. Lungs are clear to A&P. Cardiac examination is essentially unremarkable with regular rate and rhythm without murmur rub or thrill. Abdomen is benign with no organomegaly or masses noted. Motor sensory and DTR levels are equal and symmetric in the upper and lower extremities. Cranial nerves II through XII are grossly intact. Proprioception is intact. No peripheral adenopathy or edema is identified. No motor or sensory levels are noted. Crude  visual fields are within normal range.  RADIOLOGY RESULTS: No current films for review  PLAN: At this time I like to wait another 6 months and repeat his PSA. Should it continue to climb will order a bone scan for baseline study and refer to medical oncology. Otherwise at this time I will continue to observe. Follow-up point was made as well as PSA test prior to his next visit. Patient knows to call should he develop any bone pain.  I would like to take this opportunity to thank you for allowing me to participate in the care of your patient.Armstead Peaks., MD

## 2017-11-08 ENCOUNTER — Encounter: Payer: Self-pay | Admitting: Family Medicine

## 2017-11-08 ENCOUNTER — Ambulatory Visit: Payer: PPO | Admitting: Family Medicine

## 2017-11-08 VITALS — BP 108/66 | HR 67 | Temp 98.0°F | Resp 16 | Wt 158.6 lb

## 2017-11-08 DIAGNOSIS — J0100 Acute maxillary sinusitis, unspecified: Secondary | ICD-10-CM | POA: Diagnosis not present

## 2017-11-08 MED ORDER — AMOXICILLIN 875 MG PO TABS: 875.0000 mg | ORAL_TABLET | Freq: Two times a day (BID) | ORAL | 0 refills | Status: DC

## 2017-11-08 NOTE — Progress Notes (Signed)
Patient: Sean Wood. Male    DOB: Apr 24, 1936   81 y.o.   MRN: [id number] Visit Date: 11/08/2017  Today's Provider: [name], PA   Chief Complaint  Patient presents with  . URI   Subjective:    URI   This is a new problem. Episode onset: Friday. The problem has been unchanged. There has been no fever. Associated symptoms include congestion, coughing, rhinorrhea and a sore throat (improved). Treatments tried: OTC cough suppressant. The treatment provided mild relief.   Past Medical History:  Diagnosis Date  . Arthritis   . COPD (chronic obstructive pulmonary disease) ([place])   . Elevated PSA   . Hematuria, gross   . History of hiatal hernia   . HOH (hard of hearing)   . Kidney stone   . Prostate cancer ([place])   . Shortness of breath dyspnea    Family History  Problem Relation Age of Onset  . Cancer Brother   . Heart disease Brother   . Diabetes Brother   . Emphysema Brother   . Diabetes Sister   . Emphysema Mother   . Healthy Sister   . Healthy Sister   . Healthy Sister   . Diabetes Brother   . Diabetes Brother   . Diabetes Brother   . Healthy Brother    Past Surgical History:  Procedure Laterality Date  . [place]  . CYSTOSCOPY  06/24/2015   Performed by Hollice Espy, MD at [facility]  . HERNIA REPAIR     left and right inguinal hernia repair  . RADIOACTIVE SEED IMPLANT/BRACHYTHERAPY IMPLANT N/A 06/24/2015   Performed by Hollice Espy, MD at [facility] ORS   Allergies  Allergen Reactions  . Mevacor [Lovastatin] Other (See Comments)    "feel washed out"  . Niacin And Related Other (See Comments)    "hot feeling"  . Statins Other (See Comments)    "feel washed out"  . Aleve [Naproxen Sodium] Rash     Current Outpatient Medications:  .  albuterol (PROAIR HFA) 108 (90 BASE) MCG/ACT inhaler, Reported on 05/24/2016, Disp: , Rfl:  .  Cholecalciferol (VITAMIN D-3) 1000 UNITS CAPS, Take 1 capsule by mouth daily. , Disp: , Rfl:  .   Coenzyme Q10 (COQ-10) 100 MG CAPS, Take 100 mg by mouth. Reported on 02/06/2016, Disp: , Rfl:  .  fluticasone furoate-vilanterol (BREO ELLIPTA) 100-25 MCG/INH AEPB, Inhale 1 puff into the lungs daily., Disp: 14 each, Rfl: 0 .  Magnesium 400 MG TABS, Take 1 tablet by mouth daily., Disp: , Rfl:  .  Red Yeast Rice 600 MG TABS, Take 2 tablets by mouth at bedtime. , Disp: , Rfl:   Review of Systems  Constitutional: Negative.   HENT: Positive for congestion, rhinorrhea and sore throat (improved).   Respiratory: Positive for cough.   Cardiovascular: Negative.     Social History   Tobacco Use  . Smoking status: Former Smoker    Packs/day: 1.50    Types: Cigarettes    Last attempt to quit: 05/24/1981    Years since quitting: 36.4  . Smokeless tobacco: Current User    Types: Chew  Substance Use Topics  . Alcohol use: No    Alcohol/week: 0.0 oz   Objective:   BP 108/66 (BP Location: Right Arm, Patient Position: Sitting, Cuff Size: Normal)   Pulse 67   Temp 98 F (36.7 C) (Oral)   Resp 16   Wt 158 lb 9.6 oz (71.9  kg)   SpO2 98%   BMI 24.12 kg/m   Physical Exam  Constitutional: He is oriented to person, place, and time. He appears well-developed and well-nourished. No distress.  HENT:  Head: Normocephalic and atraumatic.  Right Ear: Hearing and external ear normal.  Left Ear: Hearing and external ear normal.  Nose: Nose normal.  Reddened soft palate and posterior pharynx without exudates. Cloudy transillumination of maxillary sinuses. No tenderness today.  Eyes: Conjunctivae and lids are normal. Right eye exhibits no discharge. Left eye exhibits no discharge. No scleral icterus.  Cardiovascular: Normal rate and regular rhythm.  Pulmonary/Chest: Effort normal and breath sounds normal. No respiratory distress.  Abdominal: Soft.  Musculoskeletal: Normal range of motion.  Neurological: He is alert and oriented to person, place, and time.  Skin: Skin is intact. No lesion and no rash  noted.  Psychiatric: He has a normal mood and affect. His speech is normal and behavior is normal. Thought content normal.      Assessment & Plan:     1. Acute non-recurrent maxillary sinusitis Onset with rhinorrhea, sore throat with cough from PND. Slight pressure under eyes occasionally. No fever. Increase fluid intake and may use Advil or Tylenol prn aches or pains. Recheck prn. Treat with Robitussin-DM and Amoxil. - amoxicillin (AMOXIL) 875 MG tablet; Take 1 tablet (875 mg total) 2 (two) times daily by mouth.  Dispense: 20 tablet; Refill: Isabel, PA  [facility]

## 2018-01-02 DIAGNOSIS — D3132 Benign neoplasm of left choroid: Secondary | ICD-10-CM | POA: Diagnosis not present

## 2018-01-02 DIAGNOSIS — E7800 Pure hypercholesterolemia, unspecified: Secondary | ICD-10-CM | POA: Diagnosis not present

## 2018-01-02 DIAGNOSIS — H2513 Age-related nuclear cataract, bilateral: Secondary | ICD-10-CM | POA: Diagnosis not present

## 2018-01-02 DIAGNOSIS — H524 Presbyopia: Secondary | ICD-10-CM | POA: Diagnosis not present

## 2018-01-02 DIAGNOSIS — H52223 Regular astigmatism, bilateral: Secondary | ICD-10-CM | POA: Diagnosis not present

## 2018-01-02 DIAGNOSIS — H5203 Hypermetropia, bilateral: Secondary | ICD-10-CM | POA: Diagnosis not present

## 2018-01-02 DIAGNOSIS — H1089 Other conjunctivitis: Secondary | ICD-10-CM | POA: Diagnosis not present

## 2018-04-04 ENCOUNTER — Telehealth: Payer: Self-pay

## 2018-04-04 NOTE — Telephone Encounter (Signed)
Called pt to r/s missed AWV on 03/27/18. Pt states he has a couple apts coming up and would like to check his calender first then will CB and schedule. Advised pt we would CB in a week if we havent heard back from him. Pt stated understanding. -MM

## 2018-04-06 ENCOUNTER — Encounter: Payer: Self-pay | Admitting: Family Medicine

## 2018-04-06 ENCOUNTER — Ambulatory Visit: Payer: PPO | Admitting: Family Medicine

## 2018-04-06 ENCOUNTER — Ambulatory Visit (INDEPENDENT_AMBULATORY_CARE_PROVIDER_SITE_OTHER): Payer: PPO

## 2018-04-06 VITALS — BP 120/60 | HR 60 | Temp 98.1°F | Ht 68.0 in | Wt 151.6 lb

## 2018-04-06 DIAGNOSIS — E7800 Pure hypercholesterolemia, unspecified: Secondary | ICD-10-CM

## 2018-04-06 DIAGNOSIS — Z8546 Personal history of malignant neoplasm of prostate: Secondary | ICD-10-CM

## 2018-04-06 DIAGNOSIS — Z0000 Encounter for general adult medical examination without abnormal findings: Secondary | ICD-10-CM | POA: Diagnosis not present

## 2018-04-06 DIAGNOSIS — J439 Emphysema, unspecified: Secondary | ICD-10-CM

## 2018-04-06 NOTE — Patient Instructions (Addendum)
Sean Wood , Thank you for taking time to come for your Medicare Wellness Visit. I appreciate your ongoing commitment to your health goals. Please review the following plan we discussed and let me know if I can assist you in the future.   Screening recommendations/referrals: Colonoscopy: N/A Recommended yearly ophthalmology/optometry visit for glaucoma screening and checkup Recommended yearly dental visit for hygiene and checkup  Vaccinations: Influenza vaccine: Up to date Pneumococcal vaccine: Up to date Tdap vaccine: Up to date Shingles vaccine: Pt declines today.     Advanced directives: Please bring a copy of your POA (Power of Attorney) and/or Living Will to your next appointment.   Conditions/risks identified: Continue drinking 6-8 glasses of water a day.   Next appointment: 10:00 AM today with Sean Wood  Preventive Care 74 Years and Older, Male Preventive care refers to lifestyle choices and visits with your health care provider that can promote health and wellness. What does preventive care include?  A yearly physical exam. This is also called an annual well check.  Dental exams once or twice a year.  Routine eye exams. Ask your health care provider how often you should have your eyes checked.  Personal lifestyle choices, including:  Daily care of your teeth and gums.  Regular physical activity.  Eating a healthy diet.  Avoiding tobacco and drug use.  Limiting alcohol use.  Practicing safe sex.  Taking low doses of aspirin every day.  Taking vitamin and mineral supplements as recommended by your health care provider. What happens during an annual well check? The services and screenings done by your health care provider during your annual well check will depend on your age, overall health, lifestyle risk factors, and family history of disease. Counseling  Your health care provider may ask you questions about your:  Alcohol use.  Tobacco use.  Drug  use.  Emotional well-being.  Home and relationship well-being.  Sexual activity.  Eating habits.  History of falls.  Memory and ability to understand (cognition).  Work and work Statistician. Screening  You may have the following tests or measurements:  Height, weight, and BMI.  Blood pressure.  Lipid and cholesterol levels. These may be checked every 5 years, or more frequently if you are over 7 years old.  Skin check.  Lung cancer screening. You may have this screening every year starting at age 21 if you have a 30-pack-year history of smoking and currently smoke or have quit within the past 15 years.  Fecal occult blood test (FOBT) of the stool. You may have this test every year starting at age 104.  Flexible sigmoidoscopy or colonoscopy. You may have a sigmoidoscopy every 5 years or a colonoscopy every 10 years starting at age 108.  Prostate cancer screening. Recommendations will vary depending on your family history and other risks.  Hepatitis C blood test.  Hepatitis B blood test.  Sexually transmitted disease (STD) testing.  Diabetes screening. This is done by checking your blood sugar (glucose) after you have not eaten for a while (fasting). You may have this done every 1-3 years.  Abdominal aortic aneurysm (AAA) screening. You may need this if you are a current or former smoker.  Osteoporosis. You may be screened starting at age 68 if you are at high risk. Talk with your health care provider about your test results, treatment options, and if necessary, the need for more tests. Vaccines  Your health care provider may recommend certain vaccines, such as:  Influenza vaccine. This is  recommended every year.  Tetanus, diphtheria, and acellular pertussis (Tdap, Td) vaccine. You may need a Td booster every 10 years.  Zoster vaccine. You may need this after age 38.  Pneumococcal 13-valent conjugate (PCV13) vaccine. One dose is recommended after age  62.  Pneumococcal polysaccharide (PPSV23) vaccine. One dose is recommended after age 50. Talk to your health care provider about which screenings and vaccines you need and how often you need them. This information is not intended to replace advice given to you by your health care provider. Make sure you discuss any questions you have with your health care provider. Document Released: 01/02/2016 Document Revised: 08/25/2016 Document Reviewed: 10/07/2015 Elsevier Interactive Patient Education  2017 [place] Prevention in the Home Falls can cause injuries. They can happen to people of all ages. There are many things you can do to make your home safe and to help prevent falls. What can I do on the outside of my home?  Regularly fix the edges of walkways and driveways and fix any cracks.  Remove anything that might make you trip as you walk through a door, such as a raised step or threshold.  Trim any bushes or trees on the path to your home.  Use bright outdoor lighting.  Clear any walking paths of anything that might make someone trip, such as rocks or tools.  Regularly check to see if handrails are loose or broken. Make sure that both sides of any steps have handrails.  Any raised decks and porches should have guardrails on the edges.  Have any leaves, snow, or ice cleared regularly.  Use sand or salt on walking paths during winter.  Clean up any spills in your garage right away. This includes oil or grease spills. What can I do in the bathroom?  Use night lights.  Install grab bars by the toilet and in the tub and shower. Do not use towel bars as grab bars.  Use non-skid mats or decals in the tub or shower.  If you need to sit down in the shower, use a plastic, non-slip stool.  Keep the floor dry. Clean up any water that spills on the floor as soon as it happens.  Remove soap buildup in the tub or shower regularly.  Attach bath mats securely with double-sided  non-slip rug tape.  Do not have throw rugs and other things on the floor that can make you trip. What can I do in the bedroom?  Use night lights.  Make sure that you have a light by your bed that is easy to reach.  Do not use any sheets or blankets that are too big for your bed. They should not hang down onto the floor.  Have a firm chair that has side arms. You can use this for support while you get dressed.  Do not have throw rugs and other things on the floor that can make you trip. What can I do in the kitchen?  Clean up any spills right away.  Avoid walking on wet floors.  Keep items that you use a lot in easy-to-reach places.  If you need to reach something above you, use a strong step stool that has a grab bar.  Keep electrical cords out of the way.  Do not use floor polish or wax that makes floors slippery. If you must use wax, use non-skid floor wax.  Do not have throw rugs and other things on the floor that can make you trip.  What can I do with my stairs?  Do not leave any items on the stairs.  Make sure that there are handrails on both sides of the stairs and use them. Fix handrails that are broken or loose. Make sure that handrails are as long as the stairways.  Check any carpeting to make sure that it is firmly attached to the stairs. Fix any carpet that is loose or worn.  Avoid having throw rugs at the top or bottom of the stairs. If you do have throw rugs, attach them to the floor with carpet tape.  Make sure that you have a light switch at the top of the stairs and the bottom of the stairs. If you do not have them, ask someone to add them for you. What else can I do to help prevent falls?  Wear shoes that:  Do not have high heels.  Have rubber bottoms.  Are comfortable and fit you well.  Are closed at the toe. Do not wear sandals.  If you use a stepladder:  Make sure that it is fully opened. Do not climb a closed stepladder.  Make sure that both  sides of the stepladder are locked into place.  Ask someone to hold it for you, if possible.  Clearly mark and make sure that you can see:  Any grab bars or handrails.  First and last steps.  Where the edge of each step is.  Use tools that help you move around (mobility aids) if they are needed. These include:  Canes.  Walkers.  Scooters.  Crutches.  Turn on the lights when you go into a dark area. Replace any light bulbs as soon as they burn out.  Set up your furniture so you have a clear path. Avoid moving your furniture around.  If any of your floors are uneven, fix them.  If there are any pets around you, be aware of where they are.  Review your medicines with your doctor. Some medicines can make you feel dizzy. This can increase your chance of falling. Ask your doctor what other things that you can do to help prevent falls. This information is not intended to replace advice given to you by your health care provider. Make sure you discuss any questions you have with your health care provider. Document Released: 10/02/2009 Document Revised: 05/13/2016 Document Reviewed: 01/10/2015 Elsevier Interactive Patient Education  2017 [employer].

## 2018-04-06 NOTE — Progress Notes (Signed)
Patient: Sean Wood. Male    DOB: Apr 11, 1936   82 y.o.   MRN: [id number] Visit Date: 04/06/2018  Today's Provider: [name], PA   Chief Complaint  Patient presents with  . Hyperlipidemia  . Follow-up   Subjective:    HPI Patient is here to follow up from AWE that was done today prior to this appointment. Patient denies any concerns today. He is doing well on current medication regimen.     Lipid/Cholesterol, Follow-up:   Last seen for this 10 months ago.  Management since that visit includes continue low fat diet and Red Yeast Rice  Last Lipid Panel: Lab Results  Component Value Date   CHOL 197 05/25/2017   HDL 67 05/25/2017   LDLCALC 117 (H) 05/25/2017   TRIG 65 05/25/2017   CHOLHDL 2.9 05/25/2017   He reports good compliance with treatment. He is not having side effects.   Wt Readings from Last 3 Encounters:  04/06/18 151 lb 9.6 oz (68.8 kg)  04/06/18 151 lb 9.6 oz (68.8 kg)  11/08/17 158 lb 9.6 oz (71.9 kg)    Allergies  Allergen Reactions  . Mevacor [Lovastatin] Other (See Comments)    "feel washed out"  . Niacin And Related Other (See Comments)    "hot feeling"  . Statins Other (See Comments)    "feel washed out"  . Aleve [Naproxen Sodium] Rash    Current Outpatient Medications:  .  albuterol (PROAIR HFA) 108 (90 BASE) MCG/ACT inhaler, Reported on 05/24/2016, Disp: , Rfl:  .  Cholecalciferol (VITAMIN D-3) 1000 UNITS CAPS, Take 1 capsule by mouth daily. , Disp: , Rfl:  .  Coenzyme Q10 (COQ-10) 100 MG CAPS, Take 100 mg by mouth daily. Reported on 02/06/2016, Disp: , Rfl:  .  Magnesium 400 MG TABS, Take 1 tablet by mouth every other day. , Disp: , Rfl:  .  Red Yeast Rice 600 MG TABS, Take 1 tablet by mouth at bedtime. , Disp: , Rfl:  .  fluticasone furoate-vilanterol (BREO ELLIPTA) 100-25 MCG/INH AEPB, Inhale 1 puff into the lungs daily. (Patient not taking: Reported on 04/06/2018), Disp: 14 each, Rfl: 0  Review of Systems    Constitutional: Negative.   Respiratory: Negative.   Cardiovascular: Negative.   Musculoskeletal: Negative.     Social History   Tobacco Use  . Smoking status: Former Smoker    Packs/day: 1.50    Types: Cigarettes    Last attempt to quit: 05/24/1981    Years since quitting: 36.8  . Smokeless tobacco: Current User    Types: Chew  Substance Use Topics  . Alcohol use: No    Alcohol/week: 0.0 oz   Objective:   BP 120/60 (BP Location: Right Arm, Patient Position: Sitting, Cuff Size: Normal)   Pulse 60   Temp 98.1 F (36.7 C) (Oral)   Ht 5\' 8"  (1.727 m)   Wt 151 lb 9.6 oz (68.8 kg)   BMI 23.05 kg/m   Physical Exam  Constitutional: He is oriented to person, place, and time. He appears well-developed and well-nourished.  HENT:  Head: Normocephalic and atraumatic.  Right Ear: External ear normal.  Left Ear: External ear normal.  Nose: Nose normal.  Mouth/Throat: Oropharynx is clear and moist.  Eyes: Pupils are equal, round, and reactive to light. Conjunctivae and EOM are normal. Right eye exhibits no discharge.  Neck: Normal range of motion. Neck supple. No tracheal deviation present. No thyromegaly present.  Cardiovascular: Normal rate, regular  rhythm, normal heart sounds and intact distal pulses.  No murmur heard. Pulmonary/Chest: Effort normal and breath sounds normal. No respiratory distress. He has no wheezes. He has no rales. He exhibits no tenderness.  Abdominal: Soft. He exhibits no distension and no mass. There is no tenderness. There is no rebound and no guarding.  Genitourinary:  Genitourinary Comments: Exam deferred to oncologist due to history of prostate cancer - to see Dr. Baruch Gouty 05-05-18.  Musculoskeletal: Normal range of motion. He exhibits no edema or tenderness.  Lymphadenopathy:    He has no cervical adenopathy.  Neurological: He is alert and oriented to person, place, and time. He has normal reflexes. He displays normal reflexes. No cranial nerve deficit.  He exhibits normal muscle tone. Coordination normal.  Skin: Skin is warm and dry. No rash noted. No erythema.  Psychiatric: He has a normal mood and affect. His behavior is normal. Judgment and thought content normal.       Assessment & Plan:     1. Hypercholesteremia Tolerating the Red Yeast Rice with Co-Q 10 without side effects. Weight is stable. Will recheck labs to assess progress. - CBC with Differential/Platelet - Comprehensive metabolic panel - Lipid panel - TSH  2. H/O malignant neoplasm of prostate No incontinence or urinary retention episodes. Still seeing oncologist (Dr. Baruch Gouty) regularly - next appointment 05-05-18 for recheck of PSA. States he will refuse any chemotherapy. - CBC with Differential/Platelet  3. Pulmonary emphysema, unspecified emphysema type ([place]) No significant shortness of breath without congestion. Uses Albuterol inhaler prn only. No smoking. No longer feels he needs the Memory Dance he was on a year ago. Will recheck labs for sign of infection or anemia.  - CBC with Differential/Platelet       [name], PA  [facility]

## 2018-04-06 NOTE — Telephone Encounter (Signed)
Completed today, 04/06/18. -MM

## 2018-04-06 NOTE — Progress Notes (Addendum)
Subjective:   Sean Wood. is a 82 y.o. male who presents for Medicare Annual/Subsequent preventive examination.  Review of Systems:  N/A Cardiac Risk Factors include: advanced age (>61men, >4 women);male gender     Objective:    Vitals: BP 120/60 (BP Location: Left Arm)   Pulse 60   Temp 98.1 F (36.7 C) (Oral)   Ht 5\' 8"  (1.727 m)   Wt 151 lb 9.6 oz (68.8 kg)   BMI 23.05 kg/m   Body mass index is 23.05 kg/m.  Advanced Directives 04/06/2018 10/21/2017 11/23/2016 10/11/2016 11/24/2015 07/22/2015 06/24/2015  Does Patient Have a Medical Advance Directive? Yes No Yes No Yes Yes Yes  Type of Advance Directive Living will - Living will;Healthcare Power of Attorney - Living will [place];Living will [place];Living will  Does patient want to make changes to medical advance directive? - - - - No - Patient declined No - Patient declined -  Copy of [place] in Chart? - - Yes - - No - copy requested No - copy requested  Would patient like information on creating a medical advance directive? - No - Patient declined - No - patient declined information - - -    Tobacco Social History   Tobacco Use  Smoking Status Former Smoker  . Packs/day: 1.50  . Types: Cigarettes  . Last attempt to quit: 05/24/1981  . Years since quitting: 36.8  Smokeless Tobacco Current User  . Types: Chew     Ready to quit: Not Answered Counseling given: Not Answered   Clinical Intake:  Pre-visit preparation completed: Yes  Pain : No/denies pain Pain Score: 0-No pain     Nutritional Status: BMI of 19-24  Normal Nutritional Risks: None Diabetes: No  How often do you need to have someone help you when you read instructions, pamphlets, or other written materials from your doctor or pharmacy?: 1 - Never  Interpreter Needed?: No  Information entered by :: [place], LPN  Past Medical History:  Diagnosis Date  . Arthritis   . COPD  (chronic obstructive pulmonary disease) ([place])   . Elevated PSA   . Hematuria, gross   . History of hiatal hernia   . HOH (hard of hearing)   . Kidney stone   . Prostate cancer ([place])   . Shortness of breath dyspnea    Past Surgical History:  Procedure Laterality Date  . [place]  . CYSTOSCOPY  06/24/2015   Procedure: CYSTOSCOPY;  Surgeon: Hollice Espy, MD;  Location: [facility] ORS;  Service: Urology;;  . HERNIA REPAIR     left and right inguinal hernia repair  . RADIOACTIVE SEED IMPLANT N/A 06/24/2015   Procedure: RADIOACTIVE SEED IMPLANT/BRACHYTHERAPY IMPLANT;  Surgeon: Hollice Espy, MD;  Location: [facility] ORS;  Service: Urology;  Laterality: N/A;   Family History  Problem Relation Age of Onset  . Cancer Brother   . Heart disease Brother   . Diabetes Brother   . Emphysema Brother   . Diabetes Sister   . Emphysema Mother   . Healthy Sister   . Healthy Sister   . Healthy Sister   . Diabetes Brother   . Diabetes Brother   . Diabetes Brother   . Healthy Brother    Social History   Socioeconomic History  . Marital status: Married    Spouse name: Not on file  . Number of children: 1  . Years of education: Not on file  .  Highest education level: 8th grade  Occupational History  . 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 Food insecurity:    Worry: Never true    Inability: Never true  . Transportation needs:    Medical: No    Non-medical: No  Tobacco Use  . Smoking status: Former Smoker    Packs/day: 1.50    Types: Cigarettes    Last attempt to quit: 05/24/1981    Years since quitting: 36.8  . Smokeless tobacco: Current User    Types: Chew  Substance and Sexual Activity  . Alcohol use: No    Alcohol/week: 0.0 oz  . Drug use: No  . Sexual activity: Not on file  Lifestyle  . Physical activity:    Days per week: Not on file    Minutes per session: Not on file  . Stress: Only a little  Relationships  . Social  connections:    Talks on phone: Not on file    Gets together: Not on file    Attends religious service: Not on file    Active member of club or organization: Not on file    Attends meetings of clubs or organizations: Not on file    Relationship status: Not on file  Other Topics Concern  . Not on file  Social History Narrative  . Not on file    Outpatient Encounter Medications as of 04/06/2018  Medication Sig  . Cholecalciferol (VITAMIN D-3) 1000 UNITS CAPS Take 1 capsule by mouth daily.   . Coenzyme Q10 (COQ-10) 100 MG CAPS Take 100 mg by mouth daily. Reported on 02/06/2016  . Magnesium 400 MG TABS Take 1 tablet by mouth every other day.   . Red Yeast Rice 600 MG TABS Take 1 tablet by mouth at bedtime.   Mar[place] Kitchen albuterol (PROAIR HFA) 108 (90 BASE) MCG/ACT inhaler Reported on 05/24/2016  . fluticasone furoate-vilanterol (BREO ELLIPTA) 100-25 MCG/INH AEPB Inhale 1 puff into the lungs daily. (Patient not taking: Reported on 04/06/2018)   No facility-administered encounter medications on file as of 04/06/2018.     Activities of Daily Living In your present state of health, do you have any difficulty performing the following activities: 04/06/2018  Hearing? Y  Comment Pt cannot hear out of left ear, not intersted in hearing aids.  Vision? N  Difficulty concentrating or making decisions? N  Walking or climbing stairs? N  Dressing or bathing? N  Doing errands, shopping? N  Preparing Food and eating ? N  Using the Toilet? N  In the past six months, have you accidently leaked urine? N  Do you have problems with loss of bowel control? N  Managing your Medications? N  Managing your Finances? N  Housekeeping or managing your Housekeeping? N  Some recent data might be hidden    Patient Care Team: Chrismon, Vickki Muff, PA as PCP - General (Physician Assistant) Garrel Ridgel, DPM as Consulting Physician (Podiatry) Noreene Filbert, MD as Referring Physician (Radiation Oncology) Rayetta Humphrey as  Consulting Physician (Optometry)   Assessment:   This is a routine wellness examination for [place].  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 DIET - WATER INTAKE     Continue drinking 6-8 glasses of water a day.        Fall Risk  Fall Risk  04/06/2018 11/23/2016 10/11/2016 11/24/2015 07/22/2015  Falls in the past year? No Yes No No No  Number falls in past yr: - 1 - - -  Injury with Fall? - No - - -   Is the patient's home free of loose throw rugs in walkways, pet beds, electrical cords, etc?   yes      Grab bars in the bathroom? no      Handrails on the stairs?   yes      Adequate lighting?   yes  Timed Get Up and Go Performed: N/A  Depression Screen PHQ 2/9 Scores 04/06/2018 11/23/2016 10/11/2016 11/24/2015  PHQ - 2 Score 0 0 0 0    Cognitive Function: Pt declined screening today.      6CIT Screen 11/23/2016  What Year? 0 points  What month? 0 points  What time? 0 points  Count back from 20 2 points  Months in reverse 0 points  Repeat phrase 4 points  Total Score 6    Immunization History  Administered Date(s) Administered  . Influenza Split 09/12/2010  . Influenza, High Dose Seasonal PF 11/23/2016, 10/12/2017  . Pneumococcal Conjugate-13 11/23/2016  . Pneumococcal Polysaccharide-23 04/11/2012  . Tdap 10/14/2011    Qualifies for Shingles Vaccine? Due for Shingles vaccine. Declined my offer to administer today. Education has been provided regarding the importance of this vaccine. Pt has been advised to call her insurance company to determine her out of pocket expense. Advised she may also receive this vaccine at her local pharmacy or Health Dept. Verbalized acceptance and understanding.  Screening Tests Health Maintenance  Topic Date Due  . INFLUENZA VACCINE  07/20/2018  . TETANUS/TDAP  10/13/2021  .  PNA vac Low Risk Adult  Completed   Cancer Screenings: Lung: Low Dose CT Chest recommended if Age 97-80 years, 30 pack-year currently smoking OR have quit w/in 15years. Patient does not qualify. Colorectal: N/A  Additional Screenings:  Hepatitis C Screening: N/A      Plan:  I have personally reviewed and addressed the Medicare Annual Wellness questionnaire and have noted the following in the patient's chart:  A. Medical and social history B. Use of alcohol, tobacco or illicit drugs  C. Current medications and supplements D. Functional ability and status E.  Nutritional status F.  Physical activity G. Advance directives H. List of other physicians I.  Hospitalizations, surgeries, and ER visits in previous 12 months J.  [place] such as hearing and vision if needed, cognitive and depression L. Referrals and appointments - none  In addition, I have reviewed and discussed with patient certain preventive protocols, quality metrics, and best practice recommendations. A written personalized care plan for preventive services as well as general preventive health recommendations were provided to patient.  See attached scanned questionnaire for additional information.   Signed,  Fabio Neighbors, LPN Nurse Health Advisor   Nurse Recommendations: None.   Reviewed documentation and recommendations of Nurse Health Advisor and agree with plan. Was available for consultation during screening.

## 2018-04-06 NOTE — Progress Notes (Deleted)
Patient: Sean Hidrogo., Male    DOB: [birth year], 82 y.o.   MRN: [id number] Visit Date: 04/06/2018  Today's Provider: [name], PA   No chief complaint on file.  Subjective:     Complete Physical Sean Beadnell. is a 82 y.o. male. He feels {DESC; WELL/FAIRLY WELL/POORLY:18703}. He reports exercising ***. He reports he is sleeping {DESC; WELL/FAIRLY WELL/POORLY:18703}.  -----------------------------------------------------------   Review of Systems  Constitutional: Negative.   HENT: Negative.   Eyes: Negative.   Respiratory: Negative.   Cardiovascular: Negative.   Gastrointestinal: Negative.   Endocrine: Negative.   Genitourinary: Negative.   Musculoskeletal: Negative.   Skin: Negative.   Allergic/Immunologic: Negative.   Neurological: Negative.   Hematological: Negative.   Psychiatric/Behavioral: Negative.     Social History   Socioeconomic History  . Marital status: Married    Spouse name: Not on file  . Number of children: Not on file  . Years of education: Not on file  . Highest education level: Not on file  Occupational History  . Not on file  Social Needs  . Financial resource strain: Not on file  . Food insecurity:    Worry: Not on file    Inability: Not on file  . Transportation needs:    Medical: Not on file    Non-medical: Not on file  Tobacco Use  . Smoking status: Former Smoker    Packs/day: 1.50    Types: Cigarettes    Last attempt to quit: 05/24/1981    Years since quitting: 36.8  . Smokeless tobacco: Current User    Types: Chew  Substance and Sexual Activity  . Alcohol use: No    Alcohol/week: 0.0 oz  . Drug use: No  . Sexual activity: Not on file  Lifestyle  . Physical activity:    Days per week: Not on file    Minutes per session: Not on file  . Stress: Not on file  Relationships  . Social connections:    Talks on phone: Not on file    Gets together: Not on file    Attends religious service: Not on file     Active member of club or organization: Not on file    Attends meetings of clubs or organizations: Not on file    Relationship status: Not on file  . Intimate partner violence:    Fear of current or ex partner: Not on file    Emotionally abused: Not on file    Physically abused: Not on file    Forced sexual activity: Not on file  Other Topics Concern  . Not on file  Social History Narrative  . Not on file    Past Medical History:  Diagnosis Date  . Arthritis   . COPD (chronic obstructive pulmonary disease) ([place])   . Elevated PSA   . Hematuria, gross   . History of hiatal hernia   . HOH (hard of hearing)   . Kidney stone   . Prostate cancer ([place])   . Shortness of breath dyspnea      Patient Active Problem List   Diagnosis Date Noted  . Acute exacerbation of chronic obstructive airways disease ([place]) 08/08/2015  . 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[place]) 08/08/2015  . Hypercholesteremia 08/08/2015  . Psoriasis 08/08/2015  . Abnormal prostate specific antigen 06/01/2013  . CA of prostate ([place]) 06/01/2013  . Elevated prostate specific antigen (PSA) 06/01/2013  . Malignant neoplasm of prostate ([place]) 06/01/2013  .  History of colon polyps 04/02/2010  . H/O malignant neoplasm of prostate 04/02/2010    Past Surgical History:  Procedure Laterality Date  . [place]  . CYSTOSCOPY  06/24/2015   Procedure: CYSTOSCOPY;  Surgeon: Hollice Espy, MD;  Location: [facility] ORS;  Service: Urology;;  . HERNIA REPAIR     left and right inguinal hernia repair  . RADIOACTIVE SEED IMPLANT N/A 06/24/2015   Procedure: RADIOACTIVE SEED IMPLANT/BRACHYTHERAPY IMPLANT;  Surgeon: Hollice Espy, MD;  Location: [facility] ORS;  Service: Urology;  Laterality: N/A;    His family history includes Cancer in his brother; Diabetes in his brother, brother, brother, brother, and sister; Emphysema in his brother and mother; Healthy in his brother, sister, sister, and sister; Heart disease in his  brother.      Current Outpatient Medications:  .  albuterol (PROAIR HFA) 108 (90 BASE) MCG/ACT inhaler, Reported on 05/24/2016, Disp: , Rfl:  .  Cholecalciferol (VITAMIN D-3) 1000 UNITS CAPS, Take 1 capsule by mouth daily. , Disp: , Rfl:  .  Coenzyme Q10 (COQ-10) 100 MG CAPS, Take 100 mg by mouth. Reported on 02/06/2016, Disp: , Rfl:  .  fluticasone furoate-vilanterol (BREO ELLIPTA) 100-25 MCG/INH AEPB, Inhale 1 puff into the lungs daily., Disp: 14 each, Rfl: 0 .  Magnesium 400 MG TABS, Take 1 tablet by mouth daily., Disp: , Rfl:  .  Red Yeast Rice 600 MG TABS, Take 2 tablets by mouth at bedtime. , Disp: , Rfl:   Patient Care Team: Chrismon, Vickki Muff, PA as PCP - General (Physician Assistant) Garrel Ridgel, DPM as Consulting Physician (Podiatry) Noreene Filbert, MD as Referring Physician (Radiation Oncology)     Objective:   Vitals: There were no vitals taken for this visit.  Physical Exam  Activities of Daily Living No flowsheet data found.  Fall Risk Assessment Fall Risk  11/23/2016 10/11/2016 11/24/2015 07/22/2015 05/26/2015  Falls in the past year? Yes No No No No  Number falls in past yr: 1 - - - -  Injury with Fall? No - - - -     Depression Screen PHQ 2/9 Scores 11/23/2016 10/11/2016 11/24/2015 07/22/2015  PHQ - 2 Score 0 0 0 0     Assessment & Plan:    Annual Physical Reviewed patient's Family Medical History Reviewed and updated list of patient's medical providers Assessment of cognitive impairment was done Assessed patient's functional ability Established a written schedule for health screening [place] Completed and Reviewed  Exercise Activities and Dietary recommendations Goals    None      Immunization History  Administered Date(s) Administered  . Influenza Split 09/12/2010  . Influenza, High Dose Seasonal PF 11/23/2016, 10/12/2017  . Pneumococcal Conjugate-13 11/23/2016  . Pneumococcal Polysaccharide-23 04/11/2012  . Tdap 10/14/2011      Health Maintenance  Topic Date Due  . INFLUENZA VACCINE  07/20/2018  . TETANUS/TDAP  10/13/2021  . PNA vac Low Risk Adult  Completed     Discussed health benefits of physical activity, and encouraged him to engage in regular exercise appropriate for his age and condition.    ------------------------------------------------------------------------------------------------------------    [name], [place]

## 2018-04-11 DIAGNOSIS — E7800 Pure hypercholesterolemia, unspecified: Secondary | ICD-10-CM | POA: Diagnosis not present

## 2018-04-11 DIAGNOSIS — Z8546 Personal history of malignant neoplasm of prostate: Secondary | ICD-10-CM | POA: Diagnosis not present

## 2018-04-11 DIAGNOSIS — J439 Emphysema, unspecified: Secondary | ICD-10-CM | POA: Diagnosis not present

## 2018-04-12 LAB — CBC WITH DIFFERENTIAL/PLATELET
BASOS ABS: 0 10*3/uL (ref 0.0–0.2)
Basos: 0 %
EOS (ABSOLUTE): 0.1 10*3/uL (ref 0.0–0.4)
Eos: 2 %
Hematocrit: 45.4 % (ref 37.5–51.0)
Hemoglobin: 15.5 g/dL (ref 13.0–17.7)
IMMATURE GRANS (ABS): 0 10*3/uL (ref 0.0–0.1)
IMMATURE GRANULOCYTES: 0 %
Lymphocytes Absolute: 1.8 10*3/uL (ref 0.7–3.1)
Lymphs: 41 %
MCH: 28.7 pg (ref 26.6–33.0)
MCHC: 34.1 g/dL (ref 31.5–35.7)
MCV: 84 fL (ref 79–97)
MONOCYTES: 8 %
MONOS ABS: 0.3 10*3/uL (ref 0.1–0.9)
Neutrophils Absolute: 2.2 10*3/uL (ref 1.4–7.0)
Neutrophils: 49 %
PLATELETS: 289 10*3/uL (ref 150–379)
RBC: 5.41 x10E6/uL (ref 4.14–5.80)
RDW: 14.2 % (ref 12.3–15.4)
WBC: 4.5 10*3/uL (ref 3.4–10.8)

## 2018-04-12 LAB — LIPID PANEL
CHOL/HDL RATIO: 3.9 ratio (ref 0.0–5.0)
Cholesterol, Total: 214 mg/dL — ABNORMAL HIGH (ref 100–199)
HDL: 55 mg/dL (ref >39)
LDL Calculated: 142 mg/dL — ABNORMAL HIGH (ref 0–99)
TRIGLYCERIDES: 87 mg/dL (ref 0–149)
VLDL CHOLESTEROL CAL: 17 mg/dL (ref 5–40)

## 2018-04-12 LAB — COMPREHENSIVE METABOLIC PANEL
ALT: 7 IU/L (ref 0–44)
AST: 18 IU/L (ref 0–40)
Albumin/Globulin Ratio: 2 (ref 1.2–2.2)
Albumin: 4.1 g/dL (ref 3.5–4.8)
Alkaline Phosphatase: 80 IU/L (ref 39–117)
BUN/Creatinine Ratio: 13 (ref 10–24)
BUN: 14 mg/dL (ref 8–27)
Bilirubin Total: 0.4 mg/dL (ref 0.0–1.2)
CALCIUM: 9.4 mg/dL (ref 8.6–10.2)
CO2: 29 mmol/L (ref 20–29)
CREATININE: 1.06 mg/dL (ref 0.76–1.27)
Chloride: 103 mmol/L (ref 96–106)
GFR, EST AFRICAN AMERICAN: 77 mL/min/{1.73_m2} (ref >59)
GFR, EST NON AFRICAN AMERICAN: 66 mL/min/{1.73_m2} (ref >59)
GLUCOSE: 76 mg/dL (ref 65–99)
Globulin, Total: 2.1 g/dL (ref 1.5–4.5)
Potassium: 5 mmol/L (ref 3.5–5.2)
Sodium: 147 mmol/L — ABNORMAL HIGH (ref 134–144)
TOTAL PROTEIN: 6.2 g/dL (ref 6.0–8.5)

## 2018-04-12 LAB — TSH: TSH: 1.44 u[IU]/mL (ref 0.450–4.500)

## 2018-04-28 ENCOUNTER — Inpatient Hospital Stay: Payer: PPO | Attending: Radiation Oncology

## 2018-04-28 ENCOUNTER — Other Ambulatory Visit: Payer: Self-pay

## 2018-04-28 DIAGNOSIS — C61 Malignant neoplasm of prostate: Secondary | ICD-10-CM

## 2018-04-28 DIAGNOSIS — Z87891 Personal history of nicotine dependence: Secondary | ICD-10-CM | POA: Diagnosis not present

## 2018-04-28 DIAGNOSIS — Z8042 Family history of malignant neoplasm of prostate: Secondary | ICD-10-CM | POA: Diagnosis not present

## 2018-04-28 LAB — PSA: Prostatic Specific Antigen: 9.64 ng/mL — ABNORMAL HIGH (ref 0.00–4.00)

## 2018-05-05 ENCOUNTER — Other Ambulatory Visit: Payer: Self-pay | Admitting: *Deleted

## 2018-05-05 ENCOUNTER — Encounter: Payer: Self-pay | Admitting: Radiation Oncology

## 2018-05-05 ENCOUNTER — Ambulatory Visit
Admission: RE | Admit: 2018-05-05 | Discharge: 2018-05-05 | Disposition: A | Discharge status: Home / Self Care | Payer: PPO | Source: Ambulatory Visit | Attending: Radiation Oncology | Admitting: Radiation Oncology

## 2018-05-05 ENCOUNTER — Other Ambulatory Visit: Payer: Self-pay

## 2018-05-05 VITALS — BP 102/60 | Temp 96.4°F | Resp 20 | Wt 149.6 lb

## 2018-05-05 DIAGNOSIS — C61 Malignant neoplasm of prostate: Secondary | ICD-10-CM | POA: Diagnosis not present

## 2018-05-05 DIAGNOSIS — Z923 Personal history of irradiation: Secondary | ICD-10-CM | POA: Diagnosis not present

## 2018-05-05 NOTE — Progress Notes (Signed)
Radiation Oncology Follow up Note  Name: Sean Wood.   Date:   05/05/2018 MRN:  [id number] DOB: Dec 28, 1935    This 82 y.o. male presents to the clinic today for 3 year follow-up status post I-125 interstitial implant for salvage after I MRT radiation therapy back in 2009.  REFERRING PROVIDER: Margo Common, PA  HPI: patient is a 82 year old male we treated out now 3 years prior with I-125 interstitial implant after initially being treated in 2009 with image guided I MRT radiation therapy for Gleason 7 (3+4) adenocarcinoma of the prostate. His PSAs have been fairly stable we did dose him with Lupron back in December 2017. His most recent PSA this month was 9.64. He specifically denies bone pain any increased lower urinary tract symptoms or diarrhea..  COMPLICATIONS OF TREATMENT: none  FOLLOW UP COMPLIANCE: keeps appointments   PHYSICAL EXAM:  BP 102/60   Temp (!) 96.4 F (35.8 C)   Resp 20   Wt 149 lb 9.3 oz (67.8 kg)   BMI 22.74 kg/m  Well-developed well-nourished patient in NAD. HEENT reveals PERLA, EOMI, discs not visualized.  Oral cavity is clear. No oral mucosal lesions are identified. Neck is clear without evidence of cervical or supraclavicular adenopathy. Lungs are clear to A&P. Cardiac examination is essentially unremarkable with regular rate and rhythm without murmur rub or thrill. Abdomen is benign with no organomegaly or masses noted. Motor sensory and DTR levels are equal and symmetric in the upper and lower extremities. Cranial nerves II through XII are grossly intact. Proprioception is intact. No peripheral adenopathy or edema is identified. No motor or sensory levels are noted. Crude visual fields are within normal range.  RADIOLOGY RESULTS: no current films for review  PLAN: present time of ordered another four-month Lupron Depot injection. I'm off also referring the patient to medical oncology for their input at this time based on significant spike in his  PSA. I've asked to see him back in 6 months for follow-up.Appointments were made. Patient is to call with any concerns.  I would like to take this opportunity to thank you for allowing me to participate in the care of your patient.Noreene Filbert, MD

## 2018-05-09 ENCOUNTER — Other Ambulatory Visit: Payer: Self-pay | Admitting: *Deleted

## 2018-05-09 DIAGNOSIS — C61 Malignant neoplasm of prostate: Secondary | ICD-10-CM

## 2018-05-09 MED ORDER — LEUPROLIDE ACETATE (4 MONTH) 30 MG IM KIT: 30.0000 mg | PACK | Freq: Once | INTRAMUSCULAR | Status: DC

## 2018-05-10 ENCOUNTER — Inpatient Hospital Stay: Payer: PPO

## 2018-05-10 ENCOUNTER — Inpatient Hospital Stay (HOSPITAL_BASED_OUTPATIENT_CLINIC_OR_DEPARTMENT_OTHER): Payer: PPO | Admitting: Oncology

## 2018-05-10 ENCOUNTER — Encounter: Payer: Self-pay | Admitting: Oncology

## 2018-05-10 ENCOUNTER — Other Ambulatory Visit: Payer: Self-pay

## 2018-05-10 VITALS — BP 115/68 | HR 62 | Temp 97.6°F | Resp 18 | Wt 149.6 lb

## 2018-05-10 DIAGNOSIS — Z87891 Personal history of nicotine dependence: Secondary | ICD-10-CM

## 2018-05-10 DIAGNOSIS — C61 Malignant neoplasm of prostate: Secondary | ICD-10-CM

## 2018-05-10 DIAGNOSIS — Z8042 Family history of malignant neoplasm of prostate: Secondary | ICD-10-CM

## 2018-05-10 LAB — CBC WITH DIFFERENTIAL/PLATELET
BASOS ABS: 0.1 10*3/uL (ref 0–0.1)
Basophils Relative: 1 %
EOS ABS: 0.1 10*3/uL (ref 0–0.7)
EOS PCT: 1 %
HCT: 48.1 % (ref 40.0–52.0)
Hemoglobin: 16.2 g/dL (ref 13.0–18.0)
LYMPHS PCT: 35 %
Lymphs Abs: 2 10*3/uL (ref 1.0–3.6)
MCH: 29.3 pg (ref 26.0–34.0)
MCHC: 33.6 g/dL (ref 32.0–36.0)
MCV: 87.1 fL (ref 80.0–100.0)
MONO ABS: 0.4 10*3/uL (ref 0.2–1.0)
Monocytes Relative: 6 %
Neutro Abs: 3.3 10*3/uL (ref 1.4–6.5)
Neutrophils Relative %: 57 %
PLATELETS: 267 10*3/uL (ref 150–440)
RBC: 5.53 MIL/uL (ref 4.40–5.90)
RDW: 14.3 % (ref 11.5–14.5)
WBC: 5.8 10*3/uL (ref 3.8–10.6)

## 2018-05-10 LAB — COMPREHENSIVE METABOLIC PANEL
ALK PHOS: 77 U/L (ref 38–126)
ALT: 13 U/L — AB (ref 17–63)
AST: 24 U/L (ref 15–41)
Albumin: 4.2 g/dL (ref 3.5–5.0)
Anion gap: 9 (ref 5–15)
BUN: 19 mg/dL (ref 6–20)
CALCIUM: 9.6 mg/dL (ref 8.9–10.3)
CO2: 28 mmol/L (ref 22–32)
CREATININE: 1.13 mg/dL (ref 0.61–1.24)
Chloride: 102 mmol/L (ref 101–111)
GFR calc Af Amer: >60 mL/min (ref >60)
GFR calc non Af Amer: 60 mL/min — ABNORMAL LOW (ref >60)
Glucose, Bld: 94 mg/dL (ref 65–99)
Potassium: 4.5 mmol/L (ref 3.5–5.1)
Sodium: 139 mmol/L (ref 135–145)
Total Bilirubin: 0.6 mg/dL (ref 0.3–1.2)
Total Protein: 7.5 g/dL (ref 6.5–8.1)

## 2018-05-10 MED ORDER — LEUPROLIDE ACETATE (4 MONTH) 30 MG IM KIT
30.0000 mg | PACK | Freq: Once | INTRAMUSCULAR | Status: AC
  Administered 2018-05-10: 30 mg via INTRAMUSCULAR
  Filled 2018-05-10: qty 30

## 2018-05-10 NOTE — Progress Notes (Signed)
New patient referred by Dr Donella Stade, with history of prostate cancer in 2009.

## 2018-05-10 NOTE — Progress Notes (Addendum)
Hematology/Oncology Consult note [facility] Telephone:([phone] Fax:([phone]) [phone]   Patient Care Team: Chrismon, Vickki Muff, PA as PCP - General (Physician Assistant) Garrel Ridgel, DPM as Consulting Physician (Podiatry) Noreene Filbert, MD as Referring Physician (Radiation Oncology) Idelle Leech, OD as Consulting Physician (Optometry)  REFERRING PROVIDER: Dr.Chrystal CHIEF COMPLAINTS/PURPOSE OF CONSULTATION:  Evaluation of prostate cancer  HISTORY OF PRESENTING ILLNESS:  Sean Feagans. is a  82 y.o.  male with PMH listed below who was referred to me for evaluation of prostate cancer.   Cancer history dated back to 2009 when He was diagnosed with intermediate risk, Gleason 3+4, T1c prostate cancer in 2009. PSA at diagnosis was 6.1. IMRT was completed in August 2009. PSA nadir was 0.5. His PSA January 2013 was 0.8 and in January 2014 was 1.3. This was repeated in May 2014 and had increased to 1.8. He elected a repeat prostate biopsy in November 2014 after his PSA in October had increased to 2.2. This showed a focus of Gleason 3+3 adenocarcinoma from the right prostate involving less than 5%. He elected surveillance after discussing options. In May 2016, he was evaluated by Dr.Chrystal and he received salvage seed implantation with I-125. He subsequently follows with Dr.Chrystal for Lupron every 4 months since then. Achieve PSA nadir of 0.06 in 09/2016, and started to trend up and most recently trended up to 9.64.   Patient reports feeling tired, otherwise fine. He has good appetite. Denies bone pain.  Review of Systems  Constitutional: Positive for malaise/fatigue. Negative for chills, fever and weight loss.  HENT: Negative for congestion, ear discharge, ear pain, nosebleeds, sinus pain and sore throat.   Eyes: Negative for double vision, photophobia, pain, discharge and redness.  Respiratory: Negative for cough, hemoptysis, sputum production, shortness of  breath and wheezing.   Cardiovascular: Negative for chest pain, palpitations, orthopnea, claudication and leg swelling.  Gastrointestinal: Negative for abdominal pain, blood in stool, constipation, diarrhea, heartburn, melena, nausea and vomiting.  Genitourinary: Negative for dysuria, flank pain, frequency and hematuria.  Musculoskeletal: Negative for back pain, myalgias and neck pain.  Skin: Negative for itching and rash.  Neurological: Negative for dizziness, tingling, tremors, focal weakness, weakness and headaches.  Endo/Heme/Allergies: Negative for environmental allergies. Does not bruise/bleed easily.  Psychiatric/Behavioral: Negative for depression and hallucinations. The patient is not nervous/anxious.     MEDICAL HISTORY:  Past Medical History:  Diagnosis Date  . Arthritis   . COPD (chronic obstructive pulmonary disease) ([place])   . Elevated PSA   . Hematuria, gross   . History of hiatal hernia   . HOH (hard of hearing)   . Kidney stone   . Prostate cancer ([place])   . Shortness of breath dyspnea     SURGICAL HISTORY: Past Surgical History:  Procedure Laterality Date  . [place]  . CYSTOSCOPY  06/24/2015   Procedure: CYSTOSCOPY;  Surgeon: Hollice Espy, MD;  Location: [facility] ORS;  Service: Urology;;  . HERNIA REPAIR     left and right inguinal hernia repair  . RADIOACTIVE SEED IMPLANT N/A 06/24/2015   Procedure: RADIOACTIVE SEED IMPLANT/BRACHYTHERAPY IMPLANT;  Surgeon: Hollice Espy, MD;  Location: [facility] ORS;  Service: Urology;  Laterality: N/A;    SOCIAL HISTORY: Social History   Socioeconomic History  . Marital status: Married    Spouse name: Not on file  . Number of children: 1  . Years of education: Not on file  . Highest education level: 8th grade  Occupational History  . 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 Food insecurity:    Worry: Never true    Inability: Never true  . Transportation needs:     Medical: No    Non-medical: No  Tobacco Use  . Smoking status: Former Smoker    Packs/day: 1.50    Types: Cigarettes    Last attempt to quit: 05/24/1981    Years since quitting: 36.9  . Smokeless tobacco: Current User    Types: Chew  Substance and Sexual Activity  . Alcohol use: Yes    Alcohol/week: 0.0 oz  . Drug use: No  . Sexual activity: Not on file  Lifestyle  . Physical activity:    Days per week: Not on file    Minutes per session: Not on file  . Stress: Only a little  Relationships  . Social connections:    Talks on phone: Not on file    Gets together: Not on file    Attends religious service: Not on file    Active member of club or organization: Not on file    Attends meetings of clubs or organizations: Not on file    Relationship status: Not on file  . Intimate partner violence:    Fear of current or ex partner: Not on file    Emotionally abused: Not on file    Physically abused: Not on file    Forced sexual activity: Not on file  Other Topics Concern  . Not on file  Social History Narrative  . Not on file    FAMILY HISTORY: Family History  Problem Relation Age of Onset  . Cancer Brother   . Heart disease Brother   . Diabetes Brother   . Emphysema Brother   . Prostate cancer Brother   . Diabetes Sister   . Emphysema Mother   . Healthy Sister   . Healthy Sister   . Healthy Sister   . Diabetes Brother   . Diabetes Brother   . Diabetes Brother   . Healthy Brother     ALLERGIES:  is allergic to mevacor [lovastatin]; niacin and related; statins; and aleve [naproxen sodium].  MEDICATIONS:  Current Outpatient Medications  Medication Sig Dispense Refill  . Cholecalciferol (VITAMIN D-3) 1000 UNITS CAPS Take 1 capsule by mouth daily.     . Coenzyme Q10 (COQ-10) 100 MG CAPS Take 100 mg by mouth daily. Reported on 02/06/2016    . Magnesium 400 MG TABS Take 1 tablet by mouth every other day.     . Red Yeast Rice 600 MG TABS Take 1 tablet by mouth at  bedtime.     Marland Kitchen albuterol (PROAIR HFA) 108 (90 BASE) MCG/ACT inhaler Reported on 05/24/2016    . fluticasone furoate-vilanterol (BREO ELLIPTA) 100-25 MCG/INH AEPB Inhale 1 puff into the lungs daily. (Patient not taking: Reported on 04/06/2018) 14 each 0   No current facility-administered medications for this visit.      PHYSICAL EXAMINATION: ECOG PERFORMANCE STATUS: 1 - Symptomatic but completely ambulatory Vitals:   05/10/18 1143  BP: 115/68  Pulse: 62  Resp: 18  Temp: 97.6 F (36.4 C)   Filed Weights   05/10/18 1143  Weight: 149 lb 9 oz (67.8 kg)    Physical Exam  Constitutional: He is oriented to person, place, and time. He appears well-developed and well-nourished. No distress.  HENT:  Head: Normocephalic and atraumatic.  Right Ear: External ear normal.  Left  Ear: External ear normal.  Mouth/Throat: Oropharynx is clear and moist.  Eyes: Pupils are equal, round, and reactive to light. Conjunctivae and EOM are normal. No scleral icterus.  Neck: Normal range of motion. Neck supple.  Cardiovascular: Normal rate, regular rhythm and normal heart sounds.  Pulmonary/Chest: Effort normal and breath sounds normal. No respiratory distress. He has no wheezes. He has no rales. He exhibits no tenderness.  Abdominal: Soft. Bowel sounds are normal. He exhibits no distension and no mass. There is no tenderness.  Musculoskeletal: Normal range of motion. He exhibits no edema or deformity.  Lymphadenopathy:    He has no cervical adenopathy.  Neurological: He is alert and oriented to person, place, and time. No cranial nerve deficit. Coordination normal.  Skin: Skin is warm and dry. No rash noted.  Psychiatric: He has a normal mood and affect. His behavior is normal. Thought content normal.     LABORATORY DATA:  I have reviewed the data as listed Lab Results  Component Value Date   WBC 5.8 05/10/2018   HGB 16.2 05/10/2018   HCT 48.1 05/10/2018   MCV 87.1 05/10/2018   PLT 267 05/10/2018    Recent Labs    05/25/17 0721 04/11/18 0749 05/10/18 1235  NA 143 147* 139  K 4.1 5.0 4.5  CL 102 103 102  CO2 31* 29 28  GLUCOSE 88 76 94  BUN 15 14 19   CREATININE 0.89 1.06 1.13  CALCIUM 9.6 9.4 9.6  GFRNONAA 82 66 60*  GFRAA 95 77 >60  PROT 6.3 6.2 7.5  ALBUMIN 4.1 4.1 4.2  AST 19 18 24   ALT 13 7 13*  ALKPHOS 79 80 77  BILITOT 0.4 0.4 0.6       ASSESSMENT & PLAN:  1. Prostate cancer [facility])    Discussed with patient that I will start with checking if his prostate cancer is castration sensitive or not.  Will check testosterone level.  Check bone scan.  Eventually will need CT scan to complete staging.  Will meet patient in 2 weeks to discuss results.   All questions were answered. The patient knows to call the clinic with any problems questions or concerns.  Return of visit: 2 weeks.  Thank you for this kind referral and the opportunity to participate in the care of this patient. A copy of today's note is routed to referring provider    Addendum: testosterone is out of castration range, 561. He received Leupron 30mg  on 05/10/2018.  Plan to add casodex to prevent flare. Patient's prostate cancer may still be castration sensitive.  Repeat PSA at next visit.    Briana Server, MD, PhD Hematology Oncology [facility] Pager- [phone] 05/10/2018

## 2018-05-11 LAB — TESTOSTERONE: Testosterone: 561 ng/dL (ref 264–916)

## 2018-05-11 MED ORDER — BICALUTAMIDE 50 MG PO TABS: 50.0000 mg | ORAL_TABLET | Freq: Every day | ORAL | 0 refills | Status: DC

## 2018-05-11 NOTE — Addendum Note (Signed)
Addended by: Quran Server on: 05/11/2018 09:22 PM   Modules accepted: Orders

## 2018-05-18 ENCOUNTER — Encounter: Payer: Self-pay | Admitting: Oncology

## 2018-05-18 ENCOUNTER — Encounter
Admission: RE | Admit: 2018-05-18 | Discharge: 2018-05-18 | Disposition: A | Discharge status: Home / Self Care | Payer: PPO | Source: Ambulatory Visit | Attending: Oncology | Admitting: Oncology

## 2018-05-18 DIAGNOSIS — C61 Malignant neoplasm of prostate: Secondary | ICD-10-CM | POA: Insufficient documentation

## 2018-05-18 DIAGNOSIS — Z7189 Other specified counseling: Secondary | ICD-10-CM | POA: Insufficient documentation

## 2018-05-18 MED ORDER — TECHNETIUM TC 99M MEDRONATE IV KIT
20.0000 | PACK | Freq: Once | INTRAVENOUS | Status: AC | PRN
  Administered 2018-05-18: 23.45 via INTRAVENOUS

## 2018-05-24 ENCOUNTER — Encounter: Payer: Self-pay | Admitting: Oncology

## 2018-05-24 ENCOUNTER — Inpatient Hospital Stay: Payer: PPO | Attending: Oncology | Admitting: Oncology

## 2018-05-24 ENCOUNTER — Other Ambulatory Visit: Payer: Self-pay

## 2018-05-24 VITALS — BP 123/60 | HR 64 | Temp 96.1°F | Resp 18 | Wt 149.7 lb

## 2018-05-24 DIAGNOSIS — C61 Malignant neoplasm of prostate: Secondary | ICD-10-CM | POA: Diagnosis not present

## 2018-05-24 DIAGNOSIS — Z87891 Personal history of nicotine dependence: Secondary | ICD-10-CM | POA: Insufficient documentation

## 2018-05-24 DIAGNOSIS — R5383 Other fatigue: Secondary | ICD-10-CM

## 2018-05-24 DIAGNOSIS — Z8042 Family history of malignant neoplasm of prostate: Secondary | ICD-10-CM | POA: Diagnosis not present

## 2018-05-24 DIAGNOSIS — R972 Elevated prostate specific antigen [PSA]: Secondary | ICD-10-CM

## 2018-05-24 NOTE — Progress Notes (Signed)
Hematology/Oncology Follow up note [facility] Telephone:([phone] Fax:(336) [phone]   Patient Care Team: Chrismon, Vickki Muff, PA as PCP - General (Physician Assistant) Garrel Ridgel, DPM as Consulting Physician (Podiatry) Noreene Filbert, MD as Referring Physician (Radiation Oncology) Idelle Leech, OD as Consulting Physician (Optometry)  REFERRING PROVIDER: Dr.Chrystal CHIEF COMPLAINTS/PURPOSE OF CONSULTATION:  Evaluation of prostate cancer  HISTORY OF PRESENTING ILLNESS:  Sean Wood. is a  82 y.o.  male with PMH listed below who was referred to me for evaluation of prostate cancer.   Cancer history dated back to 2009 when He was diagnosed with intermediate risk, Gleason 3+4, T1c prostate cancer in 2009. PSA at diagnosis was 6.1. IMRT was completed in August 2009. PSA nadir was 0.5. His PSA January 2013 was 0.8 and in January 2014 was 1.3. This was repeated in May 2014 and had increased to 1.8. He elected a repeat prostate biopsy in November 2014 after his PSA in October had increased to 2.2. This showed a focus of Gleason 3+3 adenocarcinoma from the right prostate involving less than 5%. He elected surveillance after discussing options. In May 2016, he was evaluated by Dr.Chrystal and he received salvage seed implantation with I-125. He subsequently follows with Dr.Chrystal for Lupron every 4 months since then. Achieve PSA nadir of 0.06 in 09/2016, and started to trend up and most recently trended up to 9.64.   INTERVAL HISTORY Sean Wood. is a 82 y.o. male who has above history reviewed by me today presents for follow up visit for management of prostate cancer.  During the interval he has lab test done and comes to discuss about results and future management plan. Problems listed as below.  # Prostate cancer with PSA elevated at 9.6: checked Testosterone was at 561 and, indicating possible still castration sensitive disease.  He has received  leupron 30mg . Called in Casodex to prevent flare. Patient reports taking Casodex and does not experience any symptoms of flare.  # Fatigue is chronic, stable.    Patient reports feeling tired, otherwise fine. He has good appetite. Denies bone pain.  Review of Systems  Constitutional: Positive for malaise/fatigue. Negative for chills, fever and weight loss.  HENT: Negative for congestion, ear discharge, ear pain, nosebleeds, sinus pain and sore throat.   Eyes: Negative for double vision, photophobia, pain, discharge and redness.  Respiratory: Negative for cough, hemoptysis, sputum production, shortness of breath and wheezing.   Cardiovascular: Negative for chest pain, palpitations, orthopnea, claudication and leg swelling.  Gastrointestinal: Negative for abdominal pain, blood in stool, constipation, diarrhea, heartburn, melena, nausea and vomiting.  Genitourinary: Negative for dysuria, flank pain, frequency and hematuria.  Musculoskeletal: Negative for back pain, myalgias and neck pain.  Skin: Negative for itching and rash.  Neurological: Negative for dizziness, tingling, tremors, focal weakness, weakness and headaches.  Endo/Heme/Allergies: Negative for environmental allergies. Does not bruise/bleed easily.  Psychiatric/Behavioral: Negative for depression and hallucinations. The patient is not nervous/anxious.     MEDICAL HISTORY:  Past Medical History:  Diagnosis Date  . Arthritis   . COPD (chronic obstructive pulmonary disease) ([place])   . Elevated PSA   . Hematuria, gross   . History of hiatal hernia   . HOH (hard of hearing)   . Kidney stone   . Prostate cancer ([place])   . Shortness of breath dyspnea     SURGICAL HISTORY: Past Surgical History:  Procedure Laterality Date  . [place]  . CYSTOSCOPY  06/24/2015   Procedure: CYSTOSCOPY;  Surgeon: Hollice Espy, MD;  Location: [facility] ORS;  Service: Urology;;  . HERNIA REPAIR     left and right inguinal hernia repair  .  RADIOACTIVE SEED IMPLANT N/A 06/24/2015   Procedure: RADIOACTIVE SEED IMPLANT/BRACHYTHERAPY IMPLANT;  Surgeon: Hollice Espy, MD;  Location: [facility] ORS;  Service: Urology;  Laterality: N/A;    SOCIAL HISTORY: Social History   Socioeconomic History  . Marital status: Married    Spouse name: Not on file  . Number of children: 1  . Years of education: Not on file  . Highest education level: 8th grade  Occupational History  . 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 Food insecurity:    Worry: Never true    Inability: Never true  . Transportation needs:    Medical: No    Non-medical: No  Tobacco Use  . Smoking status: Former Smoker    Packs/day: 1.50    Types: Cigarettes    Last attempt to quit: 05/24/1981    Years since quitting: 37.0  . Smokeless tobacco: Current User    Types: Chew  Substance and Sexual Activity  . Alcohol use: Yes    Alcohol/week: 0.0 oz  . Drug use: No  . Sexual activity: Not on file  Lifestyle  . Physical activity:    Days per week: Not on file    Minutes per session: Not on file  . Stress: Only a little  Relationships  . Social connections:    Talks on phone: Not on file    Gets together: Not on file    Attends religious service: Not on file    Active member of club or organization: Not on file    Attends meetings of clubs or organizations: Not on file    Relationship status: Not on file  . Intimate partner violence:    Fear of current or ex partner: Not on file    Emotionally abused: Not on file    Physically abused: Not on file    Forced sexual activity: Not on file  Other Topics Concern  . Not on file  Social History Narrative  . Not on file    FAMILY HISTORY: Family History  Problem Relation Age of Onset  . Cancer Brother   . Heart disease Brother   . Diabetes Brother   . Emphysema Brother   . Prostate cancer Brother   . Diabetes Sister   . Emphysema Mother   . Healthy Sister   . Healthy  Sister   . Healthy Sister   . Diabetes Brother   . Diabetes Brother   . Diabetes Brother   . Healthy Brother     ALLERGIES:  is allergic to mevacor [lovastatin]; niacin and related; statins; and aleve [naproxen sodium].  MEDICATIONS:  Current Outpatient Medications  Medication Sig Dispense Refill  . bicalutamide (CASODEX) 50 MG tablet Take 1 tablet (50 mg total) by mouth daily. 14 tablet 0  . Cholecalciferol (VITAMIN D-3) 1000 UNITS CAPS Take 1 capsule by mouth daily.     . Coenzyme Q10 (COQ-10) 100 MG CAPS Take 100 mg by mouth daily. Reported on 02/06/2016    . Magnesium 400 MG TABS Take 1 tablet by mouth every other day.     . Red Yeast Rice 600 MG TABS Take 1 tablet by mouth at bedtime.     Marland Kitchen albuterol (PROAIR HFA) 108 (90 BASE) MCG/ACT inhaler Reported on 05/24/2016    .  fluticasone furoate-vilanterol (BREO ELLIPTA) 100-25 MCG/INH AEPB Inhale 1 puff into the lungs daily. (Patient not taking: Reported on 04/06/2018) 14 each 0   No current facility-administered medications for this visit.      PHYSICAL EXAMINATION: ECOG PERFORMANCE STATUS: 1 - Symptomatic but completely ambulatory Vitals:   05/24/18 1344  BP: 123/60  Pulse: 64  Resp: 18  Temp: (!) 96.1 F (35.6 C)  SpO2: 98%   Filed Weights   05/24/18 1344  Weight: 149 lb 11.2 oz (67.9 kg)    Physical Exam  Constitutional: He is oriented to person, place, and time. He appears well-developed and well-nourished. No distress.  HENT:  Head: Normocephalic and atraumatic.  Right Ear: External ear normal.  Left Ear: External ear normal.  Mouth/Throat: Oropharynx is clear and moist.  Eyes: Pupils are equal, round, and reactive to light. Conjunctivae and EOM are normal. No scleral icterus.  Neck: Normal range of motion. Neck supple.  Cardiovascular: Normal rate, regular rhythm and normal heart sounds.  Pulmonary/Chest: Effort normal and breath sounds normal. No respiratory distress. He has no wheezes. He has no rales. He  exhibits no tenderness.  Abdominal: Soft. Bowel sounds are normal. He exhibits no distension and no mass. There is no tenderness.  Musculoskeletal: Normal range of motion. He exhibits no edema or deformity.  Lymphadenopathy:    He has no cervical adenopathy.  Neurological: He is alert and oriented to person, place, and time. No cranial nerve deficit. Coordination normal.  Skin: Skin is warm and dry. No rash noted.  Psychiatric: He has a normal mood and affect. His behavior is normal. Thought content normal.     LABORATORY DATA:  I have reviewed the data as listed Lab Results  Component Value Date   WBC 5.8 05/10/2018   HGB 16.2 05/10/2018   HCT 48.1 05/10/2018   MCV 87.1 05/10/2018   PLT 267 05/10/2018   Recent Labs    05/25/17 0721 04/11/18 0749 05/10/18 1235  NA 143 147* 139  K 4.1 5.0 4.5  CL 102 103 102  CO2 31* 29 28  GLUCOSE 88 76 94  BUN 15 14 19   CREATININE 0.89 1.06 1.13  CALCIUM 9.6 9.4 9.6  GFRNONAA 82 66 60*  GFRAA 95 77 >60  PROT 6.3 6.2 7.5  ALBUMIN 4.1 4.1 4.2  AST 19 18 24   ALT 13 7 13*  ALKPHOS 79 80 77  BILITOT 0.4 0.4 0.6       ASSESSMENT & PLAN:  1. Prostate cancer ([place])   2. PSA elevation   3. Other fatigue    # Prostate Cancer work up:   Bone scan was independently reviewed and discussed with patient. He has mild to moderate increased radiotracer activity in the right mid cervical likely DJD. Lab results were reviewed and discussed with patient. His testosterone is not within castration level. Likely still castration sensitive non metastatic disease.  S/p Leupron, added Casodex and he appears tolerates treatment. No signs of flare.  Advise patient to finish Casodex course.  Will repeat PSA and testosterone in 3 weeks.   # Fatigue: stable. Due to cancer and leupron. Continue monitor.   All questions were answered. The patient knows to call the clinic with any problems questions or concerns.  Return of visit: 3 weeks.    Sean Server,  MD, PhD Hematology Oncology [facility] Pager- [phone] 05/24/2018

## 2018-05-24 NOTE — Progress Notes (Signed)
Patient here for follow up. No concerns voiced.  °

## 2018-06-14 ENCOUNTER — Inpatient Hospital Stay: Payer: PPO

## 2018-06-14 ENCOUNTER — Encounter (INDEPENDENT_AMBULATORY_CARE_PROVIDER_SITE_OTHER): Payer: Self-pay

## 2018-06-14 DIAGNOSIS — C61 Malignant neoplasm of prostate: Secondary | ICD-10-CM

## 2018-06-14 LAB — PSA: Prostatic Specific Antigen: 1.69 ng/mL (ref 0.00–4.00)

## 2018-06-15 LAB — TESTOSTERONE: TESTOSTERONE: 6 ng/dL — AB (ref 264–916)

## 2018-06-19 ENCOUNTER — Other Ambulatory Visit: Payer: Self-pay

## 2018-06-19 ENCOUNTER — Encounter: Payer: Self-pay | Admitting: Oncology

## 2018-06-19 ENCOUNTER — Inpatient Hospital Stay: Payer: PPO | Attending: Oncology | Admitting: Oncology

## 2018-06-19 VITALS — BP 107/69 | HR 60 | Temp 96.3°F | Resp 18 | Wt 149.0 lb

## 2018-06-19 DIAGNOSIS — Z79818 Long term (current) use of other agents affecting estrogen receptors and estrogen levels: Secondary | ICD-10-CM | POA: Diagnosis not present

## 2018-06-19 DIAGNOSIS — C61 Malignant neoplasm of prostate: Secondary | ICD-10-CM | POA: Diagnosis not present

## 2018-06-19 DIAGNOSIS — R5382 Chronic fatigue, unspecified: Secondary | ICD-10-CM | POA: Diagnosis not present

## 2018-06-19 DIAGNOSIS — R5383 Other fatigue: Secondary | ICD-10-CM | POA: Insufficient documentation

## 2018-06-19 DIAGNOSIS — R232 Flushing: Secondary | ICD-10-CM | POA: Insufficient documentation

## 2018-06-19 DIAGNOSIS — N5089 Other specified disorders of the male genital organs: Secondary | ICD-10-CM | POA: Insufficient documentation

## 2018-06-19 NOTE — Progress Notes (Signed)
Hematology/Oncology Follow up note [facility] [phone] Fax:([phone]   Patient Care Team: Chrismon, Vickki Muff, PA as PCP - General (Physician Assistant) Garrel Ridgel, DPM as Consulting Physician (Podiatry) Noreene Filbert, MD as Referring Physician (Radiation Oncology) Idelle Leech, OD as Consulting Physician (Optometry) REASON FOR VISIT Follow up for treatment of biochemical recurrence of prostate cancer.  HISTORY OF PRESENTING ILLNESS:  Sean Wood. is a  82 y.o.  male with PMH listed below who was referred to me for evaluation of prostate cancer.   Cancer history dated back to 2009 when He was diagnosed with intermediate risk, Gleason 3+4, T1c prostate cancer in 2009. PSA at diagnosis was 6.1. IMRT was completed in August 2009. PSA nadir was 0.5. His PSA January 2013 was 0.8 and in January 2014 was 1.3. This was repeated in May 2014 and had increased to 1.8. He elected a repeat prostate biopsy in November 2014 after his PSA in October had increased to 2.2. This showed a focus of Gleason 3+3 adenocarcinoma from the right prostate involving less than 5%. He elected surveillance after discussing options. In May 2016, he was evaluated by Dr.Chrystal and he received salvage seed implantation with I-125. He subsequently follows with Dr.Chrystal for Lupron every 4 months since then. Achieve PSA nadir of 0.06 in 09/2016, and started to trend up and most recently trended up to 9.64.   INTERVAL HISTORY Sean Wood. is a 82 y.o. male who has above history reviewed by me today presents for follow up visit for management of biochemical recurrence of prostate cancer. Bone scan 05/18/2018 was negative for metastatic disease. Patient was restarted on Lupron 30 mg, given on 05/10/2018. Labs reviewed, PSA decreased from 9.6-1.69, testosterone at 6, within castration level.  He also completed a course of Casodex.  Did not experience any symptoms of  flare.  # Fatigue is chronic, reports energy level is worse, since his Lupron injection. #Hot flash, new problem, onset is after Lupron injection, intermittent, no aggravating factors or alleviating factors.   Review of Systems  Constitutional: Positive for malaise/fatigue. Negative for chills, fever and weight loss.  HENT: Negative for congestion, ear discharge, ear pain, nosebleeds, sinus pain and sore throat.   Eyes: Negative for double vision, photophobia, pain, discharge and redness.  Respiratory: Negative for cough, hemoptysis, sputum production, shortness of breath and wheezing.   Cardiovascular: Negative for chest pain, palpitations, orthopnea, claudication and leg swelling.  Gastrointestinal: Negative for abdominal pain, blood in stool, constipation, diarrhea, heartburn, melena, nausea and vomiting.  Genitourinary: Negative for dysuria, flank pain, frequency and hematuria.  Musculoskeletal: Negative for back pain, myalgias and neck pain.  Skin: Negative for itching and rash.  Neurological: Negative for dizziness, tingling, tremors, focal weakness, weakness and headaches.  Endo/Heme/Allergies: Negative for environmental allergies. Does not bruise/bleed easily.  Psychiatric/Behavioral: Negative for depression and hallucinations. The patient is not nervous/anxious.     MEDICAL HISTORY:  Past Medical History:  Diagnosis Date  . Arthritis   . COPD (chronic obstructive pulmonary disease) ([place])   . Elevated PSA   . Hematuria, gross   . History of hiatal hernia   . HOH (hard of hearing)   . Kidney stone   . Prostate cancer ([place])   . Shortness of breath dyspnea     SURGICAL HISTORY: Past Surgical History:  Procedure Laterality Date  . [place]  . CYSTOSCOPY  06/24/2015   Procedure: CYSTOSCOPY;  Surgeon: Hollice Espy,  MD;  Location: [facility] ORS;  Service: Urology;;  . HERNIA REPAIR     left and right inguinal hernia repair  . RADIOACTIVE SEED IMPLANT N/A 06/24/2015    Procedure: RADIOACTIVE SEED IMPLANT/BRACHYTHERAPY IMPLANT;  Surgeon: Hollice Espy, MD;  Location: [facility] ORS;  Service: Urology;  Laterality: N/A;    SOCIAL HISTORY: Social History   Socioeconomic History  . Marital status: Married    Spouse name: Not on file  . Number of children: 1  . Years of education: Not on file  . Highest education level: 8th grade  Occupational History  . 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 Food insecurity:    Worry: Never true    Inability: Never true  . Transportation needs:    Medical: No    Non-medical: No  Tobacco Use  . Smoking status: Former Smoker    Packs/day: 1.50    Types: Cigarettes    Last attempt to quit: 05/24/1981    Years since quitting: 37.0  . Smokeless tobacco: Current User    Types: Chew  Substance and Sexual Activity  . Alcohol use: Yes    Alcohol/week: 0.0 oz  . Drug use: No  . Sexual activity: Not on file  Lifestyle  . Physical activity:    Days per week: Not on file    Minutes per session: Not on file  . Stress: Only a little  Relationships  . Social connections:    Talks on phone: Not on file    Gets together: Not on file    Attends religious service: Not on file    Active member of club or organization: Not on file    Attends meetings of clubs or organizations: Not on file    Relationship status: Not on file  . Intimate partner violence:    Fear of current or ex partner: Not on file    Emotionally abused: Not on file    Physically abused: Not on file    Forced sexual activity: Not on file  Other Topics Concern  . Not on file  Social History Narrative  . Not on file    FAMILY HISTORY: Family History  Problem Relation Age of Onset  . Cancer Brother   . Heart disease Brother   . Diabetes Brother   . Emphysema Brother   . Prostate cancer Brother   . Diabetes Sister   . Emphysema Mother   . Healthy Sister   . Healthy Sister   . Healthy Sister   .  Diabetes Brother   . Diabetes Brother   . Diabetes Brother   . Healthy Brother     ALLERGIES:  is allergic to mevacor [lovastatin]; niacin and related; statins; and aleve [naproxen sodium].  MEDICATIONS:  Current Outpatient Medications  Medication Sig Dispense Refill  . albuterol (PROAIR HFA) 108 (90 BASE) MCG/ACT inhaler Reported on 05/24/2016    . Cholecalciferol (VITAMIN D-3) 1000 UNITS CAPS Take 1 capsule by mouth daily.     . Coenzyme Q10 (COQ-10) 100 MG CAPS Take 100 mg by mouth daily. Reported on 02/06/2016    . fluticasone furoate-vilanterol (BREO ELLIPTA) 100-25 MCG/INH AEPB Inhale 1 puff into the lungs daily. 14 each 0  . Magnesium 400 MG TABS Take 1 tablet by mouth every other day.     . Red Yeast Rice 600 MG TABS Take 1 tablet by mouth at bedtime.     . bicalutamide (CASODEX) 50 MG tablet  Take 1 tablet (50 mg total) by mouth daily. (Patient not taking: Reported on 06/19/2018) 14 tablet 0   No current facility-administered medications for this visit.      PHYSICAL EXAMINATION: ECOG PERFORMANCE STATUS: 1 - Symptomatic but completely ambulatory Vitals:   06/19/18 1010  BP: 107/69  Pulse: 60  Resp: 18  Temp: (!) 96.3 F (35.7 C)   Filed Weights   06/19/18 1010  Weight: 149 lb (67.6 kg)    Physical Exam  Constitutional: He is oriented to person, place, and time. He appears well-developed and well-nourished. No distress.  HENT:  Head: Normocephalic and atraumatic.  Right Ear: External ear normal.  Left Ear: External ear normal.  Mouth/Throat: Oropharynx is clear and moist.  Eyes: Pupils are equal, round, and reactive to light. Conjunctivae and EOM are normal. No scleral icterus.  Neck: Normal range of motion. Neck supple.  Cardiovascular: Normal rate, regular rhythm and normal heart sounds.  Pulmonary/Chest: Effort normal and breath sounds normal. No respiratory distress. He has no wheezes. He has no rales. He exhibits no tenderness.  Abdominal: Soft. Bowel sounds  are normal. He exhibits no distension and no mass. There is no tenderness.  Musculoskeletal: Normal range of motion. He exhibits no edema or deformity.  Lymphadenopathy:    He has no cervical adenopathy.  Neurological: He is alert and oriented to person, place, and time. No cranial nerve deficit. Coordination normal.  Skin: Skin is warm and dry. No rash noted.  Psychiatric: He has a normal mood and affect. His behavior is normal. Thought content normal.     LABORATORY DATA:  I have reviewed the data as listed Lab Results  Component Value Date   WBC 5.8 05/10/2018   HGB 16.2 05/10/2018   HCT 48.1 05/10/2018   MCV 87.1 05/10/2018   PLT 267 05/10/2018   Recent Labs    04/11/18 0749 05/10/18 1235  NA 147* 139  K 5.0 4.5  CL 103 102  CO2 29 28  GLUCOSE 76 94  BUN 14 19  CREATININE 1.06 1.13  CALCIUM 9.4 9.6  GFRNONAA 66 60*  GFRAA 77 >60  PROT 6.2 7.5  ALBUMIN 4.1 4.2  AST 18 24  ALT 7 13*  ALKPHOS 80 77  BILITOT 0.4 0.6       ASSESSMENT & PLAN:  1. Prostate cancer ([place])   2. Other fatigue   3. Androgen deprivation therapy    # Prostate Cancer with biochemical recurrence:   PSA decreased after androgen deprivation therapy.  Continue monitor.  # Fatigue/hot flash: Secondary to Lupron injection.  Discussed with patient.  Patient reports that hot flushes manageable not interested for taking medication. Orders Placed This Encounter  Procedures  . CBC with Differential/Platelet    Standing Status:   Future    Standing Expiration Date:   06/20/2019  . Comprehensive metabolic panel    Standing Status:   Future    Standing Expiration Date:   06/20/2019  . PSA    Standing Status:   Future    Standing Expiration Date:   06/20/2019    All questions were answered. The patient knows to call the clinic with any problems questions or concerns.  Return of visit: 3 weeks.    Belford Server, MD, PhD Hematology Oncology [facility] at [facility] Pager-  [phone] 06/19/2018

## 2018-06-19 NOTE — Progress Notes (Signed)
Patient here follow up. No concerns voiced.  

## 2018-07-17 ENCOUNTER — Inpatient Hospital Stay: Payer: PPO

## 2018-07-17 DIAGNOSIS — C61 Malignant neoplasm of prostate: Secondary | ICD-10-CM | POA: Diagnosis not present

## 2018-07-17 DIAGNOSIS — Z79818 Long term (current) use of other agents affecting estrogen receptors and estrogen levels: Secondary | ICD-10-CM

## 2018-07-17 LAB — COMPREHENSIVE METABOLIC PANEL
ALBUMIN: 4 g/dL (ref 3.5–5.0)
ALT: 14 U/L (ref 0–44)
AST: 21 U/L (ref 15–41)
Alkaline Phosphatase: 70 U/L (ref 38–126)
Anion gap: 10 (ref 5–15)
BUN: 23 mg/dL (ref 8–23)
CHLORIDE: 100 mmol/L (ref 98–111)
CO2: 31 mmol/L (ref 22–32)
Calcium: 9.7 mg/dL (ref 8.9–10.3)
Creatinine, Ser: 0.98 mg/dL (ref 0.61–1.24)
GFR calc Af Amer: >60 mL/min (ref >60)
Glucose, Bld: 86 mg/dL (ref 70–99)
POTASSIUM: 4.9 mmol/L (ref 3.5–5.1)
SODIUM: 141 mmol/L (ref 135–145)
Total Bilirubin: 0.5 mg/dL (ref 0.3–1.2)
Total Protein: 7 g/dL (ref 6.5–8.1)

## 2018-07-17 LAB — CBC WITH DIFFERENTIAL/PLATELET
Basophils Absolute: 0 10*3/uL (ref 0–0.1)
Basophils Relative: 1 %
EOS PCT: 2 %
Eosinophils Absolute: 0.1 10*3/uL (ref 0–0.7)
HCT: 45.9 % (ref 40.0–52.0)
Hemoglobin: 15.3 g/dL (ref 13.0–18.0)
LYMPHS ABS: 2 10*3/uL (ref 1.0–3.6)
LYMPHS PCT: 41 %
MCH: 29.3 pg (ref 26.0–34.0)
MCHC: 33.3 g/dL (ref 32.0–36.0)
MCV: 88 fL (ref 80.0–100.0)
Monocytes Absolute: 0.3 10*3/uL (ref 0.2–1.0)
Monocytes Relative: 7 %
Neutro Abs: 2.4 10*3/uL (ref 1.4–6.5)
Neutrophils Relative %: 49 %
PLATELETS: 254 10*3/uL (ref 150–440)
RBC: 5.22 MIL/uL (ref 4.40–5.90)
RDW: 13.8 % (ref 11.5–14.5)
WBC: 4.8 10*3/uL (ref 3.8–10.6)

## 2018-07-17 LAB — PSA: PROSTATIC SPECIFIC ANTIGEN: 1.14 ng/mL (ref 0.00–4.00)

## 2018-07-18 ENCOUNTER — Inpatient Hospital Stay (HOSPITAL_BASED_OUTPATIENT_CLINIC_OR_DEPARTMENT_OTHER): Payer: PPO | Admitting: Oncology

## 2018-07-18 ENCOUNTER — Other Ambulatory Visit: Payer: Self-pay

## 2018-07-18 ENCOUNTER — Encounter: Payer: Self-pay | Admitting: Oncology

## 2018-07-18 VITALS — BP 128/70 | HR 62 | Temp 96.9°F | Resp 18 | Wt 147.7 lb

## 2018-07-18 DIAGNOSIS — C61 Malignant neoplasm of prostate: Secondary | ICD-10-CM | POA: Diagnosis not present

## 2018-07-18 DIAGNOSIS — R5383 Other fatigue: Secondary | ICD-10-CM

## 2018-07-18 DIAGNOSIS — R232 Flushing: Secondary | ICD-10-CM

## 2018-07-18 MED ORDER — VENLAFAXINE HCL 37.5 MG PO TABS: 37.5000 mg | ORAL_TABLET | Freq: Every day | ORAL | 0 refills | Status: DC

## 2018-07-18 NOTE — Progress Notes (Signed)
Hematology/Oncology Follow up note [facility] Telephone:(336) [phone] Fax:(336) [phone]   Patient Care Team: Chrismon, Vickki Muff, PA as PCP - General (Physician Assistant) Garrel Ridgel, DPM as Consulting Physician (Podiatry) Noreene Filbert, MD as Referring Physician (Radiation Oncology) Idelle Leech, OD as Consulting Physician (Optometry) REASON FOR VISIT Follow up for treatment of biochemical recurrence of prostate cancer.  HISTORY OF PRESENTING ILLNESS:  Sean Cragg. is a  82 y.o.  male with PMH listed below who was referred to me for evaluation of prostate cancer.   Cancer history dated back to 2009 when He was diagnosed with intermediate risk, Gleason 3+4, T1c prostate cancer in 2009. PSA at diagnosis was 6.1. IMRT was completed in August 2009. PSA nadir was 0.5. His PSA January 2013 was 0.8 and in January 2014 was 1.3. This was repeated in May 2014 and had increased to 1.8. He elected a repeat prostate biopsy in November 2014 after his PSA in October had increased to 2.2. This showed a focus of Gleason 3+3 adenocarcinoma from the right prostate involving less than 5%. He elected surveillance after discussing options. In May 2016, he was evaluated by Dr.Chrystal and he received salvage seed implantation with I-125. He subsequently follows with Dr.Chrystal for Lupron every 4 months since then. Achieve PSA nadir of 0.06 in 09/2016, and started to trend up and most recently trended up to 9.64.   INTERVAL HISTORY Sean Wood. is a 82 y.o. male who has above history reviewed by me present for follow-up for management of biochemical recurrence of prostate cancer. He is status post Lupron 30 mg on 5/22/ 2019.  PSA decreased from 9-1.14, testosterone level at 6, with the castration level. #Continues to experience fatigue, onset is after Lupron injection. #Hot flush, intermittent, started after Lupron injection.  No aggravating factors or alleviating  factors. 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  Able to carry his ADLs and IADLs.      Review of Systems  Constitutional: Positive for malaise/fatigue. Negative for chills, fever and weight loss.  HENT: Negative for congestion, ear discharge, ear pain, nosebleeds, sinus pain and sore throat.   Eyes: Negative for double vision, photophobia, pain, discharge and redness.  Respiratory: Negative for cough, hemoptysis, sputum production, shortness of breath and wheezing.   Cardiovascular: Negative for chest pain, palpitations, orthopnea, claudication and leg swelling.  Gastrointestinal: Negative for abdominal pain, blood in stool, constipation, diarrhea, heartburn, melena, nausea and vomiting.  Genitourinary: Negative for dysuria, flank pain, frequency and hematuria.  Musculoskeletal: Negative for back pain, myalgias and neck pain.  Skin: Negative for itching and rash.       Hot flush  Neurological: Negative for dizziness, tingling, tremors, focal weakness, weakness and headaches.  Endo/Heme/Allergies: Negative for environmental allergies. Does not bruise/bleed easily.  Psychiatric/Behavioral: Negative for depression and hallucinations. The patient is not nervous/anxious.     MEDICAL HISTORY:  Past Medical History:  Diagnosis Date  . Arthritis   . COPD (chronic obstructive pulmonary disease) ([place])   . Elevated PSA   . Hematuria, gross   . History of hiatal hernia   . HOH (hard of hearing)   . Kidney stone   . Prostate cancer ([place])   . Shortness of breath dyspnea     SURGICAL HISTORY: Past Surgical History:  Procedure Laterality Date  . [place]  . CYSTOSCOPY  06/24/2015   Procedure: CYSTOSCOPY;  Surgeon: Hollice Espy, MD;  Location: [facility] ORS;  Service: Urology;;  . HERNIA REPAIR     left and right inguinal hernia repair  . RADIOACTIVE SEED IMPLANT N/A 06/24/2015   Procedure: RADIOACTIVE SEED IMPLANT/BRACHYTHERAPY IMPLANT;  Surgeon: Hollice Espy, MD;  Location: [facility] ORS;  Service: Urology;  Laterality: N/A;    SOCIAL HISTORY: Social History   Socioeconomic History  . Marital status: Married    Spouse name: Not on file  . Number of children: 1  . Years of education: Not on file  . Highest education level: 8th grade  Occupational History  . 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 Food insecurity:    Worry: Never true    Inability: Never true  . Transportation needs:    Medical: No    Non-medical: No  Tobacco Use  . Smoking status: Former Smoker    Packs/day: 1.50    Types: Cigarettes    Last attempt to quit: 05/24/1981    Years since quitting: 37.1  . Smokeless tobacco: Current User    Types: Chew  Substance and Sexual Activity  . Alcohol use: Yes    Alcohol/week: 0.0 oz  . Drug use: No  . Sexual activity: Not on file  Lifestyle  . Physical activity:    Days per week: Not on file    Minutes per session: Not on file  . Stress: Only a little  Relationships  . Social connections:    Talks on phone: Not on file    Gets together: Not on file    Attends religious service: Not on file    Active member of club or organization: Not on file    Attends meetings of clubs or organizations: Not on file    Relationship status: Not on file  . Intimate partner violence:    Fear of current or ex partner: Not on file    Emotionally abused: Not on file    Physically abused: Not on file    Forced sexual activity: Not on file  Other Topics Concern  . Not on file  Social History Narrative  . Not on file    FAMILY HISTORY: Family History  Problem Relation Age of Onset  . Cancer Brother   . Heart disease Brother   . Diabetes Brother   . Emphysema Brother   . Prostate cancer Brother   . Diabetes Sister   . Emphysema Mother   . Healthy Sister   . Healthy Sister   . Healthy Sister   . Diabetes Brother   . Diabetes Brother   . Diabetes Brother   . Healthy Brother       ALLERGIES:  is allergic to mevacor [lovastatin]; niacin and related; statins; and aleve [naproxen sodium].  MEDICATIONS:  Current Outpatient Medications  Medication Sig Dispense Refill  . Cholecalciferol (VITAMIN D-3) 1000 UNITS CAPS Take 1 capsule by mouth daily.     . Coenzyme Q10 (COQ-10) 100 MG CAPS Take 100 mg by mouth daily. Reported on 02/06/2016    . Magnesium 400 MG TABS Take 1 tablet by mouth every other day.     . Red Yeast Rice 600 MG TABS Take 1 tablet by mouth at bedtime.     Marland Kitchen albuterol (PROAIR HFA) 108 (90 BASE) MCG/ACT inhaler Reported on 05/24/2016    . bicalutamide (CASODEX) 50 MG tablet Take 1 tablet (50 mg total) by mouth daily. (Patient not taking: Reported on 06/19/2018) 14 tablet 0  . fluticasone furoate-vilanterol (BREO ELLIPTA)  100-25 MCG/INH AEPB Inhale 1 puff into the lungs daily. (Patient not taking: Reported on 07/18/2018) 14 each 0  . venlafaxine (EFFEXOR) 37.5 MG tablet Take 1 tablet (37.5 mg total) by mouth daily. 30 tablet 0   No current facility-administered medications for this visit.      PHYSICAL EXAMINATION: ECOG PERFORMANCE STATUS: 1 - Symptomatic but completely ambulatory Vitals:   07/18/18 1048  BP: 128/70  Pulse: 62  Resp: 18  Temp: (!) 96.9 F (36.1 C)   Filed Weights   07/18/18 1048  Weight: 147 lb 11.2 oz (67 kg)    Physical Exam  Constitutional: He is oriented to person, place, and time. He appears well-developed and well-nourished. No distress.  HENT:  Head: Normocephalic and atraumatic.  Right Ear: External ear normal.  Left Ear: External ear normal.  Mouth/Throat: Oropharynx is clear and moist.  Eyes: Pupils are equal, round, and reactive to light. Conjunctivae and EOM are normal. No scleral icterus.  Neck: Normal range of motion. Neck supple.  Cardiovascular: Normal rate, regular rhythm and normal heart sounds.  Pulmonary/Chest: Effort normal and breath sounds normal. No respiratory distress. He has no wheezes. He has no  rales. He exhibits no tenderness.  Abdominal: Soft. Bowel sounds are normal. He exhibits no distension and no mass. There is no tenderness.  Musculoskeletal: Normal range of motion. He exhibits no edema or deformity.  Lymphadenopathy:    He has no cervical adenopathy.  Neurological: He is alert and oriented to person, place, and time. No cranial nerve deficit. Coordination normal.  Skin: Skin is warm and dry. No rash noted. No erythema.  Psychiatric: He has a normal mood and affect. His behavior is normal. Thought content normal.     LABORATORY DATA:  I have reviewed the data as listed Lab Results  Component Value Date   WBC 4.8 07/17/2018   HGB 15.3 07/17/2018   HCT 45.9 07/17/2018   MCV 88.0 07/17/2018   PLT 254 07/17/2018   Recent Labs    04/11/18 0749 05/10/18 1235 07/17/18 0830  NA 147* 139 141  K 5.0 4.5 4.9  CL 103 102 100  CO2 29 28 31   GLUCOSE 76 94 86  BUN 14 19 23   CREATININE 1.06 1.13 0.98  CALCIUM 9.4 9.6 9.7  GFRNONAA 66 60* >60  GFRAA 77 >60 >60  PROT 6.2 7.5 7.0  ALBUMIN 4.1 4.2 4.0  AST 18 24 21   ALT 7 13* 14  ALKPHOS 80 77 70  BILITOT 0.4 0.6 0.5       ASSESSMENT & PLAN:  1. Prostate cancer [facility])    # Prostate Cancer with biochemical recurrence after radiation treatment: PSA continue to improve after androgen deprivation therapy.  Radical Proctectomy potentially is likely not a good option for him given his advance age, technically difficult after previous RT.  RP is associated with urinary side effects,  Check CT chest abd/pelvis.   # Fatigue/hot flash: due to ADT. Discussed with patient that I will start him on a trial of Effexor.   Orders Placed This Encounter  Procedures  . CT Chest W Contrast    Standing Status:   Future    Standing Expiration Date:   07/18/2019    Order Specific Question:   ** REASON FOR EXAM (FREE TEXT)    Answer:   prostate cancer, biochemical recurrence    Order Specific Question:   If indicated for the ordered  procedure, I authorize the administration of contrast media per Radiology  protocol    Answer:   Yes    Order Specific Question:   Preferred imaging location?    Answer:   [facility]    Order Specific Question:   Radiology Contrast Protocol - do NOT remove file path    Answer:   \\[email or web address]  . CT Abdomen Pelvis W Contrast    Standing Status:   Future    Standing Expiration Date:   07/18/2019    Order Specific Question:   ** REASON FOR EXAM (FREE TEXT)    Answer:   prostate cancer, biochemical recurrence    Order Specific Question:   If indicated for the ordered procedure, I authorize the administration of contrast media per Radiology protocol    Answer:   Yes    Order Specific Question:   Preferred imaging location?    Answer:   [facility]    Order Specific Question:   Is Oral Contrast requested for this exam?    Answer:   Yes, Per Radiology protocol    Order Specific Question:   Radiology Contrast Protocol - do NOT remove file path    Answer:   \\[email or web address]    All questions were answered. The patient knows to call the clinic with any problems questions or concerns.  Return of visit: 4 weeks  Danie Server, MD, PhD Hematology Oncology [facility] at [facility] Pager- [phone] 07/18/2018

## 2018-07-18 NOTE — Progress Notes (Signed)
Patient here for follow up

## 2018-08-01 ENCOUNTER — Ambulatory Visit
Admission: RE | Admit: 2018-08-01 | Discharge: 2018-08-01 | Disposition: A | Discharge status: Home / Self Care | Payer: PPO | Source: Ambulatory Visit | Attending: Oncology | Admitting: Oncology

## 2018-08-01 DIAGNOSIS — J439 Emphysema, unspecified: Secondary | ICD-10-CM | POA: Diagnosis not present

## 2018-08-01 DIAGNOSIS — C61 Malignant neoplasm of prostate: Secondary | ICD-10-CM | POA: Diagnosis not present

## 2018-08-01 DIAGNOSIS — J984 Other disorders of lung: Secondary | ICD-10-CM | POA: Diagnosis not present

## 2018-08-01 DIAGNOSIS — I2510 Atherosclerotic heart disease of native coronary artery without angina pectoris: Secondary | ICD-10-CM | POA: Insufficient documentation

## 2018-08-01 DIAGNOSIS — I700 Atherosclerosis of aorta: Secondary | ICD-10-CM | POA: Insufficient documentation

## 2018-08-01 DIAGNOSIS — K5730 Diverticulosis of large intestine without perforation or abscess without bleeding: Secondary | ICD-10-CM | POA: Diagnosis not present

## 2018-08-01 MED ORDER — IOHEXOL 300 MG/ML  SOLN
100.0000 mL | Freq: Once | INTRAMUSCULAR | Status: AC | PRN
  Administered 2018-08-01: 100 mL via INTRAVENOUS

## 2018-08-11 ENCOUNTER — Inpatient Hospital Stay: Payer: PPO | Attending: Oncology

## 2018-08-11 ENCOUNTER — Other Ambulatory Visit: Payer: Self-pay

## 2018-08-11 DIAGNOSIS — R232 Flushing: Secondary | ICD-10-CM | POA: Diagnosis not present

## 2018-08-11 DIAGNOSIS — C61 Malignant neoplasm of prostate: Secondary | ICD-10-CM | POA: Insufficient documentation

## 2018-08-11 DIAGNOSIS — R5382 Chronic fatigue, unspecified: Secondary | ICD-10-CM | POA: Insufficient documentation

## 2018-08-11 DIAGNOSIS — J439 Emphysema, unspecified: Secondary | ICD-10-CM | POA: Diagnosis not present

## 2018-08-11 LAB — COMPREHENSIVE METABOLIC PANEL
ALT: 12 U/L (ref 0–44)
AST: 22 U/L (ref 15–41)
Albumin: 3.9 g/dL (ref 3.5–5.0)
Alkaline Phosphatase: 66 U/L (ref 38–126)
Anion gap: 8 (ref 5–15)
BILIRUBIN TOTAL: 0.6 mg/dL (ref 0.3–1.2)
BUN: 17 mg/dL (ref 8–23)
CHLORIDE: 105 mmol/L (ref 98–111)
CO2: 31 mmol/L (ref 22–32)
Calcium: 9.4 mg/dL (ref 8.9–10.3)
Creatinine, Ser: 1.16 mg/dL (ref 0.61–1.24)
GFR calc Af Amer: >60 mL/min (ref >60)
GFR calc non Af Amer: 58 mL/min — ABNORMAL LOW (ref >60)
GLUCOSE: 105 mg/dL — AB (ref 70–99)
POTASSIUM: 4.1 mmol/L (ref 3.5–5.1)
Sodium: 144 mmol/L (ref 135–145)
TOTAL PROTEIN: 6.5 g/dL (ref 6.5–8.1)

## 2018-08-11 LAB — CBC WITH DIFFERENTIAL/PLATELET
Basophils Absolute: 0 10*3/uL (ref 0–0.1)
Basophils Relative: 1 %
EOS PCT: 2 %
Eosinophils Absolute: 0.1 10*3/uL (ref 0–0.7)
HCT: 42.2 % (ref 40.0–52.0)
Hemoglobin: 14.2 g/dL (ref 13.0–18.0)
LYMPHS ABS: 1.3 10*3/uL (ref 1.0–3.6)
LYMPHS PCT: 36 %
MCH: 29.4 pg (ref 26.0–34.0)
MCHC: 33.7 g/dL (ref 32.0–36.0)
MCV: 87.1 fL (ref 80.0–100.0)
MONO ABS: 0.3 10*3/uL (ref 0.2–1.0)
Monocytes Relative: 7 %
Neutro Abs: 2.1 10*3/uL (ref 1.4–6.5)
Neutrophils Relative %: 54 %
PLATELETS: 241 10*3/uL (ref 150–440)
RBC: 4.84 MIL/uL (ref 4.40–5.90)
RDW: 13.8 % (ref 11.5–14.5)
WBC: 3.8 10*3/uL (ref 3.8–10.6)

## 2018-08-11 LAB — PSA: Prostatic Specific Antigen: 0.86 ng/mL (ref 0.00–4.00)

## 2018-08-12 LAB — TESTOSTERONE: Testosterone: 3 ng/dL — ABNORMAL LOW (ref 264–916)

## 2018-08-14 ENCOUNTER — Encounter: Payer: Self-pay | Admitting: Oncology

## 2018-08-14 ENCOUNTER — Other Ambulatory Visit: Payer: Self-pay

## 2018-08-14 ENCOUNTER — Inpatient Hospital Stay (HOSPITAL_BASED_OUTPATIENT_CLINIC_OR_DEPARTMENT_OTHER): Payer: PPO | Admitting: Oncology

## 2018-08-14 VITALS — BP 108/66 | HR 56 | Temp 96.7°F | Resp 18 | Wt 148.5 lb

## 2018-08-14 DIAGNOSIS — R5382 Chronic fatigue, unspecified: Secondary | ICD-10-CM

## 2018-08-14 DIAGNOSIS — J439 Emphysema, unspecified: Secondary | ICD-10-CM

## 2018-08-14 DIAGNOSIS — C61 Malignant neoplasm of prostate: Secondary | ICD-10-CM | POA: Diagnosis not present

## 2018-08-14 DIAGNOSIS — R232 Flushing: Secondary | ICD-10-CM

## 2018-08-14 NOTE — Progress Notes (Signed)
Hematology/Oncology Follow up note [facility] Telephone:([phone]) [phone] Fax:([phone]) [phone]   Patient Care Team: Chrismon, Vickki Muff, PA as PCP - General (Physician Assistant) Garrel Ridgel, DPM as Consulting Physician (Podiatry) Noreene Filbert, MD as Referring Physician (Radiation Oncology) Idelle Leech, OD as Consulting Physician (Optometry) REASON FOR VISIT Follow up for treatment of biochemical recurrence of prostate cancer.  HISTORY OF PRESENTING ILLNESS:  Sean Dicioccio. is a  82 y.o.  male with PMH listed below who was referred to me for evaluation of prostate cancer.   Cancer history dated back to 2009 when He was diagnosed with intermediate risk, Gleason 3+4, T1c prostate cancer in 2009. PSA at diagnosis was 6.1. IMRT was completed in August 2009. PSA nadir was 0.5. His PSA January 2013 was 0.8 and in January 2014 was 1.3. This was repeated in May 2014 and had increased to 1.8. He elected a repeat prostate biopsy in November 2014 after his PSA in October had increased to 2.2. This showed a focus of Gleason 3+3 adenocarcinoma from the right prostate involving less than 5%. He elected surveillance after discussing options. In May 2016, he was evaluated by Dr.Chrystal and he received salvage seed implantation with I-125. He subsequently follows with Dr.Chrystal for Lupron every 4 months since then. Achieve PSA nadir of 0.06 in 09/2016, and started to trend up and most recently trended up to 9.64.   INTERVAL HISTORY Sean Wood. is a 82 y.o. male who has above history reviewed by me present for follow-up for management of biochemical recurrence of prostate cancer. He is status post Lupron 30 mg on 5/22/ 2019.  PSA improved .86.  testosterone level at 3, within castration level.   #Fatigue, chronic, onset was after Lupron injection.  Stable. #Hot flush, intermittent, started after Lupron injection.  No aggravating factors or elevating  factors.  Continues to experience fatigue, onset is after Lupron injection. #Hot flush, intermittent, started after Lupron injection.  No aggravating factors or alleviating factors.  He was given a trial of Effexor, patient reports not taking. 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  Able to carry his ADLs and IADLs.      Review of Systems  Constitutional: Positive for malaise/fatigue. Negative for chills, fever and weight loss.  HENT: Negative for congestion, ear discharge, ear pain, nosebleeds, sinus pain and sore throat.   Eyes: Negative for double vision, photophobia, pain, discharge and redness.  Respiratory: Negative for cough, hemoptysis, sputum production, shortness of breath and wheezing.   Cardiovascular: Negative for chest pain, palpitations, orthopnea, claudication and leg swelling.  Gastrointestinal: Negative for abdominal pain, blood in stool, constipation, diarrhea, heartburn, melena, nausea and vomiting.  Genitourinary: Negative for dysuria, flank pain, frequency and hematuria.  Musculoskeletal: Negative for back pain, myalgias and neck pain.  Skin: Negative for itching and rash.       Hot flush  Neurological: Negative for dizziness, tingling, tremors, focal weakness, weakness and headaches.  Endo/Heme/Allergies: Negative for environmental allergies. Does not bruise/bleed easily.  Psychiatric/Behavioral: Negative for depression and hallucinations. The patient is not nervous/anxious.     MEDICAL HISTORY:  Past Medical History:  Diagnosis Date  . Arthritis   . COPD (chronic obstructive pulmonary disease) ([place])   . Elevated PSA   . Hematuria, gross   . History of hiatal hernia   . HOH (hard of hearing)   . Kidney stone   . Prostate cancer ([place])   . Shortness of breath dyspnea  SURGICAL HISTORY: Past Surgical History:  Procedure Laterality Date  . [place]  . CYSTOSCOPY  06/24/2015   Procedure: CYSTOSCOPY;  Surgeon: Hollice Espy, MD;  Location: [facility] ORS;  Service: Urology;;  . HERNIA REPAIR     left and right inguinal hernia repair  . RADIOACTIVE SEED IMPLANT N/A 06/24/2015   Procedure: RADIOACTIVE SEED IMPLANT/BRACHYTHERAPY IMPLANT;  Surgeon: Hollice Espy, MD;  Location: [facility] ORS;  Service: Urology;  Laterality: N/A;    SOCIAL HISTORY: Social History   Socioeconomic History  . Marital status: Married    Spouse name: Not on file  . Number of children: 1  . Years of education: Not on file  . Highest education level: 8th grade  Occupational History  . 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 Food insecurity:    Worry: Never true    Inability: Never true  . Transportation needs:    Medical: No    Non-medical: No  Tobacco Use  . Smoking status: Former Smoker    Packs/day: 1.50    Types: Cigarettes    Last attempt to quit: 05/24/1981    Years since quitting: 37.2  . Smokeless tobacco: Current User    Types: Chew  Substance and Sexual Activity  . Alcohol use: Yes    Alcohol/week: 0.0 standard drinks  . Drug use: No  . Sexual activity: Not on file  Lifestyle  . Physical activity:    Days per week: Not on file    Minutes per session: Not on file  . Stress: Only a little  Relationships  . Social connections:    Talks on phone: Not on file    Gets together: Not on file    Attends religious service: Not on file    Active member of club or organization: Not on file    Attends meetings of clubs or organizations: Not on file    Relationship status: Not on file  . Intimate partner violence:    Fear of current or ex partner: Not on file    Emotionally abused: Not on file    Physically abused: Not on file    Forced sexual activity: Not on file  Other Topics Concern  . Not on file  Social History Narrative  . Not on file    FAMILY HISTORY: Family History  Problem Relation Age of Onset  . Cancer Brother   . Heart disease Brother   . Diabetes  Brother   . Emphysema Brother   . Prostate cancer Brother   . Diabetes Sister   . Emphysema Mother   . Healthy Sister   . Healthy Sister   . Healthy Sister   . Diabetes Brother   . Diabetes Brother   . Diabetes Brother   . Healthy Brother     ALLERGIES:  is allergic to mevacor [lovastatin]; niacin and related; statins; and aleve [naproxen sodium].  MEDICATIONS:  Current Outpatient Medications  Medication Sig Dispense Refill  . Cholecalciferol (VITAMIN D-3) 1000 UNITS CAPS Take 1 capsule by mouth daily.     . Coenzyme Q10 (COQ-10) 100 MG CAPS Take 100 mg by mouth daily. Reported on 02/06/2016    . Magnesium 400 MG TABS Take 1 tablet by mouth every other day.     . Red Yeast Rice 600 MG TABS Take 1 tablet by mouth at bedtime.     Marland Kitchen albuterol (PROAIR HFA) 108 (90 BASE) MCG/ACT inhaler Reported  on 05/24/2016    . bicalutamide (CASODEX) 50 MG tablet Take 1 tablet (50 mg total) by mouth daily. (Patient not taking: Reported on 06/19/2018) 14 tablet 0  . fluticasone furoate-vilanterol (BREO ELLIPTA) 100-25 MCG/INH AEPB Inhale 1 puff into the lungs daily. (Patient not taking: Reported on 07/18/2018) 14 each 0  . venlafaxine (EFFEXOR) 37.5 MG tablet Take 1 tablet (37.5 mg total) by mouth daily. (Patient not taking: Reported on 08/14/2018) 30 tablet 0   No current facility-administered medications for this visit.      PHYSICAL EXAMINATION: ECOG PERFORMANCE STATUS: 1 - Symptomatic but completely ambulatory Vitals:   08/14/18 1021  BP: 108/66  Pulse: (!) 56  Resp: 18  Temp: (!) 96.7 F (35.9 C)   Filed Weights   08/14/18 1021  Weight: 148 lb 8 oz (67.4 kg)    Physical Exam  Constitutional: He is oriented to person, place, and time. He appears well-developed and well-nourished. No distress.  HENT:  Head: Normocephalic and atraumatic.  Right Ear: External ear normal.  Left Ear: External ear normal.  Mouth/Throat: Oropharynx is clear and moist.  Eyes: Pupils are equal, round, and  reactive to light. Conjunctivae and EOM are normal. No scleral icterus.  Neck: Normal range of motion. Neck supple.  Cardiovascular: Normal rate, regular rhythm and normal heart sounds.  Pulmonary/Chest: Effort normal. No respiratory distress. He has no wheezes. He has no rales. He exhibits no tenderness.  Abdominal: Soft. Bowel sounds are normal. He exhibits no distension and no mass. There is no tenderness.  Musculoskeletal: Normal range of motion. He exhibits no edema or deformity.  Lymphadenopathy:    He has no cervical adenopathy.  Neurological: He is alert and oriented to person, place, and time. No cranial nerve deficit. Coordination normal.  Skin: Skin is warm and dry. No rash noted. No erythema.  Psychiatric: He has a normal mood and affect. His behavior is normal. Thought content normal.     LABORATORY DATA:  I have reviewed the data as listed Lab Results  Component Value Date   WBC 3.8 08/11/2018   HGB 14.2 08/11/2018   HCT 42.2 08/11/2018   MCV 87.1 08/11/2018   PLT 241 08/11/2018   Recent Labs    05/10/18 1235 07/17/18 0830 08/11/18 0812  NA 139 141 144  K 4.5 4.9 4.1  CL 102 100 105  CO2 28 31 31   GLUCOSE 94 86 105*  BUN 19 23 17   CREATININE 1.13 0.98 1.16  CALCIUM 9.6 9.7 9.4  GFRNONAA 60* >60 58*  GFRAA >60 >60 >60  PROT 7.5 7.0 6.5  ALBUMIN 4.2 4.0 3.9  AST 24 21 22   ALT 13* 14 12  ALKPHOS 77 70 66  BILITOT 0.6 0.5 0.6   RADIOGRAPHIC STUDIES: I have personally reviewed the radiological images as listed and agreed with the findings in the report. 05/18/2018 bone scan negative for metastatic disease 08/01/2018 CT chest abdomen pelvis showed no evidence of metastatic disease.  Brachytherapy seeds in the prostate gland no findings of local regional adenopathy or metastatic disease.  Chronic findings of advanced atherosclerotic calcifications of thoracic and abdominal aorta and branch vessels.  Including 3 vessel coronary artery  calcification.   ASSESSMENT & PLAN:  1. CA of prostate ([place])    # Prostate Cancer with biochemical recurrence after radiation treatment: Castration sensitive.  PSA continue to improve after start of ADT.  Patient is due for Lupron in September  # Will touch base with patient's urologist to see  if surgery is an option.   # Fatigue/hot flash: Due to ADT. Patient was prescribed with Effexor and he is not taking.   Orders Placed This Encounter  Procedures  . CBC with Differential/Platelet    Standing Status:   Future    Standing Expiration Date:   08/15/2019  . Comprehensive metabolic panel    Standing Status:   Future    Standing Expiration Date:   08/15/2019  . PSA    Standing Status:   Future    Standing Expiration Date:   08/15/2019    All questions were answered. The patient knows to call the clinic with any problems questions or concerns.  Return of visit: around 9/20 for Lupron injection.   Sean Server, MD, PhD Hematology Oncology [facility] Pager- [phone] 08/14/2018

## 2018-08-14 NOTE — Progress Notes (Signed)
Patient here for follow up. No concerns voiced.  °

## 2018-08-15 ENCOUNTER — Ambulatory Visit: Payer: PPO | Admitting: Family Medicine

## 2018-08-29 ENCOUNTER — Ambulatory Visit (INDEPENDENT_AMBULATORY_CARE_PROVIDER_SITE_OTHER): Payer: PPO | Admitting: Family Medicine

## 2018-08-29 ENCOUNTER — Encounter: Payer: Self-pay | Admitting: Family Medicine

## 2018-08-29 VITALS — BP 118/66 | HR 64 | Temp 98.5°F | Wt 151.0 lb

## 2018-08-29 DIAGNOSIS — E7800 Pure hypercholesterolemia, unspecified: Secondary | ICD-10-CM

## 2018-08-29 DIAGNOSIS — C61 Malignant neoplasm of prostate: Secondary | ICD-10-CM | POA: Diagnosis not present

## 2018-08-29 DIAGNOSIS — Z23 Encounter for immunization: Secondary | ICD-10-CM

## 2018-08-29 DIAGNOSIS — J439 Emphysema, unspecified: Secondary | ICD-10-CM

## 2018-08-29 NOTE — Progress Notes (Signed)
Patient: Sean Wood. Male    DOB: 05-16-1936   82 y.o.   MRN: [id number] Visit Date: 08/29/2018  Today's Provider: [name], PA   Chief Complaint  Patient presents with  . Hyperlipidemia  . COPD   Subjective:    Hyperlipidemia  This is a chronic problem. Recent lipid tests were reviewed and are high (Cholesteral slightly elevated at last check.  Pt advised to increase Red Yeast Rice to 2 daily and add Co Q 10.). Pertinent negatives include no chest pain, focal sensory loss, focal weakness, leg pain, myalgias or shortness of breath. Current antihyperlipidemic treatment includes herbal therapy. There are no compliance problems.   COPD  There is no shortness of breath. This is a chronic problem. The problem has been unchanged. Pertinent negatives include no appetite change, chest pain, fever, headaches or myalgias. His past medical history is significant for COPD.   Lab Results  Component Value Date   CHOL 214 (H) 04/11/2018   CHOL 197 05/25/2017   CHOL 183 12/27/2016   Lab Results  Component Value Date   HDL 55 04/11/2018   HDL 67 05/25/2017   HDL 66 12/27/2016   Lab Results  Component Value Date   LDLCALC 142 (H) 04/11/2018   LDLCALC 117 (H) 05/25/2017   LDLCALC 102 (H) 12/27/2016   Lab Results  Component Value Date   TRIG 87 04/11/2018   TRIG 65 05/25/2017   TRIG 73 12/27/2016   Lab Results  Component Value Date   CHOLHDL 3.9 04/11/2018   CHOLHDL 2.9 05/25/2017   CHOLHDL 2.8 12/27/2016   No results found for: LDLDIRECT Wt Readings from Last 3 Encounters:  08/29/18 151 lb (68.5 kg)  08/14/18 148 lb 8 oz (67.4 kg)  07/18/18 147 lb 11.2 oz (67 kg)      Past Medical History:  Diagnosis Date  . Arthritis   . COPD (chronic obstructive pulmonary disease) ([place])   . Elevated PSA   . Hematuria, gross   . History of hiatal hernia   . HOH (hard of hearing)   . Kidney stone   . Prostate cancer ([place])   . Shortness of breath dyspnea    Past  Surgical History:  Procedure Laterality Date  . [place]  . CYSTOSCOPY  06/24/2015   Procedure: CYSTOSCOPY;  Surgeon: Hollice Espy, MD;  Location: [facility] ORS;  Service: Urology;;  . HERNIA REPAIR     left and right inguinal hernia repair  . RADIOACTIVE SEED IMPLANT N/A 06/24/2015   Procedure: RADIOACTIVE SEED IMPLANT/BRACHYTHERAPY IMPLANT;  Surgeon: Hollice Espy, MD;  Location: [facility] ORS;  Service: Urology;  Laterality: N/A;   Family History  Problem Relation Age of Onset  . Cancer Brother   . Heart disease Brother   . Diabetes Brother   . Emphysema Brother   . Prostate cancer Brother   . Diabetes Sister   . Emphysema Mother   . Healthy Sister   . Healthy Sister   . Healthy Sister   . Diabetes Brother   . Diabetes Brother   . Diabetes Brother   . Healthy Brother    Allergies  Allergen Reactions  . Mevacor [Lovastatin] Other (See Comments)    "feel washed out"  . Niacin And Related Other (See Comments)    "hot feeling"  . Statins Other (See Comments)    "feel washed out"  . Aleve [Naproxen Sodium] Rash    Current Outpatient Medications:  .  Cholecalciferol (VITAMIN D-3) 1000 UNITS CAPS, Take 1 capsule by mouth daily. , Disp: , Rfl:  .  Coenzyme Q10 (COQ-10) 100 MG CAPS, Take 100 mg by mouth daily. Reported on 02/06/2016, Disp: , Rfl:  .  Magnesium 400 MG TABS, Take 1 tablet by mouth every other day. , Disp: , Rfl:  .  Red Yeast Rice 600 MG TABS, Take 1 tablet by mouth at bedtime. , Disp: , Rfl:  .  albuterol (PROAIR HFA) 108 (90 BASE) MCG/ACT inhaler, Reported on 05/24/2016, Disp: , Rfl:  .  fluticasone furoate-vilanterol (BREO ELLIPTA) 100-25 MCG/INH AEPB, Inhale 1 puff into the lungs daily. (Patient not taking: Reported on 08/29/2018), Disp: 14 each, Rfl: 0 .  venlafaxine (EFFEXOR) 37.5 MG tablet, Take 1 tablet (37.5 mg total) by mouth daily. (Patient not taking: Reported on 08/14/2018), Disp: 30 tablet, Rfl: 0  Review of Systems  Constitutional: Positive for  fatigue. Negative for activity change, appetite change, chills, diaphoresis, fever and unexpected weight change.  Respiratory: Negative.  Negative for shortness of breath.   Cardiovascular: Negative for chest pain.  Gastrointestinal: Negative.   Endocrine: Positive for heat intolerance (Pt is having trouble with hot flashes.).  Musculoskeletal: Negative for myalgias.  Neurological: Negative for dizziness, focal weakness, light-headedness and headaches.   Social History   Tobacco Use  . Smoking status: Former Smoker    Packs/day: 1.50    Types: Cigarettes    Last attempt to quit: 05/24/1981    Years since quitting: 37.2  . Smokeless tobacco: Current User    Types: Chew  Substance Use Topics  . Alcohol use: Yes    Alcohol/week: 0.0 standard drinks   Objective:   BP 118/66 (BP Location: Right Arm, Patient Position: Sitting, Cuff Size: Normal)   Pulse 64   Temp 98.5 F (36.9 C) (Oral)   Wt 151 lb (68.5 kg)   SpO2 96%   BMI 22.96 kg/m  Vitals:   08/29/18 0921  BP: 118/66  Pulse: 64  Temp: 98.5 F (36.9 C)  TempSrc: Oral  SpO2: 96%  Weight: 151 lb (68.5 kg)   Physical Exam  Constitutional: He is oriented to person, place, and time. He appears well-developed and well-nourished. No distress.  HENT:  Head: Normocephalic and atraumatic.  Right Ear: Hearing normal.  Left Ear: Hearing normal.  Nose: Nose normal.  Eyes: Conjunctivae and lids are normal. Right eye exhibits no discharge. Left eye exhibits no discharge. No scleral icterus.  Cardiovascular: Normal rate and regular rhythm.  Pulmonary/Chest: Effort normal. No respiratory distress.  Distant breath sounds. No wheeze, rales or rhonchi.  Musculoskeletal: Normal range of motion.  Neurological: He is alert and oriented to person, place, and time.  Skin: Skin is intact. No lesion and no rash noted.  Psychiatric: He has a normal mood and affect. His speech is normal and behavior is normal. Thought content normal.        Assessment & Plan:     1. Hypercholesteremia Feeling well and continues to take the Co-Q 10 100 mg qd with Red Yeast Rice 600 mg HS and follow ing low fat diet. 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 Will get recheck of CBC, CMP and PSA by Dr. Miachel Roux (oncologist) for follow up of prostate cancer on 09-11-18. May recheck fasting Lipid Panel the same day. Recheck pending reports. - Lipid panel  2. Pulmonary emphysema, unspecified emphysema type (HCC) No dyspnea, cough or congestion. No longer feels he needs the Albuterol  or Breo inhalers. No longer smoking (stopped 05-24-81). Recheck prn.  3. CA of prostate ([place]) Continues to follow up with oncologist and get Lupron injections every 3-4 months. Next injection around 09-11-18. Tolerates hot flashes and decided to not take the Effexor.  4. Needs flu shot -Flu vaccine HIGH DOSE PF (Fluzone High dose)       [name], PA  [facility]

## 2018-09-11 ENCOUNTER — Inpatient Hospital Stay: Payer: PPO | Attending: Oncology

## 2018-09-11 ENCOUNTER — Inpatient Hospital Stay: Payer: PPO

## 2018-09-11 ENCOUNTER — Inpatient Hospital Stay (HOSPITAL_BASED_OUTPATIENT_CLINIC_OR_DEPARTMENT_OTHER): Payer: PPO | Admitting: Oncology

## 2018-09-11 ENCOUNTER — Encounter: Payer: Self-pay | Admitting: Oncology

## 2018-09-11 ENCOUNTER — Other Ambulatory Visit: Payer: Self-pay

## 2018-09-11 VITALS — BP 121/63 | HR 56 | Temp 97.4°F | Resp 18 | Wt 147.8 lb

## 2018-09-11 DIAGNOSIS — Z87891 Personal history of nicotine dependence: Secondary | ICD-10-CM | POA: Insufficient documentation

## 2018-09-11 DIAGNOSIS — Z79818 Long term (current) use of other agents affecting estrogen receptors and estrogen levels: Secondary | ICD-10-CM

## 2018-09-11 DIAGNOSIS — J439 Emphysema, unspecified: Secondary | ICD-10-CM | POA: Insufficient documentation

## 2018-09-11 DIAGNOSIS — R232 Flushing: Secondary | ICD-10-CM

## 2018-09-11 DIAGNOSIS — C61 Malignant neoplasm of prostate: Secondary | ICD-10-CM

## 2018-09-11 DIAGNOSIS — R5383 Other fatigue: Secondary | ICD-10-CM | POA: Diagnosis not present

## 2018-09-11 DIAGNOSIS — N951 Menopausal and female climacteric states: Secondary | ICD-10-CM

## 2018-09-11 DIAGNOSIS — E291 Testicular hypofunction: Secondary | ICD-10-CM | POA: Diagnosis not present

## 2018-09-11 LAB — CBC WITH DIFFERENTIAL/PLATELET
Basophils Absolute: 0.1 10*3/uL (ref 0–0.1)
Basophils Relative: 1 %
Eosinophils Absolute: 0.1 10*3/uL (ref 0–0.7)
Eosinophils Relative: 2 %
HCT: 42.6 % (ref 40.0–52.0)
HEMOGLOBIN: 14.4 g/dL (ref 13.0–18.0)
LYMPHS PCT: 38 %
Lymphs Abs: 1.8 10*3/uL (ref 1.0–3.6)
MCH: 29.9 pg (ref 26.0–34.0)
MCHC: 33.8 g/dL (ref 32.0–36.0)
MCV: 88.3 fL (ref 80.0–100.0)
MONO ABS: 0.3 10*3/uL (ref 0.2–1.0)
MONOS PCT: 6 %
NEUTROS ABS: 2.5 10*3/uL (ref 1.4–6.5)
NEUTROS PCT: 53 %
Platelets: 258 10*3/uL (ref 150–440)
RBC: 4.82 MIL/uL (ref 4.40–5.90)
RDW: 14.3 % (ref 11.5–14.5)
WBC: 4.6 10*3/uL (ref 3.8–10.6)

## 2018-09-11 LAB — COMPREHENSIVE METABOLIC PANEL
ALBUMIN: 3.9 g/dL (ref 3.5–5.0)
ALT: 13 U/L (ref 0–44)
AST: 20 U/L (ref 15–41)
Alkaline Phosphatase: 68 U/L (ref 38–126)
Anion gap: 8 (ref 5–15)
BUN: 17 mg/dL (ref 8–23)
CHLORIDE: 104 mmol/L (ref 98–111)
CO2: 30 mmol/L (ref 22–32)
Calcium: 9.4 mg/dL (ref 8.9–10.3)
Creatinine, Ser: 0.84 mg/dL (ref 0.61–1.24)
GFR calc Af Amer: >60 mL/min (ref >60)
GFR calc non Af Amer: >60 mL/min (ref >60)
GLUCOSE: 102 mg/dL — AB (ref 70–99)
POTASSIUM: 4.8 mmol/L (ref 3.5–5.1)
SODIUM: 142 mmol/L (ref 135–145)
Total Bilirubin: 0.6 mg/dL (ref 0.3–1.2)
Total Protein: 6.6 g/dL (ref 6.5–8.1)

## 2018-09-11 LAB — PSA: PROSTATIC SPECIFIC ANTIGEN: 0.68 ng/mL (ref 0.00–4.00)

## 2018-09-11 NOTE — Progress Notes (Signed)
Hematology/Oncology Follow up note [facility] Telephone:([phone] Fax:([phone]) [phone]   Patient Care Team: Chrismon, Vickki Muff, PA as PCP - General (Physician Assistant) Garrel Ridgel, DPM as Consulting Physician (Podiatry) Noreene Filbert, MD as Referring Physician (Radiation Oncology) Idelle Leech, OD as Consulting Physician (Optometry) REASON FOR VISIT Follow up for treatment of biochemical recurrence of prostate cancer.  HISTORY OF PRESENTING ILLNESS:  Sean Wood. is a  82 y.o.  male with PMH listed below who was referred to me for evaluation of prostate cancer.   Cancer history dated back to 2009 when He was diagnosed with intermediate risk, Gleason 3+4, T1c prostate cancer in 2009. PSA at diagnosis was 6.1. IMRT was completed in August 2009. PSA nadir was 0.5. His PSA January 2013 was 0.8 and in January 2014 was 1.3. This was repeated in May 2014 and had increased to 1.8. He elected a repeat prostate biopsy in November 2014 after his PSA in October had increased to 2.2. This showed a focus of Gleason 3+3 adenocarcinoma from the right prostate involving less than 5%. He elected surveillance after discussing options. In May 2016, he was evaluated by Dr.Chrystal and he received salvage seed implantation with I-125. He subsequently follows with Dr.Chrystal for Lupron every 4 months since then. Achieve PSA nadir of 0.06 in 09/2016, and started to trend up and most recently trended up to 9.64.   INTERVAL HISTORY Sean Wood. is a 82 y.o. male who has above history reviewed by me present for follow-up for management of biochemical recurrence of prostate cancer. He is status post Lupron 30 mg on 05/10/2018.  Testosterone level was at 3, within castration level.  PSA has decreased 0.86  #Patient has also had CT scan and bone scan done which did not show obvious metastatic disease. Today he denies any bone pain.  # He reports having ongoing hot flush,  intermittent.  Hot flush started after Lupron injection.  No aggravating factors or elevating factors.  He was prescribed with Effexor which he does not want to take.  Fatigue is at baseline.  #Chronic shortness of breath with exertion due to COPD emphysema.  Stable.  Able to carry his ADLs and IADLs.      Review of Systems  Constitutional: Positive for malaise/fatigue. Negative for chills, fever and weight loss.  HENT: Negative for congestion, ear discharge, ear pain, nosebleeds, sinus pain and sore throat.   Eyes: Negative for double vision, photophobia, pain, discharge and redness.  Respiratory: Negative for cough, hemoptysis, sputum production, shortness of breath and wheezing.   Cardiovascular: Negative for chest pain, palpitations, orthopnea, claudication and leg swelling.  Gastrointestinal: Negative for abdominal pain, blood in stool, constipation, diarrhea, heartburn, melena, nausea and vomiting.  Genitourinary: Negative for dysuria, flank pain, frequency and hematuria.  Musculoskeletal: Negative for back pain, myalgias and neck pain.  Skin: Negative for itching and rash.       Hot flush  Neurological: Negative for dizziness, tingling, tremors, focal weakness, weakness and headaches.  Endo/Heme/Allergies: Negative for environmental allergies. Does not bruise/bleed easily.  Psychiatric/Behavioral: Negative for depression and hallucinations. The patient is not nervous/anxious.     MEDICAL HISTORY:  Past Medical History:  Diagnosis Date  . Arthritis   . COPD (chronic obstructive pulmonary disease) ([place])   . Elevated PSA   . Hematuria, gross   . History of hiatal hernia   . HOH (hard of hearing)   . Kidney stone   . Prostate cancer (  [place])   . Shortness of breath dyspnea     SURGICAL HISTORY: Past Surgical History:  Procedure Laterality Date  . [place]  . CYSTOSCOPY  06/24/2015   Procedure: CYSTOSCOPY;  Surgeon: Hollice Espy, MD;  Location: [facility] ORS;   Service: Urology;;  . HERNIA REPAIR     left and right inguinal hernia repair  . RADIOACTIVE SEED IMPLANT N/A 06/24/2015   Procedure: RADIOACTIVE SEED IMPLANT/BRACHYTHERAPY IMPLANT;  Surgeon: Hollice Espy, MD;  Location: [facility] ORS;  Service: Urology;  Laterality: N/A;    SOCIAL HISTORY: Social History   Socioeconomic History  . Marital status: Married    Spouse name: Not on file  . Number of children: 1  . Years of education: Not on file  . Highest education level: 8th grade  Occupational History  . 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 Food insecurity:    Worry: Never true    Inability: Never true  . Transportation needs:    Medical: No    Non-medical: No  Tobacco Use  . Smoking status: Former Smoker    Packs/day: 1.50    Types: Cigarettes    Last attempt to quit: 05/24/1981    Years since quitting: 37.3  . Smokeless tobacco: Current User    Types: Chew  Substance and Sexual Activity  . Alcohol use: Yes    Alcohol/week: 0.0 standard drinks  . Drug use: No  . Sexual activity: Not on file  Lifestyle  . Physical activity:    Days per week: Not on file    Minutes per session: Not on file  . Stress: Only a little  Relationships  . Social connections:    Talks on phone: Not on file    Gets together: Not on file    Attends religious service: Not on file    Active member of club or organization: Not on file    Attends meetings of clubs or organizations: Not on file    Relationship status: Not on file  . Intimate partner violence:    Fear of current or ex partner: Not on file    Emotionally abused: Not on file    Physically abused: Not on file    Forced sexual activity: Not on file  Other Topics Concern  . Not on file  Social History Narrative  . Not on file    FAMILY HISTORY: Family History  Problem Relation Age of Onset  . Cancer Brother   . Heart disease Brother   . Diabetes Brother   . Emphysema Brother   .  Prostate cancer Brother   . Diabetes Sister   . Emphysema Mother   . Healthy Sister   . Healthy Sister   . Healthy Sister   . Diabetes Brother   . Diabetes Brother   . Diabetes Brother   . Healthy Brother     ALLERGIES:  is allergic to mevacor [lovastatin]; niacin and related; statins; and aleve [naproxen sodium].  MEDICATIONS:  Current Outpatient Medications  Medication Sig Dispense Refill  . Cholecalciferol (VITAMIN D-3) 1000 UNITS CAPS Take 1 capsule by mouth daily.     . Magnesium 400 MG TABS Take 1 tablet by mouth every other day.     . Red Yeast Rice 600 MG TABS Take 1 tablet by mouth at bedtime.     Marland Kitchen albuterol (PROAIR HFA) 108 (90 BASE) MCG/ACT inhaler Reported on 05/24/2016    . Coenzyme  Q10 (COQ-10) 100 MG CAPS Take 100 mg by mouth daily. Reported on 02/06/2016    . fluticasone furoate-vilanterol (BREO ELLIPTA) 100-25 MCG/INH AEPB Inhale 1 puff into the lungs daily. (Patient not taking: Reported on 08/29/2018) 14 each 0  . venlafaxine (EFFEXOR) 37.5 MG tablet Take 1 tablet (37.5 mg total) by mouth daily. (Patient not taking: Reported on 08/14/2018) 30 tablet 0   No current facility-administered medications for this visit.      PHYSICAL EXAMINATION: ECOG PERFORMANCE STATUS: 1 - Symptomatic but completely ambulatory Vitals:   09/11/18 0947  BP: 121/63  Pulse: (!) 56  Resp: 18  Temp: (!) 97.4 F (36.3 C)  SpO2: 99%   Filed Weights   09/11/18 0947  Weight: 147 lb 12.8 oz (67 kg)    Physical Exam  Constitutional: He is oriented to person, place, and time. He appears well-nourished. No distress.  HENT:  Head: Normocephalic and atraumatic.  Mouth/Throat: Oropharynx is clear and moist.  Eyes: Pupils are equal, round, and reactive to light. Conjunctivae and EOM are normal. No scleral icterus.  Neck: Normal range of motion. Neck supple.  Cardiovascular: Normal rate, regular rhythm and normal heart sounds.  Pulmonary/Chest: Effort normal. No respiratory distress. He has  no wheezes. He has no rales. He exhibits no tenderness.  Abdominal: Soft. Bowel sounds are normal. He exhibits no distension and no mass. There is no tenderness.  Musculoskeletal: Normal range of motion. He exhibits no edema or deformity.  Lymphadenopathy:    He has no cervical adenopathy.  Neurological: He is alert and oriented to person, place, and time. No cranial nerve deficit. Coordination normal.  Skin: Skin is warm and dry. No rash noted. No erythema.  Psychiatric: He has a normal mood and affect. His behavior is normal. Thought content normal.     LABORATORY DATA:  I have reviewed the data as listed Lab Results  Component Value Date   WBC 4.6 09/11/2018   HGB 14.4 09/11/2018   HCT 42.6 09/11/2018   MCV 88.3 09/11/2018   PLT 258 09/11/2018   Recent Labs    07/17/18 0830 08/11/18 0812 09/11/18 0928  NA 141 144 142  K 4.9 4.1 4.8  CL 100 105 104  CO2 31 31 30   GLUCOSE 86 105* 102*  BUN 23 17 17   CREATININE 0.98 1.16 0.84  CALCIUM 9.7 9.4 9.4  GFRNONAA >60 58* >60  GFRAA >60 >60 >60  PROT 7.0 6.5 6.6  ALBUMIN 4.0 3.9 3.9  AST 21 22 20   ALT 14 12 13   ALKPHOS 70 66 68  BILITOT 0.5 0.6 0.6   RADIOGRAPHIC STUDIES: I have personally reviewed the radiological images as listed and agreed with the findings in the report. 05/18/2018 bone scan negative for metastatic disease 08/01/2018 CT chest abdomen pelvis showed no evidence of metastatic disease.  Brachytherapy seeds in the prostate gland no findings of local regional adenopathy or metastatic disease.  Chronic findings of advanced atherosclerotic calcifications of thoracic and abdominal aorta and branch vessels.  Including 3 vessel coronary artery calcification.   ASSESSMENT & PLAN:  1. Malignant neoplasm of prostate ([place])   2. Other fatigue   3. Androgen deprivation therapy   4. Hot flushes, perimenopausal    # Prostate Cancer with biochemical recurrence, asymptomatic, castration sensitive.  Today's PSA is  pending.  If continued to improve, will give patient 6 to 8 weeks of break before restarted on Lupron. Discussed with patient about the rationale of intermittent ADT, which is associated  with better quality of life, less testosterone deprivation symptoms including hot flush and fatigue. I have discussed with urology Dr. Bernardo Heater and he feels patient is not a surgical candidate for radical prostatectomy at this point.  Discussed with patient about that.  He is not interested in surgery anyway. Plan repeat testosterone level and PSA in 4 weeks. He will follow-up with me in 8 weeks with repeat labs and restarted on ADT. Orders Placed This Encounter  Procedures  . Testosterone    Standing Status:   Future    Standing Expiration Date:   09/12/2019  . PSA    Standing Status:   Future    Standing Expiration Date:   09/12/2019  . Comprehensive metabolic panel    Standing Status:   Future    Standing Expiration Date:   09/12/2019  . CBC with Differential/Platelet    Standing Status:   Future    Standing Expiration Date:   09/12/2019  . Testosterone    Standing Status:   Future    Standing Expiration Date:   09/12/2019  . PSA    Standing Status:   Future    Standing Expiration Date:   09/12/2019    All questions were answered. The patient knows to call the clinic with any problems questions or concerns.  Return of visit: Labs in 4 weeks and follow-up in the clinic in 8 weeks. Orders Placed This Encounter  Procedures  . Testosterone    Standing Status:   Future    Standing Expiration Date:   09/12/2019  . PSA    Standing Status:   Future    Standing Expiration Date:   09/12/2019  . Comprehensive metabolic panel    Standing Status:   Future    Standing Expiration Date:   09/12/2019  . CBC with Differential/Platelet    Standing Status:   Future    Standing Expiration Date:   09/12/2019  . Testosterone    Standing Status:   Future    Standing Expiration Date:   09/12/2019  . PSA    Standing  Status:   Future    Standing Expiration Date:   09/12/2019   Total face to face encounter time for this patient visit was 25 min. >50% of the time was  spent in counseling and coordination of care.   Treavon Server, MD, PhD Hematology Oncology [facility] at [facility] Pager- [phone] 09/11/2018

## 2018-09-11 NOTE — Progress Notes (Signed)
Patient here for follow up. No concerns voiced.  °

## 2018-09-12 LAB — LIPID PANEL
Chol/HDL Ratio: 3.5 ratio (ref 0.0–5.0)
Cholesterol, Total: 207 mg/dL — ABNORMAL HIGH (ref 100–199)
HDL: 60 mg/dL (ref >39)
LDL Calculated: 134 mg/dL — ABNORMAL HIGH (ref 0–99)
TRIGLYCERIDES: 65 mg/dL (ref 0–149)
VLDL Cholesterol Cal: 13 mg/dL (ref 5–40)

## 2018-09-14 ENCOUNTER — Encounter: Payer: Self-pay | Admitting: Oncology

## 2018-09-18 ENCOUNTER — Telehealth: Payer: Self-pay

## 2018-09-18 NOTE — Telephone Encounter (Signed)
-----   Message from Margo Common, [place] sent at 09/15/2018  5:03 PM EDT ----- HDL level better and LDL coming down. Continue present medications and low fat diet. Recheck in 6 months.

## 2018-09-18 NOTE — Telephone Encounter (Signed)
Pt advised.   Thanks,   -Adeena Bernabe  

## 2018-10-03 ENCOUNTER — Ambulatory Visit (INDEPENDENT_AMBULATORY_CARE_PROVIDER_SITE_OTHER): Payer: PPO | Admitting: Family Medicine

## 2018-10-03 ENCOUNTER — Other Ambulatory Visit: Payer: Self-pay

## 2018-10-03 ENCOUNTER — Encounter: Payer: Self-pay | Admitting: Family Medicine

## 2018-10-03 VITALS — BP 112/60 | HR 64 | Temp 97.8°F | Ht 69.0 in | Wt 150.8 lb

## 2018-10-03 DIAGNOSIS — H6122 Impacted cerumen, left ear: Secondary | ICD-10-CM

## 2018-10-03 DIAGNOSIS — R42 Dizziness and giddiness: Secondary | ICD-10-CM

## 2018-10-03 NOTE — Progress Notes (Signed)
Patient: Sean Wood. Male    DOB: [birth year]   82 y.o.   MRN: [id number] Visit Date: 10/03/2018  Today's Provider: [name], PA   Chief Complaint  Patient presents with  . Dizziness    started 09/29/18   Subjective:    HPI  Pt reports he is having some lightheadedness this morning for a few minutes. It started Friday 09/29/18, made him sick on his stomach but only lasted a hour or two with some diarrhea. Also, was off balance trying to walk to the mailbox (200 ft.) 2 weeks ago (09-19-18). No nausea or vomiting at that time. Only lasted until he went in to the house and sat down for a minute or two.      Past Medical History:  Diagnosis Date  . Arthritis   . COPD (chronic obstructive pulmonary disease) ([place])   . Elevated PSA   . Hematuria, gross   . History of hiatal hernia   . HOH (hard of hearing)   . Kidney stone   . Prostate cancer ([place])   . Shortness of breath dyspnea    Past Surgical History:  Procedure Laterality Date  . [place]  . CYSTOSCOPY  06/24/2015   Procedure: CYSTOSCOPY;  Surgeon: Hollice Espy, MD;  Location: [facility] ORS;  Service: Urology;;  . HERNIA REPAIR     left and right inguinal hernia repair  . RADIOACTIVE SEED IMPLANT N/A 06/24/2015   Procedure: RADIOACTIVE SEED IMPLANT/BRACHYTHERAPY IMPLANT;  Surgeon: Hollice Espy, MD;  Location: [facility] ORS;  Service: Urology;  Laterality: N/A;   Family History  Problem Relation Age of Onset  . Cancer Brother   . Heart disease Brother   . Diabetes Brother   . Emphysema Brother   . Prostate cancer Brother   . Diabetes Sister   . Emphysema Mother   . Healthy Sister   . Healthy Sister   . Healthy Sister   . Diabetes Brother   . Diabetes Brother   . Diabetes Brother   . Healthy Brother    Allergies  Allergen Reactions  . Mevacor [Lovastatin] Other (See Comments)    "feel washed out"  . Niacin And Related Other (See Comments)    "hot feeling"  . Statins Other (See  Comments)    "feel washed out"  . Aleve [Naproxen Sodium] Rash    Current Outpatient Medications:  .  Cholecalciferol (VITAMIN D-3) 1000 UNITS CAPS, Take 1 capsule by mouth daily. , Disp: , Rfl:  .  Coenzyme Q10 (COQ-10) 100 MG CAPS, Take 100 mg by mouth daily. Reported on 02/06/2016, Disp: , Rfl:  .  Magnesium 400 MG TABS, Take 1 tablet by mouth every other day. , Disp: , Rfl:  .  Red Yeast Rice 600 MG TABS, Take 1 tablet by mouth at bedtime. , Disp: , Rfl:  .  albuterol (PROAIR HFA) 108 (90 BASE) MCG/ACT inhaler, Reported on 05/24/2016, Disp: , Rfl:  .  fluticasone furoate-vilanterol (BREO ELLIPTA) 100-25 MCG/INH AEPB, Inhale 1 puff into the lungs daily. (Patient not taking: Reported on 08/29/2018), Disp: 14 each, Rfl: 0 .  venlafaxine (EFFEXOR) 37.5 MG tablet, Take 1 tablet (37.5 mg total) by mouth daily. (Patient not taking: Reported on 08/14/2018), Disp: 30 tablet, Rfl: 0  Review of Systems  Constitutional: Negative.   HENT: Negative.   Eyes: Negative.   Respiratory: Negative.   Cardiovascular: Negative.   Gastrointestinal: Negative.   Endocrine: Negative.   Genitourinary:  Negative.   Musculoskeletal: Negative.   Skin: Negative.   Allergic/Immunologic: Negative.   Neurological: Negative.   Hematological: Negative.   Psychiatric/Behavioral: Negative.    Social History   Tobacco Use  . Smoking status: Former Smoker    Packs/day: 1.50    Types: Cigarettes    Last attempt to quit: 05/24/1981    Years since quitting: 37.3  . Smokeless tobacco: Current User    Types: Chew  Substance Use Topics  . Alcohol use: Not Currently    Alcohol/week: 0.0 standard drinks   Objective:   BP 112/60 (BP Location: Right Arm, Patient Position: Sitting, Cuff Size: Normal)   Pulse 64   Temp 97.8 F (36.6 C) (Oral)   Ht 5\' 9"  (1.753 m)   Wt 150 lb 12.8 oz (68.4 kg)   SpO2 98%   BMI 22.27 kg/m  Vitals:   10/03/18 0850  BP: 112/60  Pulse: 64  Temp: 97.8 F (36.6 C)  TempSrc: Oral  SpO2:  98%  Weight: 150 lb 12.8 oz (68.4 kg)  Height: 5\' 9"  (1.753 m)   Physical Exam  Constitutional: He is oriented to person, place, and time. He appears well-developed and well-nourished. No distress.  HENT:  Head: Normocephalic and atraumatic.  Right Ear: Hearing normal.  Left Ear: Hearing normal.  Nose: Nose normal.  Mouth/Throat: Oropharynx is clear and moist.  Left canal with dark cerumen against TM. No redness or drainage. No posterior pharynx irritation or nasal congestion.  Eyes: Conjunctivae and lids are normal. Right eye exhibits no discharge. Left eye exhibits no discharge. No scleral icterus.  Neck: Neck supple.  No carotid bruits.  Cardiovascular: Normal rate, regular rhythm and intact distal pulses.  Pulmonary/Chest: Effort normal. No respiratory distress.  Abdominal: Soft. Bowel sounds are normal.  Musculoskeletal: Normal range of motion.  Lymphadenopathy:    He has no cervical adenopathy.  Neurological: He is alert and oriented to person, place, and time.  Negative Romberg - no ataxia.  Skin: Skin is intact. No lesion and no rash noted.  Psychiatric: He has a normal mood and affect. His speech is normal and behavior is normal. Thought content normal.      Assessment & Plan:     1. Impacted cerumen of left ear Stopped up left ear without recent congestion or fever. Cleared cerumen with irrigation and hearing returned to normal. No sign of TM infection. Recheck prn.  2. Episode of dizziness Couple episodes of being off balance over the past 2 weeks with ears feeling stopped up. After irrigating cerumen impaction out of the left ear, hearing returned to normal and no dizziness. Recheck prn.       [name], PA  [facility]

## 2018-10-03 NOTE — Addendum Note (Signed)
Addended by: Edd Arbour B on: 10/03/2018 03:01 PM   Modules accepted: Orders

## 2018-10-09 ENCOUNTER — Inpatient Hospital Stay: Payer: PPO | Attending: Oncology

## 2018-10-09 ENCOUNTER — Other Ambulatory Visit: Payer: Self-pay

## 2018-10-09 DIAGNOSIS — C61 Malignant neoplasm of prostate: Secondary | ICD-10-CM | POA: Insufficient documentation

## 2018-10-09 LAB — PSA: PROSTATIC SPECIFIC ANTIGEN: 0.67 ng/mL (ref 0.00–4.00)

## 2018-10-10 LAB — TESTOSTERONE

## 2018-11-01 ENCOUNTER — Other Ambulatory Visit: Payer: PPO

## 2018-11-06 ENCOUNTER — Inpatient Hospital Stay: Payer: PPO

## 2018-11-06 ENCOUNTER — Inpatient Hospital Stay: Payer: PPO | Attending: Oncology | Admitting: Oncology

## 2018-11-06 ENCOUNTER — Encounter: Payer: Self-pay | Admitting: Oncology

## 2018-11-06 ENCOUNTER — Other Ambulatory Visit: Payer: Self-pay

## 2018-11-06 ENCOUNTER — Ambulatory Visit
Admission: RE | Admit: 2018-11-06 | Discharge: 2018-11-06 | Disposition: A | Discharge status: Home / Self Care | Payer: PPO | Source: Ambulatory Visit | Attending: Radiation Oncology | Admitting: Radiation Oncology

## 2018-11-06 VITALS — BP 132/62 | HR 57 | Temp 95.0°F | Wt 150.3 lb

## 2018-11-06 DIAGNOSIS — Z79818 Long term (current) use of other agents affecting estrogen receptors and estrogen levels: Secondary | ICD-10-CM

## 2018-11-06 DIAGNOSIS — Z5111 Encounter for antineoplastic chemotherapy: Secondary | ICD-10-CM | POA: Diagnosis not present

## 2018-11-06 DIAGNOSIS — Z87891 Personal history of nicotine dependence: Secondary | ICD-10-CM

## 2018-11-06 DIAGNOSIS — C61 Malignant neoplasm of prostate: Secondary | ICD-10-CM

## 2018-11-06 DIAGNOSIS — Z923 Personal history of irradiation: Secondary | ICD-10-CM | POA: Diagnosis not present

## 2018-11-06 DIAGNOSIS — R5383 Other fatigue: Secondary | ICD-10-CM | POA: Diagnosis not present

## 2018-11-06 DIAGNOSIS — E291 Testicular hypofunction: Secondary | ICD-10-CM | POA: Diagnosis not present

## 2018-11-06 LAB — CBC WITH DIFFERENTIAL/PLATELET
Abs Immature Granulocytes: 0 10*3/uL (ref 0.00–0.07)
Basophils Absolute: 0 10*3/uL (ref 0.0–0.1)
Basophils Relative: 1 %
EOS PCT: 1 %
Eosinophils Absolute: 0 10*3/uL (ref 0.0–0.5)
HCT: 43.1 % (ref 39.0–52.0)
HEMOGLOBIN: 13.6 g/dL (ref 13.0–17.0)
Immature Granulocytes: 0 %
LYMPHS ABS: 1.5 10*3/uL (ref 0.7–4.0)
LYMPHS PCT: 34 %
MCH: 28.5 pg (ref 26.0–34.0)
MCHC: 31.6 g/dL (ref 30.0–36.0)
MCV: 90.2 fL (ref 80.0–100.0)
MONO ABS: 0.3 10*3/uL (ref 0.1–1.0)
MONOS PCT: 6 %
Neutro Abs: 2.5 10*3/uL (ref 1.7–7.7)
Neutrophils Relative %: 58 %
Platelets: 241 10*3/uL (ref 150–400)
RBC: 4.78 MIL/uL (ref 4.22–5.81)
RDW: 12.5 % (ref 11.5–15.5)
WBC: 4.3 10*3/uL (ref 4.0–10.5)
nRBC: 0 % (ref 0.0–0.2)

## 2018-11-06 LAB — COMPREHENSIVE METABOLIC PANEL
ALK PHOS: 63 U/L (ref 38–126)
ALT: 11 U/L (ref 0–44)
ANION GAP: 6 (ref 5–15)
AST: 20 U/L (ref 15–41)
Albumin: 4 g/dL (ref 3.5–5.0)
BILIRUBIN TOTAL: 0.3 mg/dL (ref 0.3–1.2)
BUN: 16 mg/dL (ref 8–23)
CALCIUM: 9.4 mg/dL (ref 8.9–10.3)
CO2: 32 mmol/L (ref 22–32)
CREATININE: 0.88 mg/dL (ref 0.61–1.24)
Chloride: 103 mmol/L (ref 98–111)
GFR calc non Af Amer: >60 mL/min (ref >60)
GLUCOSE: 113 mg/dL — AB (ref 70–99)
Potassium: 4.2 mmol/L (ref 3.5–5.1)
Sodium: 141 mmol/L (ref 135–145)
Total Protein: 6.6 g/dL (ref 6.5–8.1)

## 2018-11-06 LAB — PSA: Prostatic Specific Antigen: 0.68 ng/mL (ref 0.00–4.00)

## 2018-11-06 MED ORDER — LEUPROLIDE ACETATE (3 MONTH) 22.5 MG IM KIT
22.5000 mg | PACK | Freq: Once | INTRAMUSCULAR | Status: AC
  Administered 2018-11-06: 22.5 mg via INTRAMUSCULAR
  Filled 2018-11-06: qty 22.5

## 2018-11-06 NOTE — Progress Notes (Signed)
Patient here today for follow up.  Patient states no new concerns today  

## 2018-11-06 NOTE — Progress Notes (Signed)
Radiation Oncology Follow up Note  Name: Sean Wood.   Date:   11/06/2018 MRN:  [id number] DOB: 27-Sep-1936    This 82 y.o. male presents to the clinic today for 3-1/2 year follow-up patient received I-125 interstitial implant for salvage for adenocarcinoma the prostate.  REFERRING PROVIDER: Margo Common, PA  HPI: patient is a 82 year old male originally treated back in 2009 when he presented with a Gleason 7 (3+4) adenocarcinoma the prostate with a PSA of 6.1. I performed I MRT treatment planning and delivery back in 2009 although he had biochemical progression in 2014 and underwent salvage I-125 interstitial implant. At that time her focus of Gleason 6 (3+3) adenocarcinoma. He is seen today in routine follow-up 3 and half years out from his implant. He has been receiving androgen deprivation therapy by medical oncology. His PSAs have been steady at.0.67. He specifically denies any increased lower urinary tract symptoms or diarrhea.  COMPLICATIONS OF TREATMENT: none  FOLLOW UP COMPLIANCE: keeps appointments   PHYSICAL EXAM:  There were no vitals taken for this visit. Well-developed well-nourished patient in NAD. HEENT reveals PERLA, EOMI, discs not visualized.  Oral cavity is clear. No oral mucosal lesions are identified. Neck is clear without evidence of cervical or supraclavicular adenopathy. Lungs are clear to A&P. Cardiac examination is essentially unremarkable with regular rate and rhythm without murmur rub or thrill. Abdomen is benign with no organomegaly or masses noted. Motor sensory and DTR levels are equal and symmetric in the upper and lower extremities. Cranial nerves II through XII are grossly intact. Proprioception is intact. No peripheral adenopathy or edema is identified. No motor or sensory levels are noted. Crude visual fields are within normal range.  RADIOLOGY RESULTS: no current films for review  PLAN: resent time patient is doing well with his disease  stable by PSA criteria. He continues close follow-up care and androgen deprivation therapy by medical oncology. I have asked to see him back in 1 year for follow-up. Patient is to call with any concerns at any time. He is having no bony pain at this point.  I would like to take this opportunity to thank you for allowing me to participate in the care of your patient.Noreene Filbert, MD

## 2018-11-06 NOTE — Progress Notes (Signed)
Hematology/Oncology Follow up note [facility] [phone] [phone]   Patient Care Team: Chrismon, Vickki Muff, PA as PCP - General (Physician Assistant) Garrel Ridgel, DPM as Consulting Physician (Podiatry) Noreene Filbert, MD as Referring Physician (Radiation Oncology) Idelle Leech, OD as Consulting Physician (Optometry) REASON FOR VISIT Follow up for treatment of biochemical recurrence of prostate cancer.  HISTORY OF PRESENTING ILLNESS:  Sean Venezia. is a  82 y.o.  male with PMH listed below who was referred to me for evaluation of prostate cancer.   Cancer history dated back to 2009 when He was diagnosed with intermediate risk, Gleason 3+4, T1c prostate cancer in 2009. PSA at diagnosis was 6.1. IMRT was completed in August 2009. PSA nadir was 0.5. His PSA January 2013 was 0.8 and in January 2014 was 1.3. This was repeated in May 2014 and had increased to 1.8. He elected a repeat prostate biopsy in November 2014 after his PSA in October had increased to 2.2. This showed a focus of Gleason 3+3 adenocarcinoma from the right prostate involving less than 5%. He elected surveillance after discussing options. In May 2016, he was evaluated by Dr.Chrystal and he received salvage seed implantation with I-125. He subsequently follows with Dr.Chrystal for Lupron every 4 months since then. Achieve PSA nadir of 0.06 in 09/2016, and started to trend up and most recently trended up to 9.64.   INTERVAL HISTORY Sean Wood. is a 82 y.o. male who has above history reviewed by me present for follow-up of management of biochemical recurrence of prostate cancer, castration sensitive. Status post Lupron 30 mg on 05/10/2018, testosterone level was at 3, within castration level. He supposed to had another Lupron injection 2 months ago, at that time he is feeling very fatigued so we discussed about holding Lupron for 2 months and restart today.  Patient  reports that hot flash has slightly get better.  Fatigue is at baseline.  Slightly better.  Denies any bone pain. 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 Able to do his ADL and iADL.    Review of Systems  Constitutional: Positive for malaise/fatigue. Negative for chills, fever and weight loss.  HENT: Negative for congestion, ear discharge, ear pain, nosebleeds, sinus pain and sore throat.   Eyes: Negative for double vision, photophobia, pain, discharge and redness.  Respiratory: Positive for shortness of breath. Negative for cough, hemoptysis, sputum production and wheezing.   Cardiovascular: Negative for chest pain, palpitations, orthopnea, claudication and leg swelling.  Gastrointestinal: Negative for abdominal pain, blood in stool, constipation, diarrhea, heartburn, melena, nausea and vomiting.  Genitourinary: Negative for dysuria, flank pain, frequency and hematuria.  Musculoskeletal: Negative for back pain, myalgias and neck pain.  Skin: Negative for itching and rash.       Hot flush  Neurological: Negative for dizziness, tingling, tremors, focal weakness, weakness and headaches.  Endo/Heme/Allergies: Negative for environmental allergies. Does not bruise/bleed easily.  Psychiatric/Behavioral: Negative for depression and hallucinations. The patient is not nervous/anxious.     MEDICAL HISTORY:  Past Medical History:  Diagnosis Date  . Arthritis   . COPD (chronic obstructive pulmonary disease) ([place])   . Elevated PSA   . Hematuria, gross   . History of hiatal hernia   . HOH (hard of hearing)   . Kidney stone   . Prostate cancer ([place])   . Shortness of breath dyspnea     SURGICAL HISTORY: Past Surgical History:  Procedure Laterality  Date  . [place]  . CYSTOSCOPY  06/24/2015   Procedure: CYSTOSCOPY;  Surgeon: Hollice Espy, MD;  Location: [facility] ORS;  Service: Urology;;  . HERNIA REPAIR     left and right inguinal hernia repair   . RADIOACTIVE SEED IMPLANT N/A 06/24/2015   Procedure: RADIOACTIVE SEED IMPLANT/BRACHYTHERAPY IMPLANT;  Surgeon: Hollice Espy, MD;  Location: [facility] ORS;  Service: Urology;  Laterality: N/A;    SOCIAL HISTORY: Social History   Socioeconomic History  . Marital status: Married    Spouse name: Not on file  . Number of children: 1  . Years of education: Not on file  . Highest education level: 8th grade  Occupational History  . 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 Food insecurity:    Worry: Never true    Inability: Never true  . Transportation needs:    Medical: No    Non-medical: No  Tobacco Use  . Smoking status: Former Smoker    Packs/day: 1.50    Types: Cigarettes    Last attempt to quit: 05/24/1981    Years since quitting: 37.4  . Smokeless tobacco: Current User    Types: Chew  Substance and Sexual Activity  . Alcohol use: Not Currently    Alcohol/week: 0.0 standard drinks  . Drug use: No  . Sexual activity: Not on file  Lifestyle  . Physical activity:    Days per week: Not on file    Minutes per session: Not on file  . Stress: Only a little  Relationships  . Social connections:    Talks on phone: Not on file    Gets together: Not on file    Attends religious service: Not on file    Active member of club or organization: Not on file    Attends meetings of clubs or organizations: Not on file    Relationship status: Not on file  . Intimate partner violence:    Fear of current or ex partner: Not on file    Emotionally abused: Not on file    Physically abused: Not on file    Forced sexual activity: Not on file  Other Topics Concern  . Not on file  Social History Narrative  . Not on file    FAMILY HISTORY: Family History  Problem Relation Age of Onset  . Cancer Brother   . Heart disease Brother   . Diabetes Brother   . Emphysema Brother   . Prostate cancer Brother   . Diabetes Sister   . Emphysema Mother   .  Healthy Sister   . Healthy Sister   . Healthy Sister   . Diabetes Brother   . Diabetes Brother   . Diabetes Brother   . Healthy Brother     ALLERGIES:  is allergic to mevacor [lovastatin]; niacin and related; statins; and aleve [naproxen sodium].  MEDICATIONS:  Current Outpatient Medications  Medication Sig Dispense Refill  . albuterol (PROAIR HFA) 108 (90 BASE) MCG/ACT inhaler Reported on 05/24/2016    . Cholecalciferol (VITAMIN D-3) 1000 UNITS CAPS Take 1 capsule by mouth daily.     . Coenzyme Q10 (COQ-10) 100 MG CAPS Take 100 mg by mouth daily. Reported on 02/06/2016    . fluticasone furoate-vilanterol (BREO ELLIPTA) 100-25 MCG/INH AEPB Inhale 1 puff into the lungs daily. 14 each 0  . Magnesium 400 MG TABS Take 1 tablet by mouth every other day.     Marland Kitchen  Red Yeast Rice 600 MG TABS Take 1 tablet by mouth at bedtime.     Marland Kitchen venlafaxine (EFFEXOR) 37.5 MG tablet Take 1 tablet (37.5 mg total) by mouth daily. 30 tablet 0   No current facility-administered medications for this visit.      PHYSICAL EXAMINATION: ECOG PERFORMANCE STATUS: 1 - Symptomatic but completely ambulatory Vitals:   11/06/18 0959  BP: 132/62  Pulse: (!) 57  Temp: (!) 95 F (35 C)   Filed Weights   11/06/18 0959  Weight: 150 lb 5 oz (68.2 kg)    Physical Exam  Constitutional: He is oriented to person, place, and time. He appears well-nourished. No distress.  HENT:  Head: Normocephalic and atraumatic.  Mouth/Throat: Oropharynx is clear and moist.  Eyes: Pupils are equal, round, and reactive to light. Conjunctivae and EOM are normal. No scleral icterus.  Neck: Normal range of motion. Neck supple.  Cardiovascular: Normal rate, regular rhythm and normal heart sounds.  Pulmonary/Chest: Effort normal. No respiratory distress. He has no wheezes. He has no rales. He exhibits no tenderness.  Abdominal: Soft. Bowel sounds are normal. He exhibits no distension and no mass. There is no tenderness.  Musculoskeletal:  Normal range of motion. He exhibits no edema or deformity.  Lymphadenopathy:    He has no cervical adenopathy.  Neurological: He is alert and oriented to person, place, and time. No cranial nerve deficit. Coordination normal.  Skin: Skin is warm and dry. No rash noted. No erythema.  Psychiatric: He has a normal mood and affect. His behavior is normal. Thought content normal.     LABORATORY DATA:  I have reviewed the data as listed Lab Results  Component Value Date   WBC 4.3 11/06/2018   HGB 13.6 11/06/2018   HCT 43.1 11/06/2018   MCV 90.2 11/06/2018   PLT 241 11/06/2018   Recent Labs    08/11/18 0812 09/11/18 0928 11/06/18 0914  NA 144 142 141  K 4.1 4.8 4.2  CL 105 104 103  CO2 31 30 32  GLUCOSE 105* 102* 113*  BUN 17 17 16   CREATININE 1.16 0.84 0.88  CALCIUM 9.4 9.4 9.4  GFRNONAA 58* >60 >60  GFRAA >60 >60 >60  PROT 6.5 6.6 6.6  ALBUMIN 3.9 3.9 4.0  AST 22 20 20   ALT 12 13 11   ALKPHOS 66 68 63  BILITOT 0.6 0.6 0.3   RADIOGRAPHIC STUDIES: I have personally reviewed the radiological images as listed and agreed with the findings in the report. 05/18/2018 bone scan negative for metastatic disease 08/01/2018 CT chest abdomen pelvis showed no evidence of metastatic disease.  Brachytherapy seeds in the prostate gland no findings of local regional adenopathy or metastatic disease.  Chronic findings of advanced atherosclerotic calcifications of thoracic and abdominal aorta and branch vessels.  Including 3 vessel coronary artery calcification.   ASSESSMENT & PLAN:  1. Malignant neoplasm of prostate ([place])   2. Other fatigue   3. Androgen deprivation therapy    # Prostate Cancer with biochemical recurrence, asymptomatic, castration sensitive.  He is off Lupron for 8 weeks. Fatigue and hot flash slightly improved. PSA and testosterone level pending today. Labs reviewed and discussed with patient. Proceed with Lupron 22.5 mg today. Patient will follow-up with me in 12  weeks with repeat labs and assessment of ADT  All questions were answered. The patient knows to call the clinic with any problems questions or concerns.  Return of visit: Labs in 4 weeks and follow-up in the clinic  in 8 weeks. Orders Placed This Encounter  Procedures  . CBC with Differential/Platelet    Standing Status:   Future    Standing Expiration Date:   11/06/2019  . Comprehensive metabolic panel    Standing Status:   Future    Standing Expiration Date:   11/06/2019  . Testosterone    Standing Status:   Future    Standing Expiration Date:   11/06/2019  . PSA    Standing Status:   Future    Standing Expiration Date:   11/07/2019   Cletis Server, MD, PhD Hematology Oncology [facility] at [facility] Pager- [phone] 11/06/2018

## 2018-11-07 LAB — TESTOSTERONE: Testosterone: 28 ng/dL — ABNORMAL LOW (ref 264–916)

## 2018-11-08 ENCOUNTER — Ambulatory Visit: Payer: PPO | Admitting: Radiation Oncology

## 2018-11-29 DIAGNOSIS — L400 Psoriasis vulgaris: Secondary | ICD-10-CM | POA: Diagnosis not present

## 2019-02-02 ENCOUNTER — Inpatient Hospital Stay: Payer: PPO | Attending: Oncology

## 2019-02-02 DIAGNOSIS — Z79899 Other long term (current) drug therapy: Secondary | ICD-10-CM | POA: Diagnosis not present

## 2019-02-02 DIAGNOSIS — Z809 Family history of malignant neoplasm, unspecified: Secondary | ICD-10-CM | POA: Diagnosis not present

## 2019-02-02 DIAGNOSIS — R232 Flushing: Secondary | ICD-10-CM | POA: Diagnosis not present

## 2019-02-02 DIAGNOSIS — Z5111 Encounter for antineoplastic chemotherapy: Secondary | ICD-10-CM | POA: Insufficient documentation

## 2019-02-02 DIAGNOSIS — J439 Emphysema, unspecified: Secondary | ICD-10-CM | POA: Diagnosis not present

## 2019-02-02 DIAGNOSIS — Z8042 Family history of malignant neoplasm of prostate: Secondary | ICD-10-CM | POA: Insufficient documentation

## 2019-02-02 DIAGNOSIS — C61 Malignant neoplasm of prostate: Secondary | ICD-10-CM | POA: Diagnosis not present

## 2019-02-02 DIAGNOSIS — Z720 Tobacco use: Secondary | ICD-10-CM | POA: Diagnosis not present

## 2019-02-02 LAB — CBC WITH DIFFERENTIAL/PLATELET
ABS IMMATURE GRANULOCYTES: 0 10*3/uL (ref 0.00–0.07)
Basophils Absolute: 0 10*3/uL (ref 0.0–0.1)
Basophils Relative: 1 %
EOS ABS: 0.1 10*3/uL (ref 0.0–0.5)
Eosinophils Relative: 2 %
HEMATOCRIT: 46.2 % (ref 39.0–52.0)
HEMOGLOBIN: 14.5 g/dL (ref 13.0–17.0)
IMMATURE GRANULOCYTES: 0 %
LYMPHS ABS: 2 10*3/uL (ref 0.7–4.0)
LYMPHS PCT: 39 %
MCH: 28.1 pg (ref 26.0–34.0)
MCHC: 31.4 g/dL (ref 30.0–36.0)
MCV: 89.5 fL (ref 80.0–100.0)
MONOS PCT: 7 %
Monocytes Absolute: 0.4 10*3/uL (ref 0.1–1.0)
NEUTROS ABS: 2.6 10*3/uL (ref 1.7–7.7)
Neutrophils Relative %: 51 %
Platelets: 253 10*3/uL (ref 150–400)
RBC: 5.16 MIL/uL (ref 4.22–5.81)
RDW: 13 % (ref 11.5–15.5)
WBC: 5.1 10*3/uL (ref 4.0–10.5)
nRBC: 0 % (ref 0.0–0.2)

## 2019-02-02 LAB — COMPREHENSIVE METABOLIC PANEL
ALBUMIN: 4.2 g/dL (ref 3.5–5.0)
ALK PHOS: 58 U/L (ref 38–126)
ALT: 11 U/L (ref 0–44)
ANION GAP: 7 (ref 5–15)
AST: 21 U/L (ref 15–41)
BUN: 20 mg/dL (ref 8–23)
CALCIUM: 9.6 mg/dL (ref 8.9–10.3)
CHLORIDE: 103 mmol/L (ref 98–111)
CO2: 32 mmol/L (ref 22–32)
Creatinine, Ser: 0.97 mg/dL (ref 0.61–1.24)
GFR calc Af Amer: >60 mL/min (ref >60)
GFR calc non Af Amer: >60 mL/min (ref >60)
GLUCOSE: 97 mg/dL (ref 70–99)
Potassium: 4.3 mmol/L (ref 3.5–5.1)
SODIUM: 142 mmol/L (ref 135–145)
Total Bilirubin: 0.7 mg/dL (ref 0.3–1.2)
Total Protein: 7.2 g/dL (ref 6.5–8.1)

## 2019-02-02 LAB — PSA: PROSTATIC SPECIFIC ANTIGEN: 0.73 ng/mL (ref 0.00–4.00)

## 2019-02-03 LAB — TESTOSTERONE: TESTOSTERONE: 4 ng/dL — AB (ref 264–916)

## 2019-02-05 ENCOUNTER — Inpatient Hospital Stay: Payer: PPO

## 2019-02-05 ENCOUNTER — Other Ambulatory Visit: Payer: Self-pay

## 2019-02-05 ENCOUNTER — Encounter: Payer: Self-pay | Admitting: Oncology

## 2019-02-05 ENCOUNTER — Inpatient Hospital Stay (HOSPITAL_BASED_OUTPATIENT_CLINIC_OR_DEPARTMENT_OTHER): Payer: PPO | Admitting: Oncology

## 2019-02-05 ENCOUNTER — Other Ambulatory Visit: Payer: PPO

## 2019-02-05 VITALS — BP 112/70 | HR 65 | Temp 96.9°F | Resp 18 | Wt 150.8 lb

## 2019-02-05 DIAGNOSIS — C61 Malignant neoplasm of prostate: Secondary | ICD-10-CM

## 2019-02-05 DIAGNOSIS — R232 Flushing: Secondary | ICD-10-CM | POA: Diagnosis not present

## 2019-02-05 DIAGNOSIS — J439 Emphysema, unspecified: Secondary | ICD-10-CM

## 2019-02-05 DIAGNOSIS — Z79818 Long term (current) use of other agents affecting estrogen receptors and estrogen levels: Secondary | ICD-10-CM

## 2019-02-05 DIAGNOSIS — Z87891 Personal history of nicotine dependence: Secondary | ICD-10-CM | POA: Diagnosis not present

## 2019-02-05 DIAGNOSIS — N951 Menopausal and female climacteric states: Secondary | ICD-10-CM

## 2019-02-05 DIAGNOSIS — E291 Testicular hypofunction: Secondary | ICD-10-CM

## 2019-02-05 DIAGNOSIS — Z5111 Encounter for antineoplastic chemotherapy: Secondary | ICD-10-CM | POA: Diagnosis not present

## 2019-02-05 MED ORDER — LEUPROLIDE ACETATE (4 MONTH) 30 MG IM KIT
30.0000 mg | PACK | Freq: Once | INTRAMUSCULAR | Status: AC
  Administered 2019-02-05: 30 mg via INTRAMUSCULAR
  Filled 2019-02-05: qty 30

## 2019-02-05 NOTE — Progress Notes (Signed)
Patient here for follow up. No concerns voiced.  °

## 2019-02-05 NOTE — Progress Notes (Signed)
Hematology/Oncology Follow up note [facility] Telephone:(336) [phone] Fax:(336) [phone]   Patient Care Team: Chrismon, Vickki Muff, PA as PCP - General (Physician Assistant) Garrel Ridgel, DPM as Consulting Physician (Podiatry) Noreene Filbert, MD as Referring Physician (Radiation Oncology) Idelle Leech, OD as Consulting Physician (Optometry) REASON FOR VISIT Follow up for treatment of biochemical recurrence of prostate cancer.  HISTORY OF PRESENTING ILLNESS:  Sean Kilpatrick. is a  83 y.o.  male with PMH listed below who was referred to me for evaluation of prostate cancer.   Cancer history dated back to 2009 when He was diagnosed with intermediate risk, Gleason 3+4, T1c prostate cancer in 2009. PSA at diagnosis was 6.1. IMRT was completed in August 2009. PSA nadir was 0.5. His PSA January 2013 was 0.8 and in January 2014 was 1.3. This was repeated in May 2014 and had increased to 1.8. He elected a repeat prostate biopsy in November 2014 after his PSA in October had increased to 2.2. This showed a focus of Gleason 3+3 adenocarcinoma from the right prostate involving less than 5%. He elected surveillance after discussing options. In May 2016, he was evaluated by Dr.Chrystal and he received salvage seed implantation with I-125. He subsequently follows with Dr.Chrystal for Lupron every 4 months since then. Achieve PSA nadir of 0.06 in 09/2016, and started to trend up and most recently trended up to 9.64.   # Lupron 30 mg on 05/10/2018, 06/14/2018 testosterone level was at 6  # Leupron 22.5mg  11/06/2018.  Delayed as patient reports feeling very fatigued and we discussed about holding Lupron.  42-month and restarted on 11/06/2018.  INTERVAL HISTORY Sean Wood. is a 83 y.o. male who has above history reviewed by me present for follow-up of management of biochemical recurrence of prostate cancer, castration sensitive. Today patient reports feeling well at baseline.   Manageable hot flashes. Chronic shortness of breath at baseline.  He used her inhaler which was a sample that given by primary care physician.  He did not continue after finish sample as" it is doing a good"" I do not feel a difference".  Able to do his ADL and iADL.    Review of Systems  Constitutional: Positive for malaise/fatigue. Negative for chills, fever and weight loss.  HENT: Negative for congestion, ear discharge, ear pain, nosebleeds, sinus pain and sore throat.   Eyes: Negative for double vision, photophobia, pain, discharge and redness.  Respiratory: Positive for shortness of breath. Negative for cough, hemoptysis, sputum production and wheezing.   Cardiovascular: Negative for chest pain, palpitations, orthopnea, claudication and leg swelling.  Gastrointestinal: Negative for abdominal pain, blood in stool, constipation, diarrhea, heartburn, melena, nausea and vomiting.  Genitourinary: Negative for dysuria, flank pain, frequency and hematuria.  Musculoskeletal: Negative for back pain, myalgias and neck pain.  Skin: Negative for itching and rash.       Hot flush  Neurological: Negative for dizziness, tingling, tremors, focal weakness, weakness and headaches.  Endo/Heme/Allergies: Negative for environmental allergies. Does not bruise/bleed easily.  Psychiatric/Behavioral: Negative for depression and hallucinations. The patient is not nervous/anxious.     MEDICAL HISTORY:  Past Medical History:  Diagnosis Date  . Arthritis   . COPD (chronic obstructive pulmonary disease) ([place])   . Elevated PSA   . Hematuria, gross   . History of hiatal hernia   . HOH (hard of hearing)   . Kidney stone   . Prostate cancer ([place])   . Shortness of breath dyspnea  SURGICAL HISTORY: Past Surgical History:  Procedure Laterality Date  . [place]  . CYSTOSCOPY  06/24/2015   Procedure: CYSTOSCOPY;  Surgeon: Hollice Espy, MD;  Location: [facility] ORS;  Service: Urology;;  . HERNIA  REPAIR     left and right inguinal hernia repair  . RADIOACTIVE SEED IMPLANT N/A 06/24/2015   Procedure: RADIOACTIVE SEED IMPLANT/BRACHYTHERAPY IMPLANT;  Surgeon: Hollice Espy, MD;  Location: [facility] ORS;  Service: Urology;  Laterality: N/A;    SOCIAL HISTORY: Social History   Socioeconomic History  . Marital status: Married    Spouse name: Not on file  . Number of children: 1  . Years of education: Not on file  . Highest education level: 8th grade  Occupational History  . 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 Food insecurity:    Worry: Never true    Inability: Never true  . Transportation needs:    Medical: No    Non-medical: No  Tobacco Use  . Smoking status: Former Smoker    Packs/day: 1.50    Types: Cigarettes    Last attempt to quit: 05/24/1981    Years since quitting: 37.7  . Smokeless tobacco: Current User    Types: Chew  Substance and Sexual Activity  . Alcohol use: Not Currently    Alcohol/week: 0.0 standard drinks  . Drug use: No  . Sexual activity: Not on file  Lifestyle  . Physical activity:    Days per week: Not on file    Minutes per session: Not on file  . Stress: Only a little  Relationships  . Social connections:    Talks on phone: Not on file    Gets together: Not on file    Attends religious service: Not on file    Active member of club or organization: Not on file    Attends meetings of clubs or organizations: Not on file    Relationship status: Not on file  . Intimate partner violence:    Fear of current or ex partner: Not on file    Emotionally abused: Not on file    Physically abused: Not on file    Forced sexual activity: Not on file  Other Topics Concern  . Not on file  Social History Narrative  . Not on file    FAMILY HISTORY: Family History  Problem Relation Age of Onset  . Cancer Brother   . Heart disease Brother   . Diabetes Brother   . Emphysema Brother   . Prostate cancer  Brother   . Diabetes Sister   . Emphysema Mother   . Healthy Sister   . Healthy Sister   . Healthy Sister   . Diabetes Brother   . Diabetes Brother   . Diabetes Brother   . Healthy Brother     ALLERGIES:  is allergic to mevacor [lovastatin]; niacin and related; statins; and aleve [naproxen sodium].  MEDICATIONS:  Current Outpatient Medications  Medication Sig Dispense Refill  . Cholecalciferol (VITAMIN D-3) 1000 UNITS CAPS Take 1 capsule by mouth daily.     . clobetasol (TEMOVATE) 0.05 % external solution     . Coenzyme Q10 (COQ-10) 100 MG CAPS Take 100 mg by mouth daily. Reported on 02/06/2016    . Magnesium 400 MG TABS Take 1 tablet by mouth every other day.     . Red Yeast Rice 600 MG TABS Take 1 tablet by mouth at bedtime.     Marland Kitchen  triamcinolone cream (KENALOG) 0.1 %     . albuterol (PROAIR HFA) 108 (90 BASE) MCG/ACT inhaler Reported on 05/24/2016    . fluticasone furoate-vilanterol (BREO ELLIPTA) 100-25 MCG/INH AEPB Inhale 1 puff into the lungs daily. (Patient not taking: Reported on 02/05/2019) 14 each 0  . venlafaxine (EFFEXOR) 37.5 MG tablet Take 1 tablet (37.5 mg total) by mouth daily. (Patient not taking: Reported on 02/05/2019) 30 tablet 0   No current facility-administered medications for this visit.      PHYSICAL EXAMINATION: ECOG PERFORMANCE STATUS: 1 - Symptomatic but completely ambulatory Vitals:   02/05/19 1008  BP: 112/70  Pulse: 65  Resp: 18  Temp: (!) 96.9 F (36.1 C)   Filed Weights   02/05/19 1008  Weight: 150 lb 12.8 oz (68.4 kg)    Physical Exam Constitutional:      General: He is not in acute distress. HENT:     Head: Normocephalic and atraumatic.  Eyes:     General: No scleral icterus.    Conjunctiva/sclera: Conjunctivae normal.     Pupils: Pupils are equal, round, and reactive to light.  Neck:     Musculoskeletal: Normal range of motion and neck supple.  Cardiovascular:     Rate and Rhythm: Normal rate and regular rhythm.     Heart sounds:  Normal heart sounds.  Pulmonary:     Effort: Pulmonary effort is normal. No respiratory distress.     Breath sounds: No wheezing or rales.  Chest:     Chest wall: No tenderness.  Abdominal:     General: Bowel sounds are normal. There is no distension.     Palpations: Abdomen is soft. There is no mass.     Tenderness: There is no abdominal tenderness.  Musculoskeletal: Normal range of motion.        General: No deformity.  Lymphadenopathy:     Cervical: No cervical adenopathy.  Skin:    General: Skin is warm and dry.     Findings: No erythema or rash.  Neurological:     Mental Status: He is alert and oriented to person, place, and time.     Cranial Nerves: No cranial nerve deficit.     Coordination: Coordination normal.  Psychiatric:        Behavior: Behavior normal.        Thought Content: Thought content normal.      LABORATORY DATA:  I have reviewed the data as listed Lab Results  Component Value Date   WBC 5.1 02/02/2019   HGB 14.5 02/02/2019   HCT 46.2 02/02/2019   MCV 89.5 02/02/2019   PLT 253 02/02/2019   Recent Labs    09/11/18 0928 11/06/18 0914 02/02/19 1025  NA 142 141 142  K 4.8 4.2 4.3  CL 104 103 103  CO2 30 32 32  GLUCOSE 102* 113* 97  BUN 17 16 20   CREATININE 0.84 0.88 0.97  CALCIUM 9.4 9.4 9.6  GFRNONAA >60 >60 >60  GFRAA >60 >60 >60  PROT 6.6 6.6 7.2  ALBUMIN 3.9 4.0 4.2  AST 20 20 21   ALT 13 11 11   ALKPHOS 68 63 58  BILITOT 0.6 0.3 0.7   RADIOGRAPHIC STUDIES: I have personally reviewed the radiological images as listed and agreed with the findings in the report. 05/18/2018 bone scan negative for metastatic disease 08/01/2018 CT chest abdomen pelvis showed no evidence of metastatic disease.  Brachytherapy seeds in the prostate gland no findings of local regional adenopathy or metastatic disease.  Chronic findings of advanced atherosclerotic calcifications of thoracic and abdominal aorta and branch vessels.  Including 3 vessel coronary  artery calcification.   ASSESSMENT & PLAN:  1. Malignant neoplasm of prostate ([place])   2. Androgen deprivation therapy   3. Hot flushes, perimenopausal   4. Pulmonary emphysema, unspecified emphysema type ([place])    # Prostate Cancer with biochemical recurrence, asymptomatic, castration sensitive.  Labs for this with patient.  PSA remains low at 0.73.  Responding to androgen deprivation therapy. Hot flush Is manageable.  Patient is now taking Effexor as instructed. Recommend proceeding with Lupron 30 mg injection today. Return to clinic for MD assessment with labs CBC, CMP, PSA in 4 months. Chronic shortness of breath secondary to COPD/emphysema.  He is not using any inhalers. Reports that he was using free sample inhaler which was given by PCP. He is not using it currently. Advise patient to follow up with PCP for further management.   All questions were answered. The patient knows to call the clinic with any problems questions or concerns.  Orders Placed This Encounter  Procedures  . CBC with Differential/Platelet    Standing Status:   Future    Standing Expiration Date:   02/06/2020  . Comprehensive metabolic panel    Standing Status:   Future    Standing Expiration Date:   02/06/2020  . PSA    Standing Status:   Future    Standing Expiration Date:   02/06/2020   Asir Server, MD, PhD Hematology Oncology [facility] at [facility] Pager- [phone] 02/05/2019

## 2019-02-06 ENCOUNTER — Ambulatory Visit: Payer: PPO | Admitting: Oncology

## 2019-02-06 ENCOUNTER — Ambulatory Visit: Payer: PPO

## 2019-03-02 NOTE — Progress Notes (Signed)
Patient: Sean Wood. Male    DOB: 1936/06/08   83 y.o.   MRN: [id number] Visit Date: 03/05/2019  Today's Provider: [name], PA   Chief Complaint  Patient presents with  . Dizziness   Subjective:     HPI   Patient states since he has been off balance for 4 days. Patient has had also been nauseous a few times. Patient states he is not having any dizziness when sitting, twisting, or bending. Patient states he is having some dizziness occasionally when he is looking up. Last episode was Thursday when he went to the kitchen to get a glass of water and off balance when he turned away from the sink.  Past Medical History:  Diagnosis Date  . Arthritis   . COPD (chronic obstructive pulmonary disease) ([place])   . Elevated PSA   . Hematuria, gross   . History of hiatal hernia   . HOH (hard of hearing)   . Kidney stone   . Prostate cancer ([place])   . Shortness of breath dyspnea    Past Surgical History:  Procedure Laterality Date  . [place]  . CYSTOSCOPY  06/24/2015   Procedure: CYSTOSCOPY;  Surgeon: Hollice Espy, MD;  Location: [facility] ORS;  Service: Urology;;  . HERNIA REPAIR     left and right inguinal hernia repair  . RADIOACTIVE SEED IMPLANT N/A 06/24/2015   Procedure: RADIOACTIVE SEED IMPLANT/BRACHYTHERAPY IMPLANT;  Surgeon: Hollice Espy, MD;  Location: [facility] ORS;  Service: Urology;  Laterality: N/A;   Family History  Problem Relation Age of Onset  . Cancer Brother   . Heart disease Brother   . Diabetes Brother   . Emphysema Brother   . Prostate cancer Brother   . Diabetes Sister   . Emphysema Mother   . Healthy Sister   . Healthy Sister   . Healthy Sister   . Diabetes Brother   . Diabetes Brother   . Diabetes Brother   . Healthy Brother    Allergies  Allergen Reactions  . Mevacor [Lovastatin] Other (See Comments)    "feel washed out"  . Niacin And Related Other (See Comments)    "hot feeling"  . Statins Other (See Comments)    "feel washed out"  . Aleve [Naproxen Sodium] Rash    Current Outpatient Medications:  .  Cholecalciferol (VITAMIN D-3) 1000 UNITS CAPS, Take 1 capsule by mouth daily. , Disp: , Rfl:  .  clobetasol (TEMOVATE) 0.05 % external solution, , Disp: , Rfl:  .  Magnesium 400 MG TABS, Take 1 tablet by mouth every other day. , Disp: , Rfl:  .  Red Yeast Rice 600 MG TABS, Take 1 tablet by mouth at bedtime. , Disp: , Rfl:  .  triamcinolone cream (KENALOG) 0.1 %, , Disp: , Rfl:  .  albuterol (PROAIR HFA) 108 (90 BASE) MCG/ACT inhaler, Reported on 05/24/2016, Disp: , Rfl:  .  Coenzyme Q10 (COQ-10) 100 MG CAPS, Take 100 mg by mouth daily. Reported on 02/06/2016, Disp: , Rfl:  .  fluticasone furoate-vilanterol (BREO ELLIPTA) 100-25 MCG/INH AEPB, Inhale 1 puff into the lungs daily. (Patient not taking: Reported on 02/05/2019), Disp: 14 each, Rfl: 0  Review of Systems  Constitutional: Negative for appetite change, chills and fever.  Respiratory: Negative for chest tightness, shortness of breath and wheezing.   Cardiovascular: Negative for chest pain and palpitations.  Gastrointestinal: Negative for abdominal pain, nausea and vomiting.   Social  History   Tobacco Use  . Smoking status: Former Smoker    Packs/day: 1.50    Types: Cigarettes    Last attempt to quit: 05/24/1981    Years since quitting: 37.8  . Smokeless tobacco: Current User    Types: Chew  Substance Use Topics  . Alcohol use: Not Currently    Alcohol/week: 0.0 standard drinks     Objective:   BP (!) 94/54 (BP Location: Right Arm, Patient Position: Sitting, Cuff Size: Large)   Pulse 71   Temp 98.3 F (36.8 C) (Oral)   Resp 16   Wt 151 lb (68.5 kg)   SpO2 97%   BMI 22.30 kg/m    BP Readings from Last 3 Encounters:  03/05/19 (!) 94/54  02/05/19 112/70  11/06/18 132/62   Wt Readings from Last 3 Encounters:  03/05/19 151 lb (68.5 kg)  02/05/19 150 lb 12.8 oz (68.4 kg)  11/06/18 150 lb 5 oz (68.2 kg)   Vitals:   03/05/19 0815   BP: (!) 94/54  Pulse: 71  Resp: 16  Temp: 98.3 F (36.8 C)  TempSrc: Oral  SpO2: 97%  Weight: 151 lb (68.5 kg)   Physical Exam Constitutional:      General: He is not in acute distress.    Appearance: He is well-developed.  HENT:     Head: Normocephalic and atraumatic.     Right Ear: Hearing and tympanic membrane normal.     Left Ear: Hearing and tympanic membrane normal.     Nose: Nose normal.  Eyes:     General: Lids are normal. No scleral icterus.       Right eye: No discharge.        Left eye: No discharge.     Conjunctiva/sclera: Conjunctivae normal.  Neck:     Musculoskeletal: Neck supple.  Cardiovascular:     Rate and Rhythm: Normal rate and regular rhythm.     Pulses: Normal pulses.     Heart sounds: Normal heart sounds.  Pulmonary:     Effort: Pulmonary effort is normal. No respiratory distress.  Abdominal:     General: Bowel sounds are normal.     Palpations: Abdomen is soft.  Musculoskeletal: Normal range of motion.  Lymphadenopathy:     Cervical: No cervical adenopathy.  Skin:    Findings: No lesion or rash.  Neurological:     Mental Status: He is alert and oriented to person, place, and time.  Psychiatric:        Speech: Speech normal.        Behavior: Behavior normal.        Thought Content: Thought content normal.       Assessment & Plan    1. Dizziness Had a lightheaded/dizzy episode on 03-01-19 in his kitchen and only lasted a couple minutes after sitting down. No recurrence. Denies cough, congestion, fever, sore throat, earache or GI up set. Past episode in the Fall of 2019 caused nausea and vomiting. No new medications. Will give meclizine and check labs. BP low today. Recommend increase in fluid intake. - CBC with Differential/Platelet - Comprehensive metabolic panel - meclizine (ANTIVERT) 12.5 MG tablet; Take 1 tablet (12.5 mg total) by mouth 3 (three) times daily as needed for dizziness.  Dispense: 30 tablet; Refill: 0  2. Malignant  neoplasm of prostate [facility]) Had last Leupron injection by Dr. Tasia Catchings (oncology) 02-05-19 with fatigue since that time. Recheck labs. - CBC with Differential/Platelet - Comprehensive metabolic panel  [name], PA  [facility]

## 2019-03-05 ENCOUNTER — Ambulatory Visit (INDEPENDENT_AMBULATORY_CARE_PROVIDER_SITE_OTHER): Payer: PPO | Admitting: Family Medicine

## 2019-03-05 ENCOUNTER — Other Ambulatory Visit: Payer: Self-pay

## 2019-03-05 ENCOUNTER — Encounter: Payer: Self-pay | Admitting: Family Medicine

## 2019-03-05 VITALS — BP 94/54 | HR 71 | Temp 98.3°F | Resp 16 | Wt 151.0 lb

## 2019-03-05 DIAGNOSIS — C61 Malignant neoplasm of prostate: Secondary | ICD-10-CM | POA: Diagnosis not present

## 2019-03-05 DIAGNOSIS — R42 Dizziness and giddiness: Secondary | ICD-10-CM

## 2019-03-05 MED ORDER — MECLIZINE HCL 12.5 MG PO TABS: 12.5000 mg | ORAL_TABLET | Freq: Three times a day (TID) | ORAL | 0 refills | Status: DC | PRN

## 2019-03-06 ENCOUNTER — Telehealth: Payer: Self-pay

## 2019-03-06 LAB — COMPREHENSIVE METABOLIC PANEL
A/G RATIO: 2.3 — AB (ref 1.2–2.2)
ALT: 7 IU/L (ref 0–44)
AST: 17 IU/L (ref 0–40)
Albumin: 4.3 g/dL (ref 3.7–4.7)
Alkaline Phosphatase: 77 IU/L (ref 39–117)
BUN/Creatinine Ratio: 16 (ref 10–24)
BUN: 16 mg/dL (ref 8–27)
Bilirubin Total: <0.2 mg/dL (ref 0.0–1.2)
CALCIUM: 9.5 mg/dL (ref 8.6–10.2)
CO2: 26 mmol/L (ref 20–29)
Chloride: 102 mmol/L (ref 96–106)
Creatinine, Ser: 0.97 mg/dL (ref 0.76–1.27)
GFR calc Af Amer: 85 mL/min/{1.73_m2} (ref >59)
GFR calc non Af Amer: 73 mL/min/{1.73_m2} (ref >59)
Globulin, Total: 1.9 g/dL (ref 1.5–4.5)
Glucose: 88 mg/dL (ref 65–99)
Potassium: 4.7 mmol/L (ref 3.5–5.2)
SODIUM: 144 mmol/L (ref 134–144)
Total Protein: 6.2 g/dL (ref 6.0–8.5)

## 2019-03-06 LAB — CBC WITH DIFFERENTIAL/PLATELET
Basophils Absolute: 0 10*3/uL (ref 0.0–0.2)
Basos: 1 %
EOS (ABSOLUTE): 0.1 10*3/uL (ref 0.0–0.4)
Eos: 2 %
Hematocrit: 41.6 % (ref 37.5–51.0)
Hemoglobin: 14.1 g/dL (ref 13.0–17.7)
IMMATURE GRANULOCYTES: 0 %
Immature Grans (Abs): 0 10*3/uL (ref 0.0–0.1)
Lymphocytes Absolute: 1.8 10*3/uL (ref 0.7–3.1)
Lymphs: 36 %
MCH: 28.7 pg (ref 26.6–33.0)
MCHC: 33.9 g/dL (ref 31.5–35.7)
MCV: 85 fL (ref 79–97)
Monocytes Absolute: 0.4 10*3/uL (ref 0.1–0.9)
Monocytes: 7 %
Neutrophils Absolute: 2.6 10*3/uL (ref 1.4–7.0)
Neutrophils: 54 %
Platelets: 293 10*3/uL (ref 150–450)
RBC: 4.91 x10E6/uL (ref 4.14–5.80)
RDW: 12.5 % (ref 11.6–15.4)
WBC: 4.9 10*3/uL (ref 3.4–10.8)

## 2019-03-06 NOTE — Telephone Encounter (Signed)
-----   Message from Margo Common, [place] sent at 03/06/2019  8:30 AM EDT ----- No sign of anemia, infection or dehydration on blood tests. Use Meclizine as needed for dizziness. Drink extra water in diet and recheck BP here or at home to be sure it does not drop further.

## 2019-03-06 NOTE — Telephone Encounter (Signed)
Patient has been advised. KW 

## 2019-03-12 ENCOUNTER — Telehealth: Payer: Self-pay | Admitting: Family Medicine

## 2019-03-12 NOTE — Telephone Encounter (Signed)
Patient was advised.  

## 2019-03-12 NOTE — Telephone Encounter (Signed)
Pt has been checking his blood pressure 127-60 running on average  CB#  [phone]  Thanks Con Memos

## 2019-03-12 NOTE — Telephone Encounter (Signed)
This BP is much better. Is dizziness better, also? If so, recheck as needed.

## 2019-04-11 ENCOUNTER — Ambulatory Visit: Payer: Self-pay

## 2019-06-04 ENCOUNTER — Ambulatory Visit: Payer: PPO | Admitting: Oncology

## 2019-06-04 ENCOUNTER — Ambulatory Visit: Payer: PPO

## 2019-06-04 ENCOUNTER — Other Ambulatory Visit: Payer: PPO

## 2019-06-06 ENCOUNTER — Inpatient Hospital Stay: Payer: PPO

## 2019-06-06 ENCOUNTER — Inpatient Hospital Stay: Payer: PPO | Attending: Oncology

## 2019-06-06 ENCOUNTER — Inpatient Hospital Stay (HOSPITAL_BASED_OUTPATIENT_CLINIC_OR_DEPARTMENT_OTHER): Payer: PPO | Admitting: Oncology

## 2019-06-06 ENCOUNTER — Other Ambulatory Visit: Payer: Self-pay

## 2019-06-06 ENCOUNTER — Encounter: Payer: Self-pay | Admitting: Oncology

## 2019-06-06 VITALS — BP 143/70 | HR 58 | Temp 96.7°F | Resp 18 | Wt 151.4 lb

## 2019-06-06 DIAGNOSIS — J449 Chronic obstructive pulmonary disease, unspecified: Secondary | ICD-10-CM | POA: Insufficient documentation

## 2019-06-06 DIAGNOSIS — M1990 Unspecified osteoarthritis, unspecified site: Secondary | ICD-10-CM

## 2019-06-06 DIAGNOSIS — Z79899 Other long term (current) drug therapy: Secondary | ICD-10-CM | POA: Insufficient documentation

## 2019-06-06 DIAGNOSIS — C61 Malignant neoplasm of prostate: Secondary | ICD-10-CM

## 2019-06-06 DIAGNOSIS — Z87891 Personal history of nicotine dependence: Secondary | ICD-10-CM | POA: Insufficient documentation

## 2019-06-06 DIAGNOSIS — Z7951 Long term (current) use of inhaled steroids: Secondary | ICD-10-CM | POA: Diagnosis not present

## 2019-06-06 DIAGNOSIS — Z79818 Long term (current) use of other agents affecting estrogen receptors and estrogen levels: Secondary | ICD-10-CM

## 2019-06-06 LAB — CBC WITH DIFFERENTIAL/PLATELET
Abs Immature Granulocytes: 0.01 10*3/uL (ref 0.00–0.07)
Basophils Absolute: 0 10*3/uL (ref 0.0–0.1)
Basophils Relative: 0 %
Eosinophils Absolute: 0.1 10*3/uL (ref 0.0–0.5)
Eosinophils Relative: 1 %
HCT: 43.6 % (ref 39.0–52.0)
Hemoglobin: 13.7 g/dL (ref 13.0–17.0)
Immature Granulocytes: 0 %
Lymphocytes Relative: 39 %
Lymphs Abs: 1.8 10*3/uL (ref 0.7–4.0)
MCH: 28 pg (ref 26.0–34.0)
MCHC: 31.4 g/dL (ref 30.0–36.0)
MCV: 89 fL (ref 80.0–100.0)
Monocytes Absolute: 0.3 10*3/uL (ref 0.1–1.0)
Monocytes Relative: 7 %
Neutro Abs: 2.4 10*3/uL (ref 1.7–7.7)
Neutrophils Relative %: 53 %
Platelets: 237 10*3/uL (ref 150–400)
RBC: 4.9 MIL/uL (ref 4.22–5.81)
RDW: 13.1 % (ref 11.5–15.5)
WBC: 4.6 10*3/uL (ref 4.0–10.5)
nRBC: 0 % (ref 0.0–0.2)

## 2019-06-06 LAB — COMPREHENSIVE METABOLIC PANEL
ALT: 13 U/L (ref 0–44)
AST: 19 U/L (ref 15–41)
Albumin: 4.1 g/dL (ref 3.5–5.0)
Alkaline Phosphatase: 66 U/L (ref 38–126)
Anion gap: 8 (ref 5–15)
BUN: 17 mg/dL (ref 8–23)
CO2: 32 mmol/L (ref 22–32)
Calcium: 9.1 mg/dL (ref 8.9–10.3)
Chloride: 103 mmol/L (ref 98–111)
Creatinine, Ser: 0.77 mg/dL (ref 0.61–1.24)
GFR calc Af Amer: >60 mL/min (ref >60)
GFR calc non Af Amer: >60 mL/min (ref >60)
Glucose, Bld: 111 mg/dL — ABNORMAL HIGH (ref 70–99)
Potassium: 4.4 mmol/L (ref 3.5–5.1)
Sodium: 143 mmol/L (ref 135–145)
Total Bilirubin: 0.3 mg/dL (ref 0.3–1.2)
Total Protein: 7 g/dL (ref 6.5–8.1)

## 2019-06-06 LAB — PSA: Prostatic Specific Antigen: 0.95 ng/mL (ref 0.00–4.00)

## 2019-06-06 MED ORDER — LEUPROLIDE ACETATE (4 MONTH) 30 MG IM KIT
30.0000 mg | PACK | Freq: Once | INTRAMUSCULAR | Status: AC
  Administered 2019-06-06: 30 mg via INTRAMUSCULAR
  Filled 2019-06-06: qty 30

## 2019-06-06 NOTE — Progress Notes (Signed)
Patient here for follow up. No concerns voiced.  °

## 2019-06-07 DIAGNOSIS — Z79818 Long term (current) use of other agents affecting estrogen receptors and estrogen levels: Secondary | ICD-10-CM | POA: Insufficient documentation

## 2019-06-07 NOTE — Progress Notes (Signed)
Hematology/Oncology Follow up note [facility] [phone] [phone]   Patient Care Team: Chrismon, Vickki Muff, PA as PCP - General (Physician Assistant) Garrel Ridgel, DPM as Consulting Physician (Podiatry) Noreene Filbert, MD as Referring Physician (Radiation Oncology) Idelle Leech, OD as Consulting Physician (Optometry) REASON FOR VISIT Follow up for treatment of biochemical recurrence of prostate cancer.  HISTORY OF PRESENTING ILLNESS:  Sean Okimoto. is a  83 y.o.  male with PMH listed below who was referred to me for evaluation of prostate cancer.   Cancer history dated back to 2009 when He was diagnosed with intermediate risk, Gleason 3+4, T1c prostate cancer in 2009. PSA at diagnosis was 6.1. IMRT was completed in August 2009. PSA nadir was 0.5. His PSA January 2013 was 0.8 and in January 2014 was 1.3. This was repeated in May 2014 and had increased to 1.8. He elected a repeat prostate biopsy in November 2014 after his PSA in October had increased to 2.2. This showed a focus of Gleason 3+3 adenocarcinoma from the right prostate involving less than 5%. He elected surveillance after discussing options. In May 2016, he was evaluated by Dr.Chrystal and he received salvage seed implantation with I-125. He subsequently follows with Dr.Chrystal for Lupron every 4 months since then. Achieve PSA nadir of 0.06 in 09/2016, and started to trend up and most recently trended up to 9.64.   # Lupron 30 mg on 05/10/2018, 06/14/2018 testosterone level was at 6  # Leupron 22.5mg  11/06/2018.  Delayed as patient reports feeling very fatigued and we discussed about holding Lupron.  26-month and restarted on 11/06/2018.  INTERVAL HISTORY Sean Demelo. is a 83 y.o. male who has above history reviewed by me present for follow-up of management of biochemical recurrence of prostate cancer, castration sensitive. Today patient reports feeling well at baseline.   Chronic fatigue. Manageable hot flash.  Chronic shortness of breath at baseline. No new complaints.  Denies any back pain.    Review of Systems  Constitutional: Positive for malaise/fatigue. Negative for chills, fever and weight loss.  HENT: Negative for congestion, ear discharge, ear pain, nosebleeds, sinus pain and sore throat.   Eyes: Negative for double vision, photophobia, pain, discharge and redness.  Respiratory: Positive for shortness of breath. Negative for cough, hemoptysis, sputum production and wheezing.   Cardiovascular: Negative for chest pain, palpitations, orthopnea, claudication and leg swelling.  Gastrointestinal: Negative for abdominal pain, blood in stool, constipation, diarrhea, heartburn, melena, nausea and vomiting.  Genitourinary: Negative for dysuria, flank pain, frequency and hematuria.  Musculoskeletal: Negative for back pain, myalgias and neck pain.  Skin: Negative for itching and rash.       Hot flush  Neurological: Negative for dizziness, tingling, tremors, focal weakness, weakness and headaches.  Endo/Heme/Allergies: Negative for environmental allergies. Does not bruise/bleed easily.  Psychiatric/Behavioral: Negative for depression and hallucinations. The patient is not nervous/anxious.     MEDICAL HISTORY:  Past Medical History:  Diagnosis Date  . Arthritis   . COPD (chronic obstructive pulmonary disease) ([place])   . Elevated PSA   . Hematuria, gross   . History of hiatal hernia   . HOH (hard of hearing)   . Kidney stone   . Prostate cancer ([place])   . Shortness of breath dyspnea     SURGICAL HISTORY: Past Surgical History:  Procedure Laterality Date  . [place]  . CYSTOSCOPY  06/24/2015   Procedure: CYSTOSCOPY;  Surgeon: Hollice Espy,  MD;  Location: [facility] ORS;  Service: Urology;;  . HERNIA REPAIR     left and right inguinal hernia repair  . RADIOACTIVE SEED IMPLANT N/A 06/24/2015   Procedure: RADIOACTIVE SEED IMPLANT/BRACHYTHERAPY  IMPLANT;  Surgeon: Hollice Espy, MD;  Location: [facility] ORS;  Service: Urology;  Laterality: N/A;    SOCIAL HISTORY: Social History   Socioeconomic History  . Marital status: Married    Spouse name: Not on file  . Number of children: 1  . Years of education: Not on file  . Highest education level: 8th grade  Occupational History  . 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 Food insecurity    Worry: Never true    Inability: Never true  . Transportation needs    Medical: No    Non-medical: No  Tobacco Use  . Smoking status: Former Smoker    Packs/day: 1.50    Types: Cigarettes    Quit date: 05/24/1981    Years since quitting: 38.0  . Smokeless tobacco: Current User    Types: Chew  Substance and Sexual Activity  . Alcohol use: Not Currently    Alcohol/week: 0.0 standard drinks  . Drug use: No  . Sexual activity: Not on file  Lifestyle  . Physical activity    Days per week: Not on file    Minutes per session: Not on file  . Stress: Only a little  Relationships  . Social Herbalist on phone: Not on file    Gets together: Not on file    Attends religious service: Not on file    Active member of club or organization: Not on file    Attends meetings of clubs or organizations: Not on file    Relationship status: Not on file  . Intimate partner violence    Fear of current or ex partner: Not on file    Emotionally abused: Not on file    Physically abused: Not on file    Forced sexual activity: Not on file  Other Topics Concern  . Not on file  Social History Narrative  . Not on file    FAMILY HISTORY: Family History  Problem Relation Age of Onset  . Cancer Brother   . Heart disease Brother   . Diabetes Brother   . Emphysema Brother   . Prostate cancer Brother   . Diabetes Sister   . Emphysema Mother   . Healthy Sister   . Healthy Sister   . Healthy Sister   . Diabetes Brother   . Diabetes Brother   .  Diabetes Brother   . Healthy Brother     ALLERGIES:  is allergic to mevacor [lovastatin]; niacin and related; statins; and aleve [naproxen sodium].  MEDICATIONS:  Current Outpatient Medications  Medication Sig Dispense Refill  . albuterol (PROAIR HFA) 108 (90 BASE) MCG/ACT inhaler Reported on 05/24/2016    . Cholecalciferol (VITAMIN D-3) 1000 UNITS CAPS Take 1 capsule by mouth daily.     . clobetasol (TEMOVATE) 0.05 % external solution     . Coenzyme Q10 (COQ-10) 100 MG CAPS Take 100 mg by mouth daily. Reported on 02/06/2016    . fluticasone furoate-vilanterol (BREO ELLIPTA) 100-25 MCG/INH AEPB Inhale 1 puff into the lungs daily. 14 each 0  . Magnesium 400 MG TABS Take 1 tablet by mouth every other day.     . meclizine (ANTIVERT) 12.5 MG tablet Take 1 tablet (12.5 mg total) by  mouth 3 (three) times daily as needed for dizziness. 30 tablet 0  . Red Yeast Rice 600 MG TABS Take 1 tablet by mouth at bedtime.     . triamcinolone cream (KENALOG) 0.1 %      No current facility-administered medications for this visit.      PHYSICAL EXAMINATION: ECOG PERFORMANCE STATUS: 1 - Symptomatic but completely ambulatory Vitals:   06/06/19 1400  BP: (!) 143/70  Pulse: (!) 58  Resp: 18  Temp: (!) 96.7 F (35.9 C)   Filed Weights   06/06/19 1400  Weight: 151 lb 6.4 oz (68.7 kg)    Physical Exam Constitutional:      General: He is not in acute distress. HENT:     Head: Normocephalic and atraumatic.  Eyes:     General: No scleral icterus.    Conjunctiva/sclera: Conjunctivae normal.     Pupils: Pupils are equal, round, and reactive to light.  Neck:     Musculoskeletal: Normal range of motion and neck supple.  Cardiovascular:     Rate and Rhythm: Normal rate and regular rhythm.     Heart sounds: Normal heart sounds.  Pulmonary:     Effort: Pulmonary effort is normal. No respiratory distress.     Breath sounds: No wheezing or rales.  Chest:     Chest wall: No tenderness.  Abdominal:      General: Bowel sounds are normal. There is no distension.     Palpations: Abdomen is soft. There is no mass.     Tenderness: There is no abdominal tenderness.  Musculoskeletal: Normal range of motion.        General: No deformity.  Lymphadenopathy:     Cervical: No cervical adenopathy.  Skin:    General: Skin is warm and dry.     Findings: No erythema or rash.  Neurological:     Mental Status: He is alert and oriented to person, place, and time.     Cranial Nerves: No cranial nerve deficit.     Coordination: Coordination normal.  Psychiatric:        Behavior: Behavior normal.        Thought Content: Thought content normal.      LABORATORY DATA:  I have reviewed the data as listed Lab Results  Component Value Date   WBC 4.6 06/06/2019   HGB 13.7 06/06/2019   HCT 43.6 06/06/2019   MCV 89.0 06/06/2019   PLT 237 06/06/2019   Recent Labs    02/02/19 1025 03/05/19 0913 06/06/19 1329  NA 142 144 143  K 4.3 4.7 4.4  CL 103 102 103  CO2 32 26 32  GLUCOSE 97 88 111*  BUN 20 16 17   CREATININE 0.97 0.97 0.77  CALCIUM 9.6 9.5 9.1  GFRNONAA >60 73 >60  GFRAA >60 85 >60  PROT 7.2 6.2 7.0  ALBUMIN 4.2 4.3 4.1  AST 21 17 19   ALT 11 7 13   ALKPHOS 58 77 66  BILITOT 0.7 <0.2 0.3   RADIOGRAPHIC STUDIES: I have personally reviewed the radiological images as listed and agreed with the findings in the report. 05/18/2018 bone scan negative for metastatic disease 08/01/2018 CT chest abdomen pelvis showed no evidence of metastatic disease.  Brachytherapy seeds in the prostate gland no findings of local regional adenopathy or metastatic disease.  Chronic findings of advanced atherosclerotic calcifications of thoracic and abdominal aorta and branch vessels.  Including 3 vessel coronary artery calcification.   ASSESSMENT & PLAN:  1. Malignant neoplasm of  prostate ([place])   2. Encounter for monitoring androgen deprivation therapy    # Prostate Cancer with biochemical recurrence,  asymptomatic, castration sensitive.  Overall tolerating androgen deprivation PSA nadir 0.68.  Slightly trending up. Most recent PSA 0.95. Continue monitor. Recommend Lupron 30 mg injection today. Recommend patient to follow-up in the clinic in 4 months  Recommend patient to follow-up with provider for other chronic problems. All questions were answered. The patient knows to call the clinic with any problems questions or concerns. We spent sufficient time to discuss many aspect of care, questions were answered to patient's satisfaction. Total face to face encounter time for this patient visit was 15 min. >50% of the time was  spent in counseling and coordination of care.   Orders Placed This Encounter  Procedures  . CBC with Differential/Platelet    Standing Status:   Future    Standing Expiration Date:   06/06/2020  . Comprehensive metabolic panel    Standing Status:   Future    Standing Expiration Date:   06/06/2020  . PSA    Standing Status:   Future    Standing Expiration Date:   06/06/2020   Trai Server, MD, PhD Hematology Oncology [facility] at [facility] Pager- [phone] 06/07/2019

## 2019-09-06 ENCOUNTER — Telehealth: Payer: Self-pay

## 2019-09-06 NOTE — Telephone Encounter (Signed)
Spoke with wife who states she will have pt CB once he gets home.

## 2019-09-11 NOTE — Telephone Encounter (Signed)
Scheduled telephonic AWV for 09/19/19 @ 8:40 AM.

## 2019-09-18 NOTE — Progress Notes (Addendum)
Subjective:   Sean Hellen. is a 83 y.o. male who presents for Medicare Annual/Subsequent preventive examination.    This visit is being conducted through telemedicine due to the COVID-19 pandemic. This patient has given me verbal consent via doximity to conduct this visit, patient states they are participating from their home address. Some vital signs may be absent or patient reported.    Patient identification: identified by name, DOB, and current address  Review of Systems:  N/A  Cardiac Risk Factors include: advanced age (>19men, >37 women);male gender     Objective:    Vitals: There were no vitals taken for this visit.  There is no height or weight on file to calculate BMI. Unable to obtain vitals due to visit being conducted via telephonically.   Advanced Directives 09/19/2019 06/06/2019 02/05/2019 11/06/2018 09/11/2018 08/14/2018 07/18/2018  Does Patient Have a Medical Advance Directive? Yes Yes Yes Yes Yes Yes Yes  Type of Advance Directive Living will Living will;Healthcare Power of Attorney Living will;Healthcare Power of Attorney - - - -  Does patient want to make changes to medical advance directive? - - - - - - -  Copy of [place] in Chart? - - No - copy requested - - - -  Would patient like information on creating a medical advance directive? - - - - - - -    Tobacco Social History   Tobacco Use  Smoking Status Former Smoker  . Packs/day: 1.50  . Types: Cigarettes  . Quit date: 05/24/1981  . Years since quitting: 38.3  Smokeless Tobacco Current User  . Types: Chew     Ready to quit: Not Answered Counseling given: Not Answered   Clinical Intake:  Pre-visit preparation completed: Yes  Pain : No/denies pain Pain Score: 0-No pain     Nutritional Risks: None Diabetes: No  How often do you need to have someone help you when you read instructions, pamphlets, or other written materials from your doctor or pharmacy?: 1 - Never   Interpreter Needed?: No  Information entered by :: [facility], LPN  Past Medical History:  Diagnosis Date  . Arthritis   . COPD (chronic obstructive pulmonary disease) ([place])   . Elevated PSA   . Hematuria, gross   . History of hiatal hernia   . HOH (hard of hearing)   . Kidney stone   . Prostate cancer ([place])   . Shortness of breath dyspnea    Past Surgical History:  Procedure Laterality Date  . [place]  . CYSTOSCOPY  06/24/2015   Procedure: CYSTOSCOPY;  Surgeon: Hollice Espy, MD;  Location: [facility] ORS;  Service: Urology;;  . HERNIA REPAIR     left and right inguinal hernia repair  . RADIOACTIVE SEED IMPLANT N/A 06/24/2015   Procedure: RADIOACTIVE SEED IMPLANT/BRACHYTHERAPY IMPLANT;  Surgeon: Hollice Espy, MD;  Location: [facility] ORS;  Service: Urology;  Laterality: N/A;   Family History  Problem Relation Age of Onset  . Cancer Brother   . Heart disease Brother   . Diabetes Brother   . Emphysema Brother   . Prostate cancer Brother   . Diabetes Sister   . Emphysema Mother   . Healthy Sister   . Healthy Sister   . Healthy Sister   . Diabetes Brother   . Diabetes Brother   . Diabetes Brother   . Healthy Brother    Social History   Socioeconomic History  . Marital status: Married  Spouse name: Not on file  . Number of children: 1  . Years of education: Not on file  . Highest education level: 8th grade  Occupational History  . 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 Food insecurity    Worry: Never true    Inability: Never true  . Transportation needs    Medical: No    Non-medical: No  Tobacco Use  . Smoking status: Former Smoker    Packs/day: 1.50    Types: Cigarettes    Quit date: 05/24/1981    Years since quitting: 38.3  . Smokeless tobacco: Current User    Types: Chew  Substance and Sexual Activity  . Alcohol use: Not Currently    Alcohol/week: 0.0 standard drinks  . Drug use: No  . Sexual  activity: Not on file  Lifestyle  . Physical activity    Days per week: 0 days    Minutes per session: 0 min  . Stress: Not at all  Relationships  . Social Herbalist on phone: Patient refused    Gets together: Patient refused    Attends religious service: Patient refused    Active member of club or organization: Patient refused    Attends meetings of clubs or organizations: Patient refused    Relationship status: Patient refused  Other Topics Concern  . Not on file  Social History Narrative  . Not on file    Outpatient Encounter Medications as of 09/19/2019  Medication Sig  . albuterol (PROAIR HFA) 108 (90 BASE) MCG/ACT inhaler Reported on 05/24/2016  . Cholecalciferol (VITAMIN D-3) 1000 UNITS CAPS Take 1 capsule by mouth daily.   . clobetasol (TEMOVATE) 0.05 % external solution Apply 1 application topically 2 (two) times daily.   . Coenzyme Q10 (COQ-10) 100 MG CAPS Take 100 mg by mouth daily. Reported on 02/06/2016  . fluticasone furoate-vilanterol (BREO ELLIPTA) 100-25 MCG/INH AEPB Inhale 1 puff into the lungs daily.  . Magnesium 400 MG TABS Take 1 tablet by mouth every other day.   . meclizine (ANTIVERT) 12.5 MG tablet Take 1 tablet (12.5 mg total) by mouth 3 (three) times daily as needed for dizziness.  . Red Yeast Rice 600 MG TABS Take 1 tablet by mouth at bedtime.   . triamcinolone cream (KENALOG) 0.1 %    No facility-administered encounter medications on file as of 09/19/2019.     Activities of Daily Living In your present state of health, do you have any difficulty performing the following activities: 09/19/2019  Hearing? Y  Comment Unable to hear out of the left ear. Does not wear hearing aids.  Vision? N  Difficulty concentrating or making decisions? N  Walking or climbing stairs? N  Dressing or bathing? N  Doing errands, shopping? N  Preparing Food and eating ? N  Using the Toilet? N  In the past six months, have you accidently leaked urine? N  Do you  have problems with loss of bowel control? N  Managing your Medications? N  Managing your Finances? N  Housekeeping or managing your Housekeeping? N  Some recent data might be hidden    Patient Care Team: Chrismon, Vickki Muff, PA as PCP - General (Physician Assistant) Garrel Ridgel, DPM as Consulting Physician (Podiatry) Noreene Filbert, MD as Referring Physician (Radiation Oncology) Idelle Leech, OD as Consulting Physician (Optometry) Taim Server, MD as Consulting Physician (Oncology)   Assessment:   This is a routine  wellness examination for Sean Wood.  Exercise Activities and Dietary recommendations Current Exercise Habits: The patient does not participate in regular exercise at present, Exercise limited by: None identified  Goals    . DIET - WATER INTAKE     Continue drinking 6-8 glasses of water a day.        Fall Risk: Fall Risk  09/19/2019 10/03/2018 05/05/2018 04/06/2018 11/23/2016  Falls in the past year? 0 No No No Yes  Number falls in past yr: 0 - - - 1  Injury with Fall? 0 - - - No    FALL RISK PREVENTION PERTAINING TO THE HOME:  Any stairs in or around the home? Yes  If so, are there any without handrails? No   Home free of loose throw rugs in walkways, pet beds, electrical cords, etc? Yes  Adequate lighting in your home to reduce risk of falls? Yes   ASSISTIVE DEVICES UTILIZED TO PREVENT FALLS:  Life alert? No  Use of a cane, walker or w/c? No  Grab bars in the bathroom? No  Shower chair or bench in shower? Yes  Elevated toilet seat or a handicapped toilet? Yes   TIMED UP AND GO:  Was the test performed? No .    Depression Screen PHQ 2/9 Scores 09/19/2019 10/03/2018 05/05/2018 04/06/2018  PHQ - 2 Score 0 0 0 0    Cognitive Function     6CIT Screen 09/19/2019 11/23/2016  What Year? 0 points 0 points  What month? 0 points 0 points  What time? 0 points 0 points  Count back from 20 0 points 2 points  Months in reverse 0 points 0 points  Repeat phrase 2  points 4 points  Total Score 2 6    Immunization History  Administered Date(s) Administered  . Influenza Split 09/12/2010  . Influenza, High Dose Seasonal PF 11/23/2016, 10/12/2017, 08/29/2018  . Pneumococcal Conjugate-13 11/23/2016  . Pneumococcal Polysaccharide-23 04/11/2012  . Tdap 10/14/2011    Qualifies for Shingles Vaccine? Yes . Due for Shingrix. Education has been provided regarding the importance of this vaccine. Pt has been advised to call insurance company to determine out of pocket expense. Advised may also receive vaccine at local pharmacy or Health Dept. Verbalized acceptance and understanding.  Tdap: Up to date  Flu Vaccine: Due for Flu vaccine. Does the patient want to receive this vaccine today?  No .   Pneumococcal Vaccine: Completed series  Screening Tests Health Maintenance  Topic Date Due  . INFLUENZA VACCINE  07/21/2019  . TETANUS/TDAP  10/13/2021  . PNA vac Low Risk Adult  Completed   Cancer Screenings:  Colorectal Screening: No longer required.   Lung Cancer Screening: (Low Dose CT Chest recommended if Age 57-80 years, 30 pack-year currently smoking OR have quit w/in 15years.) does not qualify.   Additional Screening:   Vision Screening: Recommended annual ophthalmology exams for early detection of glaucoma and other disorders of the eye.  Dental Screening: Recommended annual dental exams for proper oral hygiene  Community Resource Referral:  CRR required this visit?  No        Plan:  I have personally reviewed and addressed the Medicare Annual Wellness questionnaire and have noted the following in the patient's chart:  A. Medical and social history B. Use of alcohol, tobacco or illicit drugs  C. Current medications and supplements D. Functional ability and status E.  Nutritional status F.  Physical activity G. Advance directives H. List of other physicians I.  Hospitalizations, surgeries,  and ER visits in previous 12 months J.  [place] such as hearing and vision if needed, cognitive and depression L. Referrals and appointments   In addition, I have reviewed and discussed with patient certain preventive protocols, quality metrics, and best practice recommendations. A written personalized care plan for preventive services as well as general preventive health recommendations were provided to patient.   Glendora Score, LPN  [id number] Nurse Health Advisor   Nurse Notes: Influenza vaccine due. Pt scheduled an apt for 09/26/19 to receive this vaccine.   Reviewed Nurse Health Advisor's screening and recommendations. Agree with documentation and plan.

## 2019-09-19 ENCOUNTER — Ambulatory Visit (INDEPENDENT_AMBULATORY_CARE_PROVIDER_SITE_OTHER): Payer: PPO

## 2019-09-19 ENCOUNTER — Other Ambulatory Visit: Payer: Self-pay

## 2019-09-19 DIAGNOSIS — Z0000 Encounter for general adult medical examination without abnormal findings: Secondary | ICD-10-CM | POA: Diagnosis not present

## 2019-09-19 NOTE — Patient Instructions (Signed)
Mr. Sean Wood , Thank you for taking time to come for your Medicare Wellness Visit. I appreciate your ongoing commitment to your health goals. Please review the following plan we discussed and let me know if I can assist you in the future.   Screening recommendations/referrals: Colonoscopy: No longer required.  Recommended yearly ophthalmology/optometry visit for glaucoma screening and checkup Recommended yearly dental visit for hygiene and checkup  Vaccinations: Influenza vaccine: Currently due Pneumococcal vaccine: Completed series Tdap vaccine: Up to date, due 09/2021 Shingles vaccine: Pt declines today.     Advanced directives: Please bring a copy of your POA (Power of Attorney) and/or Living Will to your next appointment.   Conditions/risks identified: Continue to increase water intake to 6-8 8 oz glasses a day.   Next appointment: 09/26/19 @ 4:00 PM for a flu shot. Declined scheduling a follow up with PCP or an AWV for 2021 at this time.   Preventive Care 45 Years and Older, Male Preventive care refers to lifestyle choices and visits with your health care provider that can promote health and wellness. What does preventive care include?  A yearly physical exam. This is also called an annual well check.  Dental exams once or twice a year.  Routine eye exams. Ask your health care provider how often you should have your eyes checked.  Personal lifestyle choices, including:  Daily care of your teeth and gums.  Regular physical activity.  Eating a healthy diet.  Avoiding tobacco and drug use.  Limiting alcohol use.  Practicing safe sex.  Taking low doses of aspirin every day.  Taking vitamin and mineral supplements as recommended by your health care provider. What happens during an annual well check? The services and screenings done by your health care provider during your annual well check will depend on your age, overall health, lifestyle risk factors, and family  history of disease. Counseling  Your health care provider may ask you questions about your:  Alcohol use.  Tobacco use.  Drug use.  Emotional well-being.  Home and relationship well-being.  Sexual activity.  Eating habits.  History of falls.  Memory and ability to understand (cognition).  Work and work Statistician. Screening  You may have the following tests or measurements:  Height, weight, and BMI.  Blood pressure.  Lipid and cholesterol levels. These may be checked every 5 years, or more frequently if you are over 64 years old.  Skin check.  Lung cancer screening. You may have this screening every year starting at age 64 if you have a 30-pack-year history of smoking and currently smoke or have quit within the past 15 years.  Fecal occult blood test (FOBT) of the stool. You may have this test every year starting at age 36.  Flexible sigmoidoscopy or colonoscopy. You may have a sigmoidoscopy every 5 years or a colonoscopy every 10 years starting at age 42.  Prostate cancer screening. Recommendations will vary depending on your family history and other risks.  Hepatitis C blood test.  Hepatitis B blood test.  Sexually transmitted disease (STD) testing.  Diabetes screening. This is done by checking your blood sugar (glucose) after you have not eaten for a while (fasting). You may have this done every 1-3 years.  Abdominal aortic aneurysm (AAA) screening. You may need this if you are a current or former smoker.  Osteoporosis. You may be screened starting at age 76 if you are at high risk. Talk with your health care provider about your test results, treatment options,  and if necessary, the need for more tests. Vaccines  Your health care provider may recommend certain vaccines, such as:  Influenza vaccine. This is recommended every year.  Tetanus, diphtheria, and acellular pertussis (Tdap, Td) vaccine. You may need a Td booster every 10 years.  Zoster vaccine.  You may need this after age 8.  Pneumococcal 13-valent conjugate (PCV13) vaccine. One dose is recommended after age 72.  Pneumococcal polysaccharide (PPSV23) vaccine. One dose is recommended after age 4. Talk to your health care provider about which screenings and vaccines you need and how often you need them. This information is not intended to replace advice given to you by your health care provider. Make sure you discuss any questions you have with your health care provider. Document Released: 01/02/2016 Document Revised: 08/25/2016 Document Reviewed: 10/07/2015 Elsevier Interactive Patient Education  2017 [place] Prevention in the Home Falls can cause injuries. They can happen to people of all ages. There are many things you can do to make your home safe and to help prevent falls. What can I do on the outside of my home?  Regularly fix the edges of walkways and driveways and fix any cracks.  Remove anything that might make you trip as you walk through a door, such as a raised step or threshold.  Trim any bushes or trees on the path to your home.  Use bright outdoor lighting.  Clear any walking paths of anything that might make someone trip, such as rocks or tools.  Regularly check to see if handrails are loose or broken. Make sure that both sides of any steps have handrails.  Any raised decks and porches should have guardrails on the edges.  Have any leaves, snow, or ice cleared regularly.  Use sand or salt on walking paths during winter.  Clean up any spills in your garage right away. This includes oil or grease spills. What can I do in the bathroom?  Use night lights.  Install grab bars by the toilet and in the tub and shower. Do not use towel bars as grab bars.  Use non-skid mats or decals in the tub or shower.  If you need to sit down in the shower, use a plastic, non-slip stool.  Keep the floor dry. Clean up any water that spills on the floor as soon  as it happens.  Remove soap buildup in the tub or shower regularly.  Attach bath mats securely with double-sided non-slip rug tape.  Do not have throw rugs and other things on the floor that can make you trip. What can I do in the bedroom?  Use night lights.  Make sure that you have a light by your bed that is easy to reach.  Do not use any sheets or blankets that are too big for your bed. They should not hang down onto the floor.  Have a firm chair that has side arms. You can use this for support while you get dressed.  Do not have throw rugs and other things on the floor that can make you trip. What can I do in the kitchen?  Clean up any spills right away.  Avoid walking on wet floors.  Keep items that you use a lot in easy-to-reach places.  If you need to reach something above you, use a strong step stool that has a grab bar.  Keep electrical cords out of the way.  Do not use floor polish or wax that makes floors slippery. If  you must use wax, use non-skid floor wax.  Do not have throw rugs and other things on the floor that can make you trip. What can I do with my stairs?  Do not leave any items on the stairs.  Make sure that there are handrails on both sides of the stairs and use them. Fix handrails that are broken or loose. Make sure that handrails are as long as the stairways.  Check any carpeting to make sure that it is firmly attached to the stairs. Fix any carpet that is loose or worn.  Avoid having throw rugs at the top or bottom of the stairs. If you do have throw rugs, attach them to the floor with carpet tape.  Make sure that you have a light switch at the top of the stairs and the bottom of the stairs. If you do not have them, ask someone to add them for you. What else can I do to help prevent falls?  Wear shoes that:  Do not have high heels.  Have rubber bottoms.  Are comfortable and fit you well.  Are closed at the toe. Do not wear sandals.  If  you use a stepladder:  Make sure that it is fully opened. Do not climb a closed stepladder.  Make sure that both sides of the stepladder are locked into place.  Ask someone to hold it for you, if possible.  Clearly mark and make sure that you can see:  Any grab bars or handrails.  First and last steps.  Where the edge of each step is.  Use tools that help you move around (mobility aids) if they are needed. These include:  Canes.  Walkers.  Scooters.  Crutches.  Turn on the lights when you go into a dark area. Replace any light bulbs as soon as they burn out.  Set up your furniture so you have a clear path. Avoid moving your furniture around.  If any of your floors are uneven, fix them.  If there are any pets around you, be aware of where they are.  Review your medicines with your doctor. Some medicines can make you feel dizzy. This can increase your chance of falling. Ask your doctor what other things that you can do to help prevent falls. This information is not intended to replace advice given to you by your health care provider. Make sure you discuss any questions you have with your health care provider. Document Released: 10/02/2009 Document Revised: 05/13/2016 Document Reviewed: 01/10/2015 Elsevier Interactive Patient Education  2017 [employer].

## 2019-09-26 ENCOUNTER — Ambulatory Visit (INDEPENDENT_AMBULATORY_CARE_PROVIDER_SITE_OTHER): Payer: PPO

## 2019-09-26 ENCOUNTER — Other Ambulatory Visit: Payer: Self-pay

## 2019-09-26 DIAGNOSIS — Z23 Encounter for immunization: Secondary | ICD-10-CM | POA: Diagnosis not present

## 2019-10-05 ENCOUNTER — Inpatient Hospital Stay: Payer: PPO

## 2019-10-05 ENCOUNTER — Other Ambulatory Visit: Payer: Self-pay

## 2019-10-05 ENCOUNTER — Inpatient Hospital Stay: Payer: PPO | Attending: Oncology

## 2019-10-05 ENCOUNTER — Inpatient Hospital Stay (HOSPITAL_BASED_OUTPATIENT_CLINIC_OR_DEPARTMENT_OTHER): Payer: PPO | Admitting: Oncology

## 2019-10-05 ENCOUNTER — Encounter: Payer: Self-pay | Admitting: Oncology

## 2019-10-05 ENCOUNTER — Other Ambulatory Visit: Payer: Self-pay | Admitting: *Deleted

## 2019-10-05 VITALS — BP 106/72 | HR 62 | Temp 96.4°F | Resp 18 | Wt 150.5 lb

## 2019-10-05 DIAGNOSIS — C61 Malignant neoplasm of prostate: Secondary | ICD-10-CM | POA: Insufficient documentation

## 2019-10-05 DIAGNOSIS — Z79818 Long term (current) use of other agents affecting estrogen receptors and estrogen levels: Secondary | ICD-10-CM

## 2019-10-05 DIAGNOSIS — R5383 Other fatigue: Secondary | ICD-10-CM | POA: Diagnosis not present

## 2019-10-05 DIAGNOSIS — Z5111 Encounter for antineoplastic chemotherapy: Secondary | ICD-10-CM | POA: Diagnosis not present

## 2019-10-05 DIAGNOSIS — Z79899 Other long term (current) drug therapy: Secondary | ICD-10-CM | POA: Insufficient documentation

## 2019-10-05 LAB — CBC WITH DIFFERENTIAL/PLATELET
Abs Immature Granulocytes: 0.42 10*3/uL — ABNORMAL HIGH (ref 0.00–0.07)
Basophils Absolute: 0 10*3/uL (ref 0.0–0.1)
Basophils Relative: 0 %
Eosinophils Absolute: 0 10*3/uL (ref 0.0–0.5)
Eosinophils Relative: 1 %
HCT: 42.3 % (ref 39.0–52.0)
Hemoglobin: 13.7 g/dL (ref 13.0–17.0)
Immature Granulocytes: 8 %
Lymphocytes Relative: 46 %
Lymphs Abs: 2.5 10*3/uL (ref 0.7–4.0)
MCH: 28.7 pg (ref 26.0–34.0)
MCHC: 32.4 g/dL (ref 30.0–36.0)
MCV: 88.7 fL (ref 80.0–100.0)
Monocytes Absolute: 0.5 10*3/uL (ref 0.1–1.0)
Monocytes Relative: 9 %
Neutro Abs: 2 10*3/uL (ref 1.7–7.7)
Neutrophils Relative %: 36 %
Platelets: 248 10*3/uL (ref 150–400)
RBC: 4.77 MIL/uL (ref 4.22–5.81)
RDW: 12.9 % (ref 11.5–15.5)
Smear Review: NORMAL
WBC: 5.5 10*3/uL (ref 4.0–10.5)
nRBC: 0 % (ref 0.0–0.2)

## 2019-10-05 LAB — COMPREHENSIVE METABOLIC PANEL
ALT: 16 U/L (ref 0–44)
AST: 24 U/L (ref 15–41)
Albumin: 4.2 g/dL (ref 3.5–5.0)
Alkaline Phosphatase: 74 U/L (ref 38–126)
Anion gap: 8 (ref 5–15)
BUN: 19 mg/dL (ref 8–23)
CO2: 30 mmol/L (ref 22–32)
Calcium: 9.5 mg/dL (ref 8.9–10.3)
Chloride: 101 mmol/L (ref 98–111)
Creatinine, Ser: 0.89 mg/dL (ref 0.61–1.24)
GFR calc Af Amer: >60 mL/min (ref >60)
GFR calc non Af Amer: >60 mL/min (ref >60)
Glucose, Bld: 102 mg/dL — ABNORMAL HIGH (ref 70–99)
Potassium: 5.1 mmol/L (ref 3.5–5.1)
Sodium: 139 mmol/L (ref 135–145)
Total Bilirubin: 0.5 mg/dL (ref 0.3–1.2)
Total Protein: 7.2 g/dL (ref 6.5–8.1)

## 2019-10-05 LAB — TSH: TSH: 0.951 u[IU]/mL (ref 0.350–4.500)

## 2019-10-05 LAB — PSA: Prostatic Specific Antigen: 1.2 ng/mL (ref 0.00–4.00)

## 2019-10-05 MED ORDER — LEUPROLIDE ACETATE (4 MONTH) 30 MG IM KIT
30.0000 mg | PACK | Freq: Once | INTRAMUSCULAR | Status: AC
  Administered 2019-10-05: 11:00:00 30 mg via INTRAMUSCULAR
  Filled 2019-10-05: qty 30

## 2019-10-05 NOTE — Progress Notes (Signed)
Patient here for follow up and Lupron injection. No concerns voiced.

## 2019-10-05 NOTE — Progress Notes (Signed)
Hematology/Oncology Follow up note [facility] Telephone:(336) [phone] Fax:(336) [phone]   Patient Care Team: Chrismon, Vickki Muff, PA as PCP - General (Physician Assistant) Garrel Ridgel, DPM as Consulting Physician (Podiatry) Noreene Filbert, MD as Referring Physician (Radiation Oncology) Idelle Leech, OD as Consulting Physician (Optometry) Qualyn Server, MD as Consulting Physician (Oncology) REASON FOR VISIT Follow up for treatment of biochemical recurrence of prostate cancer.  HISTORY OF PRESENTING ILLNESS:  Sean Wood. is a  83 y.o.  male with PMH listed below who was referred to me for evaluation of prostate cancer.   Cancer history dated back to 2009 when He was diagnosed with intermediate risk, Gleason 3+4, T1c prostate cancer in 2009. PSA at diagnosis was 6.1. IMRT was completed in August 2009. PSA nadir was 0.5. His PSA January 2013 was 0.8 and in January 2014 was 1.3. This was repeated in May 2014 and had increased to 1.8. He elected a repeat prostate biopsy in November 2014 after his PSA in October had increased to 2.2. This showed a focus of Gleason 3+3 adenocarcinoma from the right prostate involving less than 5%. He elected surveillance after discussing options. In May 2016, he was evaluated by Dr.Chrystal and he received salvage seed implantation with I-125. He subsequently follows with Dr.Chrystal for Lupron every 4 months since then. Achieve PSA nadir of 0.06 in 09/2016, and started to trend up and most recently trended up to 9.64.   # Lupron 30 mg on 05/10/2018, 06/14/2018 testosterone level was at 6  # Leupron 22.5mg  11/06/2018.  Delayed as patient reports feeling very fatigued and we discussed about holding Lupron.  40-month and restarted on 11/06/2018.  INTERVAL HISTORY Sean Wood. is a 84 y.o. male who has above history reviewed by me present for follow-up of management of biochemical recurrence of prostate cancer, castration sensitive.  Patient reports feeling well at baseline. He continues to have chronic fatigue, lack of energy. Manageable hot flash.  Denies any urinary symptoms. Chronic shortness of breath at baseline He denies any new bone pain. No new complaints. appetite is good and weight is stable..    Review of Systems  Constitutional: Positive for malaise/fatigue. Negative for chills, fever and weight loss.  HENT: Negative for congestion, ear discharge, ear pain, nosebleeds, sinus pain and sore throat.   Eyes: Negative for double vision, photophobia, pain, discharge and redness.  Respiratory: Positive for shortness of breath. Negative for cough, hemoptysis, sputum production and wheezing.   Cardiovascular: Negative for chest pain, palpitations, orthopnea, claudication and leg swelling.  Gastrointestinal: Negative for abdominal pain, blood in stool, constipation, diarrhea, heartburn, melena, nausea and vomiting.  Genitourinary: Negative for dysuria, flank pain, frequency and hematuria.  Musculoskeletal: Negative for back pain, myalgias and neck pain.  Skin: Negative for itching and rash.       Hot flash  Neurological: Negative for dizziness, tingling, tremors, focal weakness, weakness and headaches.  Endo/Heme/Allergies: Negative for environmental allergies. Does not bruise/bleed easily.  Psychiatric/Behavioral: Negative for depression and hallucinations. The patient is not nervous/anxious.     MEDICAL HISTORY:  Past Medical History:  Diagnosis Date  . Arthritis   . COPD (chronic obstructive pulmonary disease) ([place])   . Elevated PSA   . Hematuria, gross   . History of hiatal hernia   . HOH (hard of hearing)   . Kidney stone   . Prostate cancer ([place])   . Shortness of breath dyspnea     SURGICAL HISTORY: Past Surgical History:  Procedure Laterality Date  . [place]  . CYSTOSCOPY  06/24/2015   Procedure: CYSTOSCOPY;  Surgeon: Hollice Espy, MD;  Location: [facility] ORS;  Service: Urology;;   . HERNIA REPAIR     left and right inguinal hernia repair  . RADIOACTIVE SEED IMPLANT N/A 06/24/2015   Procedure: RADIOACTIVE SEED IMPLANT/BRACHYTHERAPY IMPLANT;  Surgeon: Hollice Espy, MD;  Location: [facility] ORS;  Service: Urology;  Laterality: N/A;    SOCIAL HISTORY: Social History   Socioeconomic History  . Marital status: Married    Spouse name: Not on file  . Number of children: 1  . Years of education: Not on file  . Highest education level: 8th grade  Occupational History  . 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 Food insecurity    Worry: Never true    Inability: Never true  . Transportation needs    Medical: No    Non-medical: No  Tobacco Use  . Smoking status: Former Smoker    Packs/day: 1.50    Types: Cigarettes    Quit date: 05/24/1981    Years since quitting: 38.3  . Smokeless tobacco: Current User    Types: Chew  Substance and Sexual Activity  . Alcohol use: Not Currently    Alcohol/week: 0.0 standard drinks  . Drug use: No  . Sexual activity: Not on file  Lifestyle  . Physical activity    Days per week: 0 days    Minutes per session: 0 min  . Stress: Not at all  Relationships  . Social Herbalist on phone: Patient refused    Gets together: Patient refused    Attends religious service: Patient refused    Active member of club or organization: Patient refused    Attends meetings of clubs or organizations: Patient refused    Relationship status: Patient refused  . Intimate partner violence    Fear of current or ex partner: Patient refused    Emotionally abused: Patient refused    Physically abused: Patient refused    Forced sexual activity: Patient refused  Other Topics Concern  . Not on file  Social History Narrative  . Not on file    FAMILY HISTORY: Family History  Problem Relation Age of Onset  . Cancer Brother   . Heart disease Brother   . Diabetes Brother   . Emphysema Brother   .  Prostate cancer Brother   . Diabetes Sister   . Emphysema Mother   . Healthy Sister   . Healthy Sister   . Healthy Sister   . Diabetes Brother   . Diabetes Brother   . Diabetes Brother   . Healthy Brother     ALLERGIES:  is allergic to mevacor [lovastatin]; niacin and related; statins; and aleve [naproxen sodium].  MEDICATIONS:  Current Outpatient Medications  Medication Sig Dispense Refill  . albuterol (PROAIR HFA) 108 (90 BASE) MCG/ACT inhaler Reported on 05/24/2016    . Cholecalciferol (VITAMIN D-3) 1000 UNITS CAPS Take 1 capsule by mouth daily.     . clobetasol (TEMOVATE) 0.05 % external solution Apply 1 application topically 2 (two) times daily.     . Coenzyme Q10 (COQ-10) 100 MG CAPS Take 100 mg by mouth daily. Reported on 02/06/2016    . fluticasone furoate-vilanterol (BREO ELLIPTA) 100-25 MCG/INH AEPB Inhale 1 puff into the lungs daily. 14 each 0  . Magnesium 400 MG TABS Take 1 tablet by mouth every  other day.     . Red Yeast Rice 600 MG TABS Take 1 tablet by mouth at bedtime.     . triamcinolone cream (KENALOG) 0.1 %     . meclizine (ANTIVERT) 12.5 MG tablet Take 1 tablet (12.5 mg total) by mouth 3 (three) times daily as needed for dizziness. (Patient not taking: Reported on 10/05/2019) 30 tablet 0   No current facility-administered medications for this visit.      PHYSICAL EXAMINATION: ECOG PERFORMANCE STATUS: 1 - Symptomatic but completely ambulatory Vitals:   10/05/19 0955  BP: 106/72  Pulse: 62  Resp: 18  Temp: (!) 96.4 F (35.8 C)   Filed Weights   10/05/19 0955  Weight: 150 lb 8 oz (68.3 kg)    Physical Exam Constitutional:      General: He is not in acute distress.    Comments: Walk in  HENT:     Head: Normocephalic and atraumatic.  Eyes:     General: No scleral icterus.    Conjunctiva/sclera: Conjunctivae normal.     Pupils: Pupils are equal, round, and reactive to light.  Neck:     Musculoskeletal: Normal range of motion and neck supple.   Cardiovascular:     Rate and Rhythm: Normal rate and regular rhythm.     Heart sounds: Normal heart sounds.  Pulmonary:     Effort: Pulmonary effort is normal. No respiratory distress.     Breath sounds: No wheezing or rales.  Chest:     Chest wall: No tenderness.  Abdominal:     General: Bowel sounds are normal. There is no distension.     Palpations: Abdomen is soft. There is no mass.     Tenderness: There is no abdominal tenderness.  Musculoskeletal: Normal range of motion.        General: No deformity.  Lymphadenopathy:     Cervical: No cervical adenopathy.  Skin:    General: Skin is warm and dry.     Findings: No erythema or rash.  Neurological:     Mental Status: He is alert and oriented to person, place, and time.     Cranial Nerves: No cranial nerve deficit.     Coordination: Coordination normal.  Psychiatric:        Behavior: Behavior normal.        Thought Content: Thought content normal.      LABORATORY DATA:  I have reviewed the data as listed Lab Results  Component Value Date   WBC 5.5 10/05/2019   HGB 13.7 10/05/2019   HCT 42.3 10/05/2019   MCV 88.7 10/05/2019   PLT 248 10/05/2019   Recent Labs    03/05/19 0913 06/06/19 1329 10/05/19 0934  NA 144 143 139  K 4.7 4.4 5.1  CL 102 103 101  CO2 26 32 30  GLUCOSE 88 111* 102*  BUN 16 17 19   CREATININE 0.97 0.77 0.89  CALCIUM 9.5 9.1 9.5  GFRNONAA 73 >60 >60  GFRAA 85 >60 >60  PROT 6.2 7.0 7.2  ALBUMIN 4.3 4.1 4.2  AST 17 19 24   ALT 7 13 16   ALKPHOS 77 66 74  BILITOT <0.2 0.3 0.5   RADIOGRAPHIC STUDIES: I have personally reviewed the radiological images as listed and agreed with the findings in the report. 05/18/2018 bone scan negative for metastatic disease 08/01/2018 CT chest abdomen pelvis showed no evidence of metastatic disease.  Brachytherapy seeds in the prostate gland no findings of local regional adenopathy or metastatic disease.  Chronic  findings of advanced atherosclerotic  calcifications of thoracic and abdominal aorta and branch vessels.  Including 3 vessel coronary artery calcification.   ASSESSMENT & PLAN:  1. Malignant neoplasm of prostate ([place])   2. Encounter for monitoring androgen deprivation therapy   3. Other fatigue    # Prostate Cancer with biochemical recurrence, asymptomatic, castration sensitive.  Overall tolerating androgen deprivation PSA nadir 0.68.  Slightly trending up.  Last PSA was 0.95 Today's PSA still pending. Continue to close monitor. Recommend proceed with Lupron 30 mg injection today.    Chronic fatigue, etiology unknown.  Will check TSH today.  Orders Placed This Encounter  Procedures  . CBC with Differential/Platelet    Standing Status:   Future    Standing Expiration Date:   10/04/2020  . Comprehensive metabolic panel    Standing Status:   Future    Standing Expiration Date:   10/04/2020  . PSA    Standing Status:   Future    Standing Expiration Date:   10/04/2020  . TSH    Standing Status:   Future    Number of Occurrences:   1    Standing Expiration Date:   10/04/2020   Weber Server, MD, PhD Hematology Oncology [facility] at [facility] Pager- [phone] 10/05/2019

## 2019-11-09 ENCOUNTER — Other Ambulatory Visit: Payer: Self-pay

## 2019-11-12 ENCOUNTER — Other Ambulatory Visit: Payer: Self-pay

## 2019-11-12 ENCOUNTER — Encounter: Payer: Self-pay | Admitting: Radiation Oncology

## 2019-11-12 ENCOUNTER — Ambulatory Visit
Admission: RE | Admit: 2019-11-12 | Discharge: 2019-11-12 | Disposition: A | Discharge status: Home / Self Care | Payer: PPO | Source: Ambulatory Visit | Attending: Radiation Oncology | Admitting: Radiation Oncology

## 2019-11-12 VITALS — BP 134/66 | HR 76 | Temp 97.9°F | Resp 16 | Wt 149.6 lb

## 2019-11-12 DIAGNOSIS — C61 Malignant neoplasm of prostate: Secondary | ICD-10-CM | POA: Diagnosis not present

## 2019-11-12 DIAGNOSIS — R351 Nocturia: Secondary | ICD-10-CM | POA: Insufficient documentation

## 2019-11-12 DIAGNOSIS — Z923 Personal history of irradiation: Secondary | ICD-10-CM | POA: Diagnosis not present

## 2019-11-12 NOTE — Progress Notes (Signed)
Radiation Oncology Follow up Note  Name: Sean Wood.   Date:   11/12/2019 MRN:  [id number] DOB: 1936/11/25    This 83 y.o. male presents to the clinic today for 4-1/2-year follow-up status post I-125 interstitial implant for salvage and patient with known adenocarcinoma the prostate.Marland Kitchen  REFERRING PROVIDER: Margo Common, PA  HPI: Patient is an 83 year old male originally treated in 2009 when he had a Gleason 7 (3+4) adenocarcinoma the prostate presenting with a PSA of 6.1.  He underwent external beam IMRT radiation therapy back although developed biochemical failure in 2014 and underwent salvage I-125 interstitial implant.Marland Kitchen  His PSA has been slowly climbing although is fairly steady 5 months ago was 0.57-month ago 1.2.  He is on androgen deprivation therapy under the care of medical oncology.  Patient has nocturia x1 no specific urgency or frequency.  COMPLICATIONS OF TREATMENT: none  FOLLOW UP COMPLIANCE: keeps appointments   PHYSICAL EXAM:  BP 134/66 (BP Location: Left Arm, Patient Position: Sitting)   Pulse 76   Temp 97.9 F (36.6 C) (Tympanic)   Resp 16   Wt 149 lb 9.6 oz (67.9 kg)   BMI 22.09 kg/m  Well-developed well-nourished patient in NAD. HEENT reveals PERLA, EOMI, discs not visualized.  Oral cavity is clear. No oral mucosal lesions are identified. Neck is clear without evidence of cervical or supraclavicular adenopathy. Lungs are clear to A&P. Cardiac examination is essentially unremarkable with regular rate and rhythm without murmur rub or thrill. Abdomen is benign with no organomegaly or masses noted. Motor sensory and DTR levels are equal and symmetric in the upper and lower extremities. Cranial nerves II through XII are grossly intact. Proprioception is intact. No peripheral adenopathy or edema is identified. No motor or sensory levels are noted. Crude visual fields are within normal range.  RADIOLOGY RESULTS: No current films to review  PLAN: Present time  patient is doing well slowly rising PSA still castrate sensitive.  He is currently under androgen deprivation therapy by medical oncology.  At this time I am going to turn follow-up care over to them.  I would be happy to reevaluate the patient anytime should further treatment be indicated.  I would like to take this opportunity to thank you for allowing me to participate in the care of your patient.Noreene Filbert, MD

## 2020-02-04 ENCOUNTER — Inpatient Hospital Stay: Payer: PPO

## 2020-02-04 ENCOUNTER — Other Ambulatory Visit: Payer: Self-pay

## 2020-02-04 ENCOUNTER — Inpatient Hospital Stay: Payer: PPO | Attending: Oncology | Admitting: Oncology

## 2020-02-04 ENCOUNTER — Encounter: Payer: Self-pay | Admitting: Oncology

## 2020-02-04 VITALS — BP 127/73 | HR 60 | Temp 95.3°F | Resp 16 | Wt 147.4 lb

## 2020-02-04 DIAGNOSIS — C61 Malignant neoplasm of prostate: Secondary | ICD-10-CM

## 2020-02-04 DIAGNOSIS — Z5111 Encounter for antineoplastic chemotherapy: Secondary | ICD-10-CM | POA: Insufficient documentation

## 2020-02-04 DIAGNOSIS — Z79818 Long term (current) use of other agents affecting estrogen receptors and estrogen levels: Secondary | ICD-10-CM | POA: Diagnosis not present

## 2020-02-04 LAB — COMPREHENSIVE METABOLIC PANEL
ALT: 12 U/L (ref 0–44)
AST: 21 U/L (ref 15–41)
Albumin: 4.2 g/dL (ref 3.5–5.0)
Alkaline Phosphatase: 65 U/L (ref 38–126)
Anion gap: 9 (ref 5–15)
BUN: 20 mg/dL (ref 8–23)
CO2: 30 mmol/L (ref 22–32)
Calcium: 9.1 mg/dL (ref 8.9–10.3)
Chloride: 102 mmol/L (ref 98–111)
Creatinine, Ser: 0.95 mg/dL (ref 0.61–1.24)
GFR calc Af Amer: >60 mL/min (ref >60)
GFR calc non Af Amer: >60 mL/min (ref >60)
Glucose, Bld: 168 mg/dL — ABNORMAL HIGH (ref 70–99)
Potassium: 4.7 mmol/L (ref 3.5–5.1)
Sodium: 141 mmol/L (ref 135–145)
Total Bilirubin: 0.4 mg/dL (ref 0.3–1.2)
Total Protein: 6.9 g/dL (ref 6.5–8.1)

## 2020-02-04 LAB — CBC WITH DIFFERENTIAL/PLATELET
Abs Immature Granulocytes: 0.37 10*3/uL — ABNORMAL HIGH (ref 0.00–0.07)
Basophils Absolute: 0 10*3/uL (ref 0.0–0.1)
Basophils Relative: 0 %
Eosinophils Absolute: 0 10*3/uL (ref 0.0–0.5)
Eosinophils Relative: 1 %
HCT: 42.5 % (ref 39.0–52.0)
Hemoglobin: 13.4 g/dL (ref 13.0–17.0)
Immature Granulocytes: 9 %
Lymphocytes Relative: 43 %
Lymphs Abs: 1.9 10*3/uL (ref 0.7–4.0)
MCH: 28.7 pg (ref 26.0–34.0)
MCHC: 31.5 g/dL (ref 30.0–36.0)
MCV: 91 fL (ref 80.0–100.0)
Monocytes Absolute: 0.3 10*3/uL (ref 0.1–1.0)
Monocytes Relative: 8 %
Neutro Abs: 1.7 10*3/uL (ref 1.7–7.7)
Neutrophils Relative %: 39 %
Platelets: 237 10*3/uL (ref 150–400)
RBC: 4.67 MIL/uL (ref 4.22–5.81)
RDW: 13 % (ref 11.5–15.5)
WBC: 4.3 10*3/uL (ref 4.0–10.5)
nRBC: 0 % (ref 0.0–0.2)

## 2020-02-04 LAB — PSA: Prostatic Specific Antigen: 1.98 ng/mL (ref 0.00–4.00)

## 2020-02-04 MED ORDER — LEUPROLIDE ACETATE (4 MONTH) 30 MG IM KIT
30.0000 mg | PACK | Freq: Once | INTRAMUSCULAR | Status: AC
  Administered 2020-02-04: 30 mg via INTRAMUSCULAR
  Filled 2020-02-04: qty 30

## 2020-02-04 NOTE — Progress Notes (Signed)
Hematology/Oncology Follow up note [facility] Telephone:(336) [phone] Fax:(336) [phone]   Patient Care Team: Chrismon, Vickki Muff, PA as PCP - General (Physician Assistant) Garrel Ridgel, DPM as Consulting Physician (Podiatry) Noreene Filbert, MD as Referring Physician (Radiation Oncology) Idelle Leech, OD as Consulting Physician (Optometry) Abdalrahman Server, MD as Consulting Physician (Oncology) REASON FOR VISIT Follow up for treatment of biochemical recurrence of prostate cancer.  HISTORY OF PRESENTING ILLNESS:  Sean Wood. is a  84 y.o.  male with PMH listed below who was referred to me for evaluation of prostate cancer.   Cancer history dated back to 2009 when He was diagnosed with intermediate risk, Gleason 3+4, T1c prostate cancer in 2009. PSA at diagnosis was 6.1. IMRT was completed in August 2009. PSA nadir was 0.5. His PSA January 2013 was 0.8 and in January 2014 was 1.3. This was repeated in May 2014 and had increased to 1.8. He elected a repeat prostate biopsy in November 2014 after his PSA in October had increased to 2.2. This showed a focus of Gleason 3+3 adenocarcinoma from the right prostate involving less than 5%. He elected surveillance after discussing options. In May 2016, he was evaluated by Dr.Chrystal and he received salvage seed implantation with I-125. He subsequently follows with Dr.Chrystal for Lupron every 4 months since then. Achieve PSA nadir of 0.06 in 09/2016, and started to trend up and most recently trended up to 9.64.   # Lupron 30 mg on 05/10/2018, 06/14/2018 testosterone level was at 6  # Leupron 22.5mg  11/06/2018.  Delayed as patient reports feeling very fatigued and we discussed about holding Lupron.  29-month and restarted on 11/06/2018.  INTERVAL HISTORY Sean Wood. is a 84 y.o. male who has above history reviewed by me present for follow-up of management of biochemical recurrence of prostate cancer, castration  sensitive. Patient reports feeling well at baseline.  He has no new complaints.  Manageable hot flash.  Chronic shortness of breath at baseline.  Denies any new bone pain.   Review of Systems  Constitutional: Negative for chills, fever, malaise/fatigue and weight loss.  HENT: Negative for sore throat.   Eyes: Negative for redness.  Respiratory: Positive for shortness of breath. Negative for cough and wheezing.   Cardiovascular: Negative for chest pain, palpitations and leg swelling.  Gastrointestinal: Negative for abdominal pain, blood in stool, nausea and vomiting.  Genitourinary: Negative for dysuria.  Musculoskeletal: Negative for myalgias.  Skin: Negative for rash.  Neurological: Negative for dizziness, tingling and tremors.  Endo/Heme/Allergies: Does not bruise/bleed easily.  Psychiatric/Behavioral: Negative for hallucinations.    MEDICAL HISTORY:  Past Medical History:  Diagnosis Date  . Arthritis   . COPD (chronic obstructive pulmonary disease) ([place])   . Elevated PSA   . Hematuria, gross   . History of hiatal hernia   . HOH (hard of hearing)   . Kidney stone   . Prostate cancer ([place])   . Shortness of breath dyspnea     SURGICAL HISTORY: Past Surgical History:  Procedure Laterality Date  . [place]  . CYSTOSCOPY  06/24/2015   Procedure: CYSTOSCOPY;  Surgeon: Hollice Espy, MD;  Location: [facility] ORS;  Service: Urology;;  . HERNIA REPAIR     left and right inguinal hernia repair  . RADIOACTIVE SEED IMPLANT N/A 06/24/2015   Procedure: RADIOACTIVE SEED IMPLANT/BRACHYTHERAPY IMPLANT;  Surgeon: Hollice Espy, MD;  Location: [facility] ORS;  Service: Urology;  Laterality: N/A;    SOCIAL  HISTORY: Social History   Socioeconomic History  . Marital status: Married    Spouse name: Not on file  . Number of children: 1  . Years of education: Not on file  . Highest education level: 8th grade  Occupational History  . Occupation: retired  Tobacco Use  . Smoking  status: Former Smoker    Packs/day: 1.50    Types: Cigarettes    Quit date: 05/24/1981    Years since quitting: 38.7  . Smokeless tobacco: Current User    Types: Chew  Substance and Sexual Activity  . Alcohol use: Not Currently    Alcohol/week: 0.0 standard drinks  . Drug use: No  . Sexual activity: Not on file  Other Topics Concern  . Not on file  Social History Narrative  . Not on file   Social Determinants of Health   Financial Resource Strain:   . Difficulty of Paying Living Expenses: Not on file  Food Insecurity:   . Worried About Charity fundraiser in the Last Year: Not on file  . Ran Out of Food in the Last Year: Not on file  Transportation Needs:   . Lack of Transportation (Medical): Not on file  . Lack of Transportation (Non-Medical): Not on file  Physical Activity: Inactive  . Days of Exercise per Week: 0 days  . Minutes of Exercise per Session: 0 min  Stress: No Stress Concern Present  . Feeling of Stress : Not at all  Social Connections: Unknown  . Frequency of Communication with Friends and Family: Patient refused  . Frequency of Social Gatherings with Friends and Family: Patient refused  . Attends Religious Services: Patient refused  . Active Member of Clubs or Organizations: Patient refused  . Attends Archivist Meetings: Patient refused  . Marital Status: Patient refused  Intimate Partner Violence: Unknown  . Fear of Current or Ex-Partner: Patient refused  . Emotionally Abused: Patient refused  . Physically Abused: Patient refused  . Sexually Abused: Patient refused    FAMILY HISTORY: Family History  Problem Relation Age of Onset  . Cancer Brother   . Heart disease Brother   . Diabetes Brother   . Emphysema Brother   . Prostate cancer Brother   . Diabetes Sister   . Emphysema Mother   . Healthy Sister   . Healthy Sister   . Healthy Sister   . Diabetes Brother   . Diabetes Brother   . Diabetes Brother   . Healthy Brother      ALLERGIES:  is allergic to mevacor [lovastatin]; niacin and related; statins; and aleve [naproxen sodium].  MEDICATIONS:  Current Outpatient Medications  Medication Sig Dispense Refill  . albuterol (PROAIR HFA) 108 (90 BASE) MCG/ACT inhaler Reported on 05/24/2016    . Cholecalciferol (VITAMIN D-3) 1000 UNITS CAPS Take 1 capsule by mouth daily.     . clobetasol (TEMOVATE) 0.05 % external solution Apply 1 application topically 2 (two) times daily.     . Coenzyme Q10 (COQ-10) 100 MG CAPS Take 100 mg by mouth daily. Reported on 02/06/2016    . fluticasone furoate-vilanterol (BREO ELLIPTA) 100-25 MCG/INH AEPB Inhale 1 puff into the lungs daily. 14 each 0  . Magnesium 400 MG TABS Take 1 tablet by mouth every other day.     . Red Yeast Rice 600 MG TABS Take 1 tablet by mouth at bedtime.     . triamcinolone cream (KENALOG) 0.1 %     . meclizine (ANTIVERT) 12.5 MG tablet  Take 1 tablet (12.5 mg total) by mouth 3 (three) times daily as needed for dizziness. (Patient not taking: Reported on 10/05/2019) 30 tablet 0   No current facility-administered medications for this visit.     PHYSICAL EXAMINATION: ECOG PERFORMANCE STATUS: 1 - Symptomatic but completely ambulatory Vitals:   02/04/20 1315  BP: 127/73  Pulse: 60  Resp: 16  Temp: (!) 95.3 F (35.2 C)   Filed Weights   02/04/20 1315  Weight: 147 lb 6.4 oz (66.9 kg)    Physical Exam Constitutional:      General: He is not in acute distress.    Comments: Patient walks independently.  HENT:     Head: Normocephalic and atraumatic.  Eyes:     General: No scleral icterus.    Conjunctiva/sclera: Conjunctivae normal.     Pupils: Pupils are equal, round, and reactive to light.  Cardiovascular:     Rate and Rhythm: Normal rate and regular rhythm.     Heart sounds: Normal heart sounds.  Pulmonary:     Effort: Pulmonary effort is normal. No respiratory distress.     Breath sounds: No wheezing or rales.  Chest:     Chest wall: No  tenderness.  Abdominal:     General: Bowel sounds are normal. There is no distension.     Palpations: Abdomen is soft. There is no mass.     Tenderness: There is no abdominal tenderness.  Musculoskeletal:        General: No deformity. Normal range of motion.     Cervical back: Normal range of motion and neck supple.  Lymphadenopathy:     Cervical: No cervical adenopathy.  Skin:    General: Skin is warm and dry.     Findings: No erythema or rash.  Neurological:     Mental Status: He is alert and oriented to person, place, and time.     Cranial Nerves: No cranial nerve deficit.     Coordination: Coordination normal.  Psychiatric:        Behavior: Behavior normal.        Thought Content: Thought content normal.      LABORATORY DATA:  I have reviewed the data as listed Lab Results  Component Value Date   WBC 4.3 02/04/2020   HGB 13.4 02/04/2020   HCT 42.5 02/04/2020   MCV 91.0 02/04/2020   PLT 237 02/04/2020   Recent Labs    06/06/19 1329 10/05/19 0934 02/04/20 1250  NA 143 139 141  K 4.4 5.1 4.7  CL 103 101 102  CO2 32 30 30  GLUCOSE 111* 102* 168*  BUN 17 19 20   CREATININE 0.77 0.89 0.95  CALCIUM 9.1 9.5 9.1  GFRNONAA >60 >60 >60  GFRAA >60 >60 >60  PROT 7.0 7.2 6.9  ALBUMIN 4.1 4.2 4.2  AST 19 24 21   ALT 13 16 12   ALKPHOS 66 74 65  BILITOT 0.3 0.5 0.4   RADIOGRAPHIC STUDIES: I have personally reviewed the radiological images as listed and agreed with the findings in the report. 05/18/2018 bone scan negative for metastatic disease 08/01/2018 CT chest abdomen pelvis showed no evidence of metastatic disease.  Brachytherapy seeds in the prostate gland no findings of local regional adenopathy or metastatic disease.  Chronic findings of advanced atherosclerotic calcifications of thoracic and abdominal aorta and branch vessels.  Including 3 vessel coronary artery calcification.   ASSESSMENT & PLAN:  1. Malignant neoplasm of prostate ([place])   2. Encounter for  monitoring androgen deprivation  therapy    # Prostate Cancer with biochemical recurrence, asymptomatic, castration sensitive.  Overall tolerating androgen deprivation Labs reviewed and discussed with patient PSA nadired at 0.68, trending up.  Today's PSA has increased to 1.98. I discussed with patient that he may be in the process of developing castration resistant prostate cancer. Currently he is asymptomatic.  And also he is not interested in starting any chemo or IV chemotherapy. I recommend to continue watchful waiting. Continue ADT. Proceed with Lupron 30 mg injection today. Patient to follow-up in 4 months.    Orders Placed This Encounter  Procedures  . CBC with Differential/Platelet    Standing Status:   Future    Standing Expiration Date:   02/03/2021  . Comprehensive metabolic panel    Standing Status:   Future    Standing Expiration Date:   02/03/2021  . PSA    Standing Status:   Future    Standing Expiration Date:   02/03/2021  . Testosterone    Standing Status:   Future    Standing Expiration Date:   02/03/2021   Brnadon Server, MD, PhD Hematology Oncology [facility] at [facility] Pager- [phone] 02/04/2020

## 2020-02-04 NOTE — Progress Notes (Signed)
Patient does not offer any problems today.  

## 2020-06-03 ENCOUNTER — Inpatient Hospital Stay: Payer: PPO

## 2020-06-03 ENCOUNTER — Other Ambulatory Visit: Payer: Self-pay

## 2020-06-03 ENCOUNTER — Inpatient Hospital Stay: Payer: PPO | Attending: Oncology

## 2020-06-03 ENCOUNTER — Inpatient Hospital Stay (HOSPITAL_BASED_OUTPATIENT_CLINIC_OR_DEPARTMENT_OTHER): Payer: PPO | Admitting: Oncology

## 2020-06-03 ENCOUNTER — Encounter: Payer: Self-pay | Admitting: Oncology

## 2020-06-03 VITALS — BP 114/62 | HR 57 | Temp 96.1°F | Resp 16 | Wt 142.9 lb

## 2020-06-03 DIAGNOSIS — Z5111 Encounter for antineoplastic chemotherapy: Secondary | ICD-10-CM | POA: Insufficient documentation

## 2020-06-03 DIAGNOSIS — C61 Malignant neoplasm of prostate: Secondary | ICD-10-CM

## 2020-06-03 DIAGNOSIS — Z79818 Long term (current) use of other agents affecting estrogen receptors and estrogen levels: Secondary | ICD-10-CM

## 2020-06-03 LAB — COMPREHENSIVE METABOLIC PANEL
ALT: 13 U/L (ref 0–44)
AST: 20 U/L (ref 15–41)
Albumin: 4.1 g/dL (ref 3.5–5.0)
Alkaline Phosphatase: 65 U/L (ref 38–126)
Anion gap: 8 (ref 5–15)
BUN: 20 mg/dL (ref 8–23)
CO2: 31 mmol/L (ref 22–32)
Calcium: 9 mg/dL (ref 8.9–10.3)
Chloride: 101 mmol/L (ref 98–111)
Creatinine, Ser: 0.92 mg/dL (ref 0.61–1.24)
GFR calc Af Amer: >60 mL/min (ref >60)
GFR calc non Af Amer: >60 mL/min (ref >60)
Glucose, Bld: 103 mg/dL — ABNORMAL HIGH (ref 70–99)
Potassium: 4.3 mmol/L (ref 3.5–5.1)
Sodium: 140 mmol/L (ref 135–145)
Total Bilirubin: 0.6 mg/dL (ref 0.3–1.2)
Total Protein: 7 g/dL (ref 6.5–8.1)

## 2020-06-03 LAB — CBC WITH DIFFERENTIAL/PLATELET
Abs Immature Granulocytes: 0.24 10*3/uL — ABNORMAL HIGH (ref 0.00–0.07)
Basophils Absolute: 0 10*3/uL (ref 0.0–0.1)
Basophils Relative: 0 %
Eosinophils Absolute: 0 10*3/uL (ref 0.0–0.5)
Eosinophils Relative: 1 %
HCT: 40.8 % (ref 39.0–52.0)
Hemoglobin: 13.6 g/dL (ref 13.0–17.0)
Immature Granulocytes: 5 %
Lymphocytes Relative: 45 %
Lymphs Abs: 2.1 10*3/uL (ref 0.7–4.0)
MCH: 29 pg (ref 26.0–34.0)
MCHC: 33.3 g/dL (ref 30.0–36.0)
MCV: 87 fL (ref 80.0–100.0)
Monocytes Absolute: 0.6 10*3/uL (ref 0.1–1.0)
Monocytes Relative: 12 %
Neutro Abs: 1.8 10*3/uL (ref 1.7–7.7)
Neutrophils Relative %: 37 %
Platelets: 210 10*3/uL (ref 150–400)
RBC: 4.69 MIL/uL (ref 4.22–5.81)
RDW: 13.3 % (ref 11.5–15.5)
WBC: 4.7 10*3/uL (ref 4.0–10.5)
nRBC: 0 % (ref 0.0–0.2)

## 2020-06-03 LAB — PSA: Prostatic Specific Antigen: 2.58 ng/mL (ref 0.00–4.00)

## 2020-06-03 MED ORDER — LEUPROLIDE ACETATE (4 MONTH) 30 MG ~~LOC~~ KIT
30.0000 mg | PACK | Freq: Once | SUBCUTANEOUS | Status: AC
  Administered 2020-06-03: 30 mg via SUBCUTANEOUS
  Filled 2020-06-03: qty 30

## 2020-06-03 NOTE — Progress Notes (Signed)
Hematology/Oncology Follow up note [facility] Telephone:(336) [phone] Fax:(336) [phone]   Patient Care Team: Chrismon, Vickki Muff, PA as PCP - General (Physician Assistant) Garrel Ridgel, DPM as Consulting Physician (Podiatry) Noreene Filbert, MD as Referring Physician (Radiation Oncology) Idelle Leech, OD as Consulting Physician (Optometry) Kyrell Server, MD as Consulting Physician (Oncology) REASON FOR VISIT Follow up for treatment of biochemical recurrence of prostate cancer.  HISTORY OF PRESENTING ILLNESS:  Sean Wood. is a  84 y.o.  male with PMH listed below who was referred to me for evaluation of prostate cancer.   Cancer history dated back to 2009 when He was diagnosed with intermediate risk, Gleason 3+4, T1c prostate cancer in 2009. PSA at diagnosis was 6.1. IMRT was completed in August 2009. PSA nadir was 0.5. His PSA January 2013 was 0.8 and in January 2014 was 1.3. This was repeated in May 2014 and had increased to 1.8. He elected a repeat prostate biopsy in November 2014 after his PSA in October had increased to 2.2. This showed a focus of Gleason 3+3 adenocarcinoma from the right prostate involving less than 5%. He elected surveillance after discussing options. In May 2016, he was evaluated by Dr.Chrystal and he received salvage seed implantation with I-125. He subsequently follows with Dr.Chrystal for Lupron every 4 months since then. Achieve PSA nadir of 0.06 in 09/2016, and started to trend up and most recently trended up to 9.64.   # Lupron 30 mg on 05/10/2018, 06/14/2018 testosterone level was at 6  # Leupron 22.5mg  11/06/2018.  Delayed as patient reports feeling very fatigued and we discussed about holding Lupron.  51-month and restarted on 11/06/2018.  INTERVAL HISTORY Sean Wood. is a 84 y.o. male who has above history reviewed by me present for follow-up of management of biochemical recurrence of prostate cancer, castration  sensitive. Patient reports feeling well at baseline.  He has no new complaints.  He has manageable hot flash. Chronic shortness of breath unchanged.    Review of Systems  Constitutional: Negative for chills, fever, malaise/fatigue and weight loss.  HENT: Negative for sore throat.   Eyes: Negative for redness.  Respiratory: Positive for shortness of breath. Negative for cough and wheezing.   Cardiovascular: Negative for chest pain, palpitations and leg swelling.  Gastrointestinal: Negative for abdominal pain, blood in stool, nausea and vomiting.  Genitourinary: Negative for dysuria.  Musculoskeletal: Negative for myalgias.  Skin: Negative for rash.  Neurological: Negative for dizziness, tingling and tremors.  Endo/Heme/Allergies: Does not bruise/bleed easily.  Psychiatric/Behavioral: Negative for hallucinations.    MEDICAL HISTORY:  Past Medical History:  Diagnosis Date  . Arthritis   . COPD (chronic obstructive pulmonary disease) ([place])   . Elevated PSA   . Hematuria, gross   . History of hiatal hernia   . HOH (hard of hearing)   . Kidney stone   . Prostate cancer ([place])   . Shortness of breath dyspnea     SURGICAL HISTORY: Past Surgical History:  Procedure Laterality Date  . [place]  . CYSTOSCOPY  06/24/2015   Procedure: CYSTOSCOPY;  Surgeon: Hollice Espy, MD;  Location: [facility] ORS;  Service: Urology;;  . HERNIA REPAIR     left and right inguinal hernia repair  . RADIOACTIVE SEED IMPLANT N/A 06/24/2015   Procedure: RADIOACTIVE SEED IMPLANT/BRACHYTHERAPY IMPLANT;  Surgeon: Hollice Espy, MD;  Location: [facility] ORS;  Service: Urology;  Laterality: N/A;    SOCIAL HISTORY: Social History  Socioeconomic History  . Marital status: Married    Spouse name: Not on file  . Number of children: 1  . Years of education: Not on file  . Highest education level: 8th grade  Occupational History  . Occupation: retired  Tobacco Use  . Smoking status: Former Smoker     Packs/day: 1.50    Types: Cigarettes    Quit date: 05/24/1981    Years since quitting: 39.0  . Smokeless tobacco: Current User    Types: Chew  Vaping Use  . Vaping Use: Never used  Substance and Sexual Activity  . Alcohol use: Not Currently    Alcohol/week: 0.0 standard drinks  . Drug use: No  . Sexual activity: Not on file  Other Topics Concern  . Not on file  Social History Narrative  . Not on file   Social Determinants of Health   Financial Resource Strain:   . Difficulty of Paying Living Expenses:   Food Insecurity:   . Worried About Charity fundraiser in the Last Year:   . Arboriculturist in the Last Year:   Transportation Needs:   . Film/video editor (Medical):   Marland Kitchen Lack of Transportation (Non-Medical):   Physical Activity: Inactive  . Days of Exercise per Week: 0 days  . Minutes of Exercise per Session: 0 min  Stress: No Stress Concern Present  . Feeling of Stress : Not at all  Social Connections: Unknown  . Frequency of Communication with Friends and Family: Patient refused  . Frequency of Social Gatherings with Friends and Family: Patient refused  . Attends Religious Services: Patient refused  . Active Member of Clubs or Organizations: Patient refused  . Attends Archivist Meetings: Patient refused  . Marital Status: Patient refused  Intimate Partner Violence: Unknown  . Fear of Current or Ex-Partner: Patient refused  . Emotionally Abused: Patient refused  . Physically Abused: Patient refused  . Sexually Abused: Patient refused    FAMILY HISTORY: Family History  Problem Relation Age of Onset  . Cancer Brother   . Heart disease Brother   . Diabetes Brother   . Emphysema Brother   . Prostate cancer Brother   . Diabetes Sister   . Emphysema Mother   . Healthy Sister   . Healthy Sister   . Healthy Sister   . Diabetes Brother   . Diabetes Brother   . Diabetes Brother   . Healthy Brother     ALLERGIES:  is allergic to mevacor  [lovastatin], niacin and related, statins, and aleve [naproxen sodium].  MEDICATIONS:  Current Outpatient Medications  Medication Sig Dispense Refill  . albuterol (PROAIR HFA) 108 (90 BASE) MCG/ACT inhaler Reported on 05/24/2016    . Cholecalciferol (VITAMIN D-3) 1000 UNITS CAPS Take 1 capsule by mouth daily.     . clobetasol (TEMOVATE) 0.05 % external solution Apply 1 application topically 2 (two) times daily.     . Coenzyme Q10 (COQ-10) 100 MG CAPS Take 100 mg by mouth daily. Reported on 02/06/2016    . fluticasone furoate-vilanterol (BREO ELLIPTA) 100-25 MCG/INH AEPB Inhale 1 puff into the lungs daily. 14 each 0  . Magnesium 400 MG TABS Take 1 tablet by mouth every other day.     . magnesium oxide (MAG-OX) 400 MG tablet Take 400 mg by mouth daily.    . Misc Natural Products (DANDELION ROOT PO) Take by mouth. 1 BID    . Red Yeast Rice 600 MG TABS Take 1  tablet by mouth at bedtime.     . triamcinolone cream (KENALOG) 0.1 %     . meclizine (ANTIVERT) 12.5 MG tablet Take 1 tablet (12.5 mg total) by mouth 3 (three) times daily as needed for dizziness. (Patient not taking: Reported on 10/05/2019) 30 tablet 0   No current facility-administered medications for this visit.     PHYSICAL EXAMINATION: ECOG PERFORMANCE STATUS: 1 - Symptomatic but completely ambulatory Vitals:   06/03/20 1032  BP: 114/62  Pulse: (!) 57  Resp: 16  Temp: (!) 96.1 F (35.6 C)   Filed Weights   06/03/20 1032  Weight: 142 lb 14.4 oz (64.8 kg)    Physical Exam Constitutional:      General: He is not in acute distress.    Comments: Patient walks independently.  HENT:     Head: Normocephalic and atraumatic.  Eyes:     General: No scleral icterus.    Conjunctiva/sclera: Conjunctivae normal.     Pupils: Pupils are equal, round, and reactive to light.  Cardiovascular:     Rate and Rhythm: Normal rate and regular rhythm.     Heart sounds: Normal heart sounds.  Pulmonary:     Effort: Pulmonary effort is  normal. No respiratory distress.     Breath sounds: No wheezing or rales.  Chest:     Chest wall: No tenderness.  Abdominal:     General: Bowel sounds are normal. There is no distension.     Palpations: Abdomen is soft. There is no mass.     Tenderness: There is no abdominal tenderness.  Musculoskeletal:        General: No deformity. Normal range of motion.     Cervical back: Normal range of motion and neck supple.  Lymphadenopathy:     Cervical: No cervical adenopathy.  Skin:    General: Skin is warm and dry.     Findings: No erythema or rash.  Neurological:     Mental Status: He is alert and oriented to person, place, and time. Mental status is at baseline.     Cranial Nerves: No cranial nerve deficit.     Coordination: Coordination normal.  Psychiatric:        Mood and Affect: Mood normal.      LABORATORY DATA:  I have reviewed the data as listed Lab Results  Component Value Date   WBC 4.7 06/03/2020   HGB 13.6 06/03/2020   HCT 40.8 06/03/2020   MCV 87.0 06/03/2020   PLT 210 06/03/2020   Recent Labs    10/05/19 0934 02/04/20 1250 06/03/20 0951  NA 139 141 140  K 5.1 4.7 4.3  CL 101 102 101  CO2 30 30 31   GLUCOSE 102* 168* 103*  BUN 19 20 20   CREATININE 0.89 0.95 0.92  CALCIUM 9.5 9.1 9.0  GFRNONAA >60 >60 >60  GFRAA >60 >60 >60  PROT 7.2 6.9 7.0  ALBUMIN 4.2 4.2 4.1  AST 24 21 20   ALT 16 12 13   ALKPHOS 74 65 65  BILITOT 0.5 0.4 0.6   RADIOGRAPHIC STUDIES: I have personally reviewed the radiological images as listed and agreed with the findings in the report. 05/18/2018 bone scan negative for metastatic disease 08/01/2018 CT chest abdomen pelvis showed no evidence of metastatic disease.  Brachytherapy seeds in the prostate gland no findings of local regional adenopathy or metastatic disease.  Chronic findings of advanced atherosclerotic calcifications of thoracic and abdominal aorta and branch vessels.  Including 3 vessel coronary artery  calcification.   ASSESSMENT & PLAN:  1. Malignant neoplasm of prostate ([place])   2. Encounter for monitoring androgen deprivation therapy    # Prostate Cancer with biochemical recurrence, asymptomatic, castration sensitive.  Patient has been on androgen deprivation therapy with Lupron every 4 months PSA has been trended. 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 Clinically he is asymptomatic. He expresses his wishes of not to proceed with any IV chemotherapy or oral antineoplasm treatments. I recommend watchful waiting.  I will hold off additional image work-up at this point given that he is not willing to proceed with any additional treatment at this point. Continue ADT.  Patient will proceed with Lupron today.  He will follow up in 4 months.  Orders Placed This Encounter  Procedures  . CBC with Differential/Platelet    Standing Status:   Future    Standing Expiration Date:   06/03/2021  . Comprehensive metabolic panel    Standing Status:   Future    Standing Expiration Date:   06/03/2021  . PSA    Standing Status:   Future    Standing Expiration Date:   06/03/2021  . Testosterone    Standing Status:   Future    Standing Expiration Date:   06/03/2021   Karmello Server, MD, PhD Hematology Oncology [facility] at [facility] Pager- [phone] 06/03/2020

## 2020-06-03 NOTE — Progress Notes (Signed)
Patient denies new problems/concerns today.   °

## 2020-06-04 LAB — TESTOSTERONE: Testosterone: <3 ng/dL — ABNORMAL LOW (ref 264–916)

## 2020-07-15 ENCOUNTER — Other Ambulatory Visit: Payer: Self-pay

## 2020-10-06 ENCOUNTER — Other Ambulatory Visit: Payer: Self-pay

## 2020-10-06 ENCOUNTER — Inpatient Hospital Stay: Payer: PPO | Attending: Oncology

## 2020-10-06 DIAGNOSIS — C61 Malignant neoplasm of prostate: Secondary | ICD-10-CM | POA: Diagnosis not present

## 2020-10-06 DIAGNOSIS — Z23 Encounter for immunization: Secondary | ICD-10-CM | POA: Insufficient documentation

## 2020-10-06 DIAGNOSIS — Z5111 Encounter for antineoplastic chemotherapy: Secondary | ICD-10-CM | POA: Insufficient documentation

## 2020-10-06 LAB — COMPREHENSIVE METABOLIC PANEL
ALT: 12 U/L (ref 0–44)
AST: 20 U/L (ref 15–41)
Albumin: 4.1 g/dL (ref 3.5–5.0)
Alkaline Phosphatase: 60 U/L (ref 38–126)
Anion gap: 3 — ABNORMAL LOW (ref 5–15)
BUN: 20 mg/dL (ref 8–23)
CO2: 34 mmol/L — ABNORMAL HIGH (ref 22–32)
Calcium: 8.7 mg/dL — ABNORMAL LOW (ref 8.9–10.3)
Chloride: 104 mmol/L (ref 98–111)
Creatinine, Ser: 1.05 mg/dL (ref 0.61–1.24)
GFR, Estimated: >60 mL/min (ref >60)
Glucose, Bld: 104 mg/dL — ABNORMAL HIGH (ref 70–99)
Potassium: 3.9 mmol/L (ref 3.5–5.1)
Sodium: 141 mmol/L (ref 135–145)
Total Bilirubin: 0.5 mg/dL (ref 0.3–1.2)
Total Protein: 6.8 g/dL (ref 6.5–8.1)

## 2020-10-06 LAB — CBC WITH DIFFERENTIAL/PLATELET
Abs Immature Granulocytes: 0.19 10*3/uL — ABNORMAL HIGH (ref 0.00–0.07)
Basophils Absolute: 0 10*3/uL (ref 0.0–0.1)
Basophils Relative: 0 %
Eosinophils Absolute: 0 10*3/uL (ref 0.0–0.5)
Eosinophils Relative: 0 %
HCT: 41.1 % (ref 39.0–52.0)
Hemoglobin: 13.4 g/dL (ref 13.0–17.0)
Immature Granulocytes: 4 %
Lymphocytes Relative: 42 %
Lymphs Abs: 2 10*3/uL (ref 0.7–4.0)
MCH: 28.7 pg (ref 26.0–34.0)
MCHC: 32.6 g/dL (ref 30.0–36.0)
MCV: 88 fL (ref 80.0–100.0)
Monocytes Absolute: 0.6 10*3/uL (ref 0.1–1.0)
Monocytes Relative: 14 %
Neutro Abs: 1.9 10*3/uL (ref 1.7–7.7)
Neutrophils Relative %: 40 %
Platelets: 188 10*3/uL (ref 150–400)
RBC: 4.67 MIL/uL (ref 4.22–5.81)
RDW: 13.3 % (ref 11.5–15.5)
WBC: 4.7 10*3/uL (ref 4.0–10.5)
nRBC: 0 % (ref 0.0–0.2)

## 2020-10-06 LAB — PSA: Prostatic Specific Antigen: 3.03 ng/mL (ref 0.00–4.00)

## 2020-10-07 ENCOUNTER — Other Ambulatory Visit: Payer: PPO

## 2020-10-07 ENCOUNTER — Inpatient Hospital Stay: Payer: PPO

## 2020-10-07 ENCOUNTER — Inpatient Hospital Stay (HOSPITAL_BASED_OUTPATIENT_CLINIC_OR_DEPARTMENT_OTHER): Payer: PPO | Admitting: Oncology

## 2020-10-07 ENCOUNTER — Encounter: Payer: Self-pay | Admitting: Oncology

## 2020-10-07 VITALS — BP 118/72 | HR 62 | Temp 96.7°F | Resp 18 | Wt 147.0 lb

## 2020-10-07 DIAGNOSIS — C61 Malignant neoplasm of prostate: Secondary | ICD-10-CM | POA: Diagnosis not present

## 2020-10-07 DIAGNOSIS — Z23 Encounter for immunization: Secondary | ICD-10-CM | POA: Diagnosis not present

## 2020-10-07 DIAGNOSIS — Z5111 Encounter for antineoplastic chemotherapy: Secondary | ICD-10-CM | POA: Diagnosis not present

## 2020-10-07 LAB — TESTOSTERONE: Testosterone: 3 ng/dL — ABNORMAL LOW (ref 264–916)

## 2020-10-07 MED ORDER — LEUPROLIDE ACETATE (4 MONTH) 30 MG ~~LOC~~ KIT
30.0000 mg | PACK | Freq: Once | SUBCUTANEOUS | Status: AC
  Administered 2020-10-07: 30 mg via SUBCUTANEOUS
  Filled 2020-10-07: qty 30

## 2020-10-07 MED ORDER — INFLUENZA VAC A&B SA ADJ QUAD 0.5 ML IM PRSY: 0.5000 mL | PREFILLED_SYRINGE | Freq: Once | INTRAMUSCULAR | Status: DC

## 2020-10-07 MED ORDER — INFLUENZA VAC A&B SA ADJ QUAD 0.5 ML IM PRSY
0.5000 mL | PREFILLED_SYRINGE | Freq: Once | INTRAMUSCULAR | Status: AC
  Administered 2020-10-07: 0.5 mL via INTRAMUSCULAR
  Filled 2020-10-07: qty 0.5

## 2020-10-07 MED ORDER — LEUPROLIDE ACETATE (4 MONTH) 30 MG ~~LOC~~ KIT
30.0000 mg | PACK | Freq: Once | SUBCUTANEOUS | Status: DC
  Filled 2020-10-07: qty 30

## 2020-10-07 NOTE — Progress Notes (Signed)
Pt here for follow up. No new concerns voiced.   

## 2020-10-07 NOTE — Progress Notes (Signed)
Hematology/Oncology Follow up note [facility] Telephone:(336) [phone] Fax:(336) [phone]   Patient Care Team: Chrismon, Vickki Muff, PA as PCP - General (Physician Assistant) Garrel Ridgel, DPM as Consulting Physician (Podiatry) Noreene Filbert, MD as Referring Physician (Radiation Oncology) Idelle Leech, OD as Consulting Physician (Optometry) Ogle Server, MD as Consulting Physician (Oncology) REASON FOR VISIT Follow up for treatment of biochemical recurrence of prostate cancer.  HISTORY OF PRESENTING ILLNESS:  Sean Wood. is a  84 y.o.  male with PMH listed below who was referred to me for evaluation of prostate cancer.   Cancer history dated back to 2009 when He was diagnosed with intermediate risk, Gleason 3+4, T1c prostate cancer in 2009. PSA at diagnosis was 6.1. IMRT was completed in August 2009. PSA nadir was 0.5. His PSA January 2013 was 0.8 and in January 2014 was 1.3. This was repeated in May 2014 and had increased to 1.8. He elected a repeat prostate biopsy in November 2014 after his PSA in October had increased to 2.2. This showed a focus of Gleason 3+3 adenocarcinoma from the right prostate involving less than 5%. He elected surveillance after discussing options. In May 2016, he was evaluated by Dr.Chrystal and he received salvage seed implantation with I-125. He subsequently follows with Dr.Chrystal for Lupron every 4 months since then. Achieve PSA nadir of 0.06 in 09/2016, and started to trend up and most recently trended up to 9.64.   # Lupron 30 mg on 05/10/2018, 06/14/2018 testosterone level was at 6  # Leupron 22.5mg  11/06/2018.  Delayed as patient reports feeling very fatigued and we discussed about holding Lupron.  86-month and restarted on 11/06/2018.  INTERVAL HISTORY Sean Wood. is a 84 y.o. male who has above history reviewed by me present for follow-up of management of biochemical recurrence of prostate cancer Patient reports feeling  well at baseline.  He denies any new bone pain.  Chronic shortness of breath due to emphysema. Patient has been on androgen deprivation therapy.  Manageable hot flash.     Review of Systems  Constitutional: Negative for chills, fever, malaise/fatigue and weight loss.  HENT: Negative for sore throat.   Eyes: Negative for redness.  Respiratory: Positive for shortness of breath. Negative for cough and wheezing.   Cardiovascular: Negative for chest pain, palpitations and leg swelling.  Gastrointestinal: Negative for abdominal pain, blood in stool, nausea and vomiting.  Genitourinary: Negative for dysuria.  Musculoskeletal: Negative for myalgias.  Skin: Negative for rash.  Neurological: Negative for dizziness, tingling and tremors.  Endo/Heme/Allergies: Does not bruise/bleed easily.  Psychiatric/Behavioral: Negative for hallucinations.    MEDICAL HISTORY:  Past Medical History:  Diagnosis Date  . Arthritis   . COPD (chronic obstructive pulmonary disease) ([place])   . Elevated PSA   . Hematuria, gross   . History of hiatal hernia   . HOH (hard of hearing)   . Kidney stone   . Prostate cancer ([place])   . Shortness of breath dyspnea     SURGICAL HISTORY: Past Surgical History:  Procedure Laterality Date  . [place]  . CYSTOSCOPY  06/24/2015   Procedure: CYSTOSCOPY;  Surgeon: Hollice Espy, MD;  Location: [facility] ORS;  Service: Urology;;  . HERNIA REPAIR     left and right inguinal hernia repair  . RADIOACTIVE SEED IMPLANT N/A 06/24/2015   Procedure: RADIOACTIVE SEED IMPLANT/BRACHYTHERAPY IMPLANT;  Surgeon: Hollice Espy, MD;  Location: [facility] ORS;  Service: Urology;  Laterality: N/A;  SOCIAL HISTORY: Social History   Socioeconomic History  . Marital status: Married    Spouse name: Not on file  . Number of children: 1  . Years of education: Not on file  . Highest education level: 8th grade  Occupational History  . Occupation: retired  Tobacco Use  . Smoking  status: Former Smoker    Packs/day: 1.50    Types: Cigarettes    Quit date: 05/24/1981    Years since quitting: 39.4  . Smokeless tobacco: Current User    Types: Chew  Vaping Use  . Vaping Use: Never used  Substance and Sexual Activity  . Alcohol use: Not Currently    Alcohol/week: 0.0 standard drinks  . Drug use: No  . Sexual activity: Not on file  Other Topics Concern  . Not on file  Social History Narrative  . Not on file   Social Determinants of Health   Financial Resource Strain:   . Difficulty of Paying Living Expenses: Not on file  Food Insecurity:   . Worried About Charity fundraiser in the Last Year: Not on file  . Ran Out of Food in the Last Year: Not on file  Transportation Needs:   . Lack of Transportation (Medical): Not on file  . Lack of Transportation (Non-Medical): Not on file  Physical Activity:   . Days of Exercise per Week: Not on file  . Minutes of Exercise per Session: Not on file  Stress:   . Feeling of Stress : Not on file  Social Connections:   . Frequency of Communication with Friends and Family: Not on file  . Frequency of Social Gatherings with Friends and Family: Not on file  . Attends Religious Services: Not on file  . Active Member of Clubs or Organizations: Not on file  . Attends Archivist Meetings: Not on file  . Marital Status: Not on file  Intimate Partner Violence:   . Fear of Current or Ex-Partner: Not on file  . Emotionally Abused: Not on file  . Physically Abused: Not on file  . Sexually Abused: Not on file    FAMILY HISTORY: Family History  Problem Relation Age of Onset  . Cancer Brother   . Heart disease Brother   . Diabetes Brother   . Emphysema Brother   . Prostate cancer Brother   . Diabetes Sister   . Emphysema Mother   . Healthy Sister   . Healthy Sister   . Healthy Sister   . Diabetes Brother   . Diabetes Brother   . Diabetes Brother   . Healthy Brother     ALLERGIES:  is allergic to mevacor  [lovastatin], niacin and related, statins, and aleve [naproxen sodium].  MEDICATIONS:  Current Outpatient Medications  Medication Sig Dispense Refill  . albuterol (PROAIR HFA) 108 (90 BASE) MCG/ACT inhaler Reported on 05/24/2016    . Cholecalciferol (VITAMIN D-3) 1000 UNITS CAPS Take 1 capsule by mouth daily.     . clobetasol (TEMOVATE) 0.05 % external solution Apply 1 application topically 2 (two) times daily.     . Coenzyme Q10 (COQ-10) 100 MG CAPS Take 100 mg by mouth daily. Reported on 02/06/2016    . fluticasone furoate-vilanterol (BREO ELLIPTA) 100-25 MCG/INH AEPB Inhale 1 puff into the lungs daily. 14 each 0  . Magnesium 400 MG TABS Take 1 tablet by mouth every other day.     . meclizine (ANTIVERT) 12.5 MG tablet Take 1 tablet (12.5 mg total) by mouth 3 (  three) times daily as needed for dizziness. 30 tablet 0  . Misc Natural Products (DANDELION ROOT PO) Take by mouth. 1 BID    . triamcinolone cream (KENALOG) 0.1 %     . magnesium oxide (MAG-OX) 400 MG tablet Take 400 mg by mouth daily. (Patient not taking: Reported on 10/07/2020)    . Red Yeast Rice 600 MG TABS Take 1 tablet by mouth at bedtime.  (Patient not taking: Reported on 10/07/2020)     No current facility-administered medications for this visit.     PHYSICAL EXAMINATION: ECOG PERFORMANCE STATUS: 1 - Symptomatic but completely ambulatory Vitals:   10/07/20 1021  BP: 118/72  Pulse: 62  Resp: 18  Temp: (!) 96.7 F (35.9 C)   Filed Weights   10/07/20 1021  Weight: 147 lb (66.7 kg)    Physical Exam Constitutional:      General: He is not in acute distress.    Comments: Patient walks independently.  HENT:     Head: Normocephalic and atraumatic.  Eyes:     General: No scleral icterus.    Conjunctiva/sclera: Conjunctivae normal.     Pupils: Pupils are equal, round, and reactive to light.  Cardiovascular:     Rate and Rhythm: Normal rate and regular rhythm.     Heart sounds: Normal heart sounds.  Pulmonary:      Effort: Pulmonary effort is normal. No respiratory distress.     Breath sounds: No wheezing or rales.  Chest:     Chest wall: No tenderness.  Abdominal:     General: Bowel sounds are normal. There is no distension.     Palpations: Abdomen is soft. There is no mass.     Tenderness: There is no abdominal tenderness.  Musculoskeletal:        General: No deformity. Normal range of motion.     Cervical back: Normal range of motion and neck supple.  Lymphadenopathy:     Cervical: No cervical adenopathy.  Skin:    General: Skin is warm and dry.     Findings: No erythema or rash.  Neurological:     Mental Status: He is alert and oriented to person, place, and time. Mental status is at baseline.     Cranial Nerves: No cranial nerve deficit.     Coordination: Coordination normal.  Psychiatric:        Mood and Affect: Mood normal.      LABORATORY DATA:  I have reviewed the data as listed Lab Results  Component Value Date   WBC 4.7 10/06/2020   HGB 13.4 10/06/2020   HCT 41.1 10/06/2020   MCV 88.0 10/06/2020   PLT 188 10/06/2020   Recent Labs    02/04/20 1250 06/03/20 0951 10/06/20 1107  NA 141 140 141  K 4.7 4.3 3.9  CL 102 101 104  CO2 30 31 34*  GLUCOSE 168* 103* 104*  BUN 20 20 20   CREATININE 0.95 0.92 1.05  CALCIUM 9.1 9.0 8.7*  GFRNONAA >60 >60 >60  GFRAA >60 >60  --   PROT 6.9 7.0 6.8  ALBUMIN 4.2 4.1 4.1  AST 21 20 20   ALT 12 13 12   ALKPHOS 65 65 60  BILITOT 0.4 0.6 0.5   RADIOGRAPHIC STUDIES: I have personally reviewed the radiological images as listed and agreed with the findings in the report. 05/18/2018 bone scan negative for metastatic disease 08/01/2018 CT chest abdomen pelvis showed no evidence of metastatic disease.  Brachytherapy seeds in the prostate gland no  findings of local regional adenopathy or metastatic disease.  Chronic findings of advanced atherosclerotic calcifications of thoracic and abdominal aorta and branch vessels.  Including 3 vessel  coronary artery calcification.   ASSESSMENT & PLAN:  1. Malignant neoplasm of prostate ([place])   2. Need for prophylactic vaccination and inoculation against influenza    # Prostate Cancer with biochemical recurrence, asymptomatic, castration sensitive.  Patient has been on androgen deprivation therapy with Lupron every 4 months PSA is slowly trending up. Again we discussed the possibility that probably he is in the process of developing castration resistant prostate cancer. Clinically he is asymptomatic .  He expresses his wish of not to proceed with any IV chemotherapy, or oral antineoplasm treatments at this point. I recommend watchful waiting.  I will hold off additional images work-up given that he is not willing to proceed with any aggressive treatment at this point. Continue androgen deprivation therapy.  Patient will proceed with Lupron today .  He will follow up in 4 months.   Orders Placed This Encounter  Procedures  . CBC with Differential/Platelet    Standing Status:   Future    Standing Expiration Date:   10/07/2021  . Comprehensive metabolic panel    Standing Status:   Future    Standing Expiration Date:   10/07/2021  . PSA    Standing Status:   Future    Standing Expiration Date:   10/07/2021  . SCHEDULING COMMUNICATION INJECTION    Schedule 1 hour injection appointment every 4 months   Sean Server, MD, PhD Hematology Oncology [facility] at [facility] Pager- [phone] 10/07/2020

## 2020-12-17 ENCOUNTER — Telehealth: Payer: Self-pay | Admitting: Family Medicine

## 2020-12-17 NOTE — Telephone Encounter (Signed)
Pt called stating that he has a head cold. Pt is requesting to have an antibiotic sent in for him. Pt denied an appt. Please advise.      [address] - [place] - [address]  [address] [place] [address]  Phone: [phone] Fax: [phone]  Hours: Not open 24 hours

## 2021-02-06 ENCOUNTER — Inpatient Hospital Stay: Payer: PPO | Attending: Oncology

## 2021-02-06 ENCOUNTER — Other Ambulatory Visit: Payer: Self-pay

## 2021-02-06 DIAGNOSIS — C61 Malignant neoplasm of prostate: Secondary | ICD-10-CM | POA: Diagnosis not present

## 2021-02-06 DIAGNOSIS — Z5111 Encounter for antineoplastic chemotherapy: Secondary | ICD-10-CM | POA: Diagnosis not present

## 2021-02-06 DIAGNOSIS — Z79899 Other long term (current) drug therapy: Secondary | ICD-10-CM | POA: Diagnosis not present

## 2021-02-06 LAB — CBC WITH DIFFERENTIAL/PLATELET
Abs Immature Granulocytes: 0.05 10*3/uL (ref 0.00–0.07)
Basophils Absolute: 0 10*3/uL (ref 0.0–0.1)
Basophils Relative: 0 %
Eosinophils Absolute: 0 10*3/uL (ref 0.0–0.5)
Eosinophils Relative: 1 %
HCT: 41.3 % (ref 39.0–52.0)
Hemoglobin: 13.4 g/dL (ref 13.0–17.0)
Immature Granulocytes: 1 %
Lymphocytes Relative: 44 %
Lymphs Abs: 2.1 10*3/uL (ref 0.7–4.0)
MCH: 28.5 pg (ref 26.0–34.0)
MCHC: 32.4 g/dL (ref 30.0–36.0)
MCV: 87.7 fL (ref 80.0–100.0)
Monocytes Absolute: 0.6 10*3/uL (ref 0.1–1.0)
Monocytes Relative: 13 %
Neutro Abs: 1.9 10*3/uL (ref 1.7–7.7)
Neutrophils Relative %: 41 %
Platelets: 202 10*3/uL (ref 150–400)
RBC: 4.71 MIL/uL (ref 4.22–5.81)
RDW: 13.7 % (ref 11.5–15.5)
WBC: 4.7 10*3/uL (ref 4.0–10.5)
nRBC: 0 % (ref 0.0–0.2)

## 2021-02-06 LAB — COMPREHENSIVE METABOLIC PANEL
ALT: 9 U/L (ref 0–44)
AST: 18 U/L (ref 15–41)
Albumin: 4.3 g/dL (ref 3.5–5.0)
Alkaline Phosphatase: 60 U/L (ref 38–126)
Anion gap: 9 (ref 5–15)
BUN: 23 mg/dL (ref 8–23)
CO2: 31 mmol/L (ref 22–32)
Calcium: 9.3 mg/dL (ref 8.9–10.3)
Chloride: 100 mmol/L (ref 98–111)
Creatinine, Ser: 0.98 mg/dL (ref 0.61–1.24)
GFR, Estimated: >60 mL/min (ref >60)
Glucose, Bld: 100 mg/dL — ABNORMAL HIGH (ref 70–99)
Potassium: 5 mmol/L (ref 3.5–5.1)
Sodium: 140 mmol/L (ref 135–145)
Total Bilirubin: 0.6 mg/dL (ref 0.3–1.2)
Total Protein: 7.2 g/dL (ref 6.5–8.1)

## 2021-02-06 LAB — PSA: Prostatic Specific Antigen: 4.54 ng/mL — ABNORMAL HIGH (ref 0.00–4.00)

## 2021-02-09 ENCOUNTER — Other Ambulatory Visit: Payer: Self-pay

## 2021-02-09 ENCOUNTER — Inpatient Hospital Stay: Payer: PPO

## 2021-02-09 ENCOUNTER — Inpatient Hospital Stay (HOSPITAL_BASED_OUTPATIENT_CLINIC_OR_DEPARTMENT_OTHER): Payer: PPO | Admitting: Oncology

## 2021-02-09 ENCOUNTER — Encounter: Payer: Self-pay | Admitting: Oncology

## 2021-02-09 VITALS — BP 139/66 | HR 60 | Temp 96.1°F | Resp 18 | Wt 146.8 lb

## 2021-02-09 DIAGNOSIS — C61 Malignant neoplasm of prostate: Secondary | ICD-10-CM

## 2021-02-09 DIAGNOSIS — Z79818 Long term (current) use of other agents affecting estrogen receptors and estrogen levels: Secondary | ICD-10-CM

## 2021-02-09 DIAGNOSIS — Z5111 Encounter for antineoplastic chemotherapy: Secondary | ICD-10-CM | POA: Diagnosis not present

## 2021-02-09 MED ORDER — LEUPROLIDE ACETATE (4 MONTH) 30 MG ~~LOC~~ KIT
30.0000 mg | PACK | Freq: Once | SUBCUTANEOUS | Status: AC
  Administered 2021-02-09: 30 mg via SUBCUTANEOUS
  Filled 2021-02-09: qty 30

## 2021-02-09 NOTE — Progress Notes (Signed)
Patient here for follow up. NO new concerns voiced.  ?

## 2021-02-09 NOTE — Progress Notes (Signed)
Hematology/Oncology Follow up note [facility] [phone] [phone]   Patient Care Team: Chrismon, Driscilla Grammes as PCP - General (Physician Assistant) Garrel Ridgel, DPM as Consulting Physician (Podiatry) Noreene Filbert, MD as Referring Physician (Radiation Oncology) Idelle Leech, OD as Consulting Physician (Optometry) Lowell Server, MD as Consulting Physician (Oncology) REASON FOR VISIT Follow up for treatment of biochemical recurrence of prostate cancer.  HISTORY OF PRESENTING ILLNESS:  Sean Mogel. is a  85 y.o.  male with PMH listed below who was referred to me for evaluation of prostate cancer.   Cancer history dated back to 2009 when He was diagnosed with intermediate risk, Gleason 3+4, T1c prostate cancer in 2009. PSA at diagnosis was 6.1. IMRT was completed in August 2009. PSA nadir was 0.5. His PSA January 2013 was 0.8 and in January 2014 was 1.3. This was repeated in May 2014 and had increased to 1.8. He elected a repeat prostate biopsy in November 2014 after his PSA in October had increased to 2.2. This showed a focus of Gleason 3+3 adenocarcinoma from the right prostate involving less than 5%. He elected surveillance after discussing options. In May 2016, he was evaluated by Dr.Chrystal and he received salvage seed implantation with I-125. He subsequently follows with Dr.Chrystal for Lupron every 4 months since then. Achieve PSA nadir of 0.06 in 09/2016, and started to trend up and most recently trended up to 9.64.   # Lupron 30 mg on 05/10/2018, 06/14/2018 testosterone level was at 6  # Leupron 22.5mg  11/06/2018.  Delayed as patient reports feeling very fatigued and we discussed about holding Lupron.  40-month and restarted on 11/06/2018.  INTERVAL HISTORY Sean Humphres. is a 85 y.o. male who has above history reviewed by me present for follow-up of management of biochemical recurrence of prostate cancer Patient feels well.  Denies any bone pain.  Chronic SOB at baseline line.  Patient has been on androgen deprivation therapy.  Manageable hot flash.     Review of Systems  Constitutional: Negative for chills, fever, malaise/fatigue and weight loss.  HENT: Negative for sore throat.   Eyes: Negative for redness.  Respiratory: Positive for shortness of breath. Negative for cough and wheezing.   Cardiovascular: Negative for chest pain, palpitations and leg swelling.  Gastrointestinal: Negative for abdominal pain, blood in stool, nausea and vomiting.  Genitourinary: Negative for dysuria.  Musculoskeletal: Negative for myalgias.  Skin: Negative for rash.  Neurological: Negative for dizziness, tingling and tremors.  Endo/Heme/Allergies: Does not bruise/bleed easily.  Psychiatric/Behavioral: Negative for hallucinations.    MEDICAL HISTORY:  Past Medical History:  Diagnosis Date  . Arthritis   . COPD (chronic obstructive pulmonary disease) ([place])   . Elevated PSA   . Hematuria, gross   . History of hiatal hernia   . HOH (hard of hearing)   . Kidney stone   . Prostate cancer ([place])   . Shortness of breath dyspnea     SURGICAL HISTORY: Past Surgical History:  Procedure Laterality Date  . [place]  . CYSTOSCOPY  06/24/2015   Procedure: CYSTOSCOPY;  Surgeon: Hollice Espy, MD;  Location: [facility] ORS;  Service: Urology;;  . HERNIA REPAIR     left and right inguinal hernia repair  . RADIOACTIVE SEED IMPLANT N/A 06/24/2015   Procedure: RADIOACTIVE SEED IMPLANT/BRACHYTHERAPY IMPLANT;  Surgeon: Hollice Espy, MD;  Location: [facility] ORS;  Service: Urology;  Laterality: N/A;    SOCIAL HISTORY: Social History  Socioeconomic History  . Marital status: Married    Spouse name: Not on file  . Number of children: 1  . Years of education: Not on file  . Highest education level: 8th grade  Occupational History  . Occupation: retired  Tobacco Use  . Smoking status: Former Smoker    Packs/day: 1.50     Types: Cigarettes    Quit date: 05/24/1981    Years since quitting: 39.7  . Smokeless tobacco: Current User    Types: Chew  Vaping Use  . Vaping Use: Never used  Substance and Sexual Activity  . Alcohol use: Not Currently    Alcohol/week: 0.0 standard drinks  . Drug use: No  . Sexual activity: Not on file  Other Topics Concern  . Not on file  Social History Narrative  . Not on file   Social Determinants of Health   Financial Resource Strain: Not on file  Food Insecurity: Not on file  Transportation Needs: Not on file  Physical Activity: Not on file  Stress: Not on file  Social Connections: Not on file  Intimate Partner Violence: Not on file    FAMILY HISTORY: Family History  Problem Relation Age of Onset  . Cancer Brother   . Heart disease Brother   . Diabetes Brother   . Emphysema Brother   . Prostate cancer Brother   . Diabetes Sister   . Emphysema Mother   . Healthy Sister   . Healthy Sister   . Healthy Sister   . Diabetes Brother   . Diabetes Brother   . Diabetes Brother   . Healthy Brother     ALLERGIES:  is allergic to mevacor [lovastatin], niacin and related, statins, and aleve [naproxen sodium].  MEDICATIONS:  Current Outpatient Medications  Medication Sig Dispense Refill  . albuterol (VENTOLIN HFA) 108 (90 Base) MCG/ACT inhaler Reported on 05/24/2016 (Patient not taking: Reported on 02/09/2021)    . Cholecalciferol (VITAMIN D-3) 1000 UNITS CAPS Take 1 capsule by mouth daily.  (Patient not taking: Reported on 02/09/2021)    . clobetasol (TEMOVATE) 0.05 % external solution Apply 1 application topically 2 (two) times daily.  (Patient not taking: Reported on 02/09/2021)    . Coenzyme Q10 (COQ-10) 100 MG CAPS Take 100 mg by mouth daily. Reported on 02/06/2016 (Patient not taking: Reported on 02/09/2021)    . fluticasone furoate-vilanterol (BREO ELLIPTA) 100-25 MCG/INH AEPB Inhale 1 puff into the lungs daily. (Patient not taking: Reported on 02/09/2021) 14 each 0  .  Magnesium 400 MG TABS Take 1 tablet by mouth every other day.  (Patient not taking: Reported on 02/09/2021)    . magnesium oxide (MAG-OX) 400 MG tablet Take 400 mg by mouth daily. (Patient not taking: No sig reported)    . meclizine (ANTIVERT) 12.5 MG tablet Take 1 tablet (12.5 mg total) by mouth 3 (three) times daily as needed for dizziness. (Patient not taking: Reported on 02/09/2021) 30 tablet 0  . Misc Natural Products (DANDELION ROOT PO) Take by mouth. 1 BID (Patient not taking: Reported on 02/09/2021)    . Red Yeast Rice 600 MG TABS Take 1 tablet by mouth at bedtime.  (Patient not taking: No sig reported)    . triamcinolone cream (KENALOG) 0.1 %  (Patient not taking: Reported on 02/09/2021)     No current facility-administered medications for this visit.     PHYSICAL EXAMINATION: ECOG PERFORMANCE STATUS: 1 - Symptomatic but completely ambulatory Vitals:   02/09/21 1307  BP: 139/66  Pulse:  60  Resp: 18  Temp: (!) 96.1 F (35.6 C)   Filed Weights   02/09/21 1307  Weight: 146 lb 12.8 oz (66.6 kg)    Physical Exam Constitutional:      General: He is not in acute distress.    Comments: Patient walks independently.  HENT:     Head: Normocephalic and atraumatic.  Eyes:     General: No scleral icterus.    Conjunctiva/sclera: Conjunctivae normal.     Pupils: Pupils are equal, round, and reactive to light.  Cardiovascular:     Rate and Rhythm: Normal rate and regular rhythm.     Heart sounds: Normal heart sounds.  Pulmonary:     Effort: Pulmonary effort is normal. No respiratory distress.     Breath sounds: No wheezing or rales.  Chest:     Chest wall: No tenderness.  Abdominal:     General: Bowel sounds are normal. There is no distension.     Palpations: Abdomen is soft. There is no mass.     Tenderness: There is no abdominal tenderness.  Musculoskeletal:        General: No deformity. Normal range of motion.     Cervical back: Normal range of motion and neck supple.   Lymphadenopathy:     Cervical: No cervical adenopathy.  Skin:    General: Skin is warm and dry.     Findings: No erythema or rash.  Neurological:     Mental Status: He is alert and oriented to person, place, and time. Mental status is at baseline.     Cranial Nerves: No cranial nerve deficit.     Coordination: Coordination normal.  Psychiatric:        Mood and Affect: Mood normal.      LABORATORY DATA:  I have reviewed the data as listed Lab Results  Component Value Date   WBC 4.7 02/06/2021   HGB 13.4 02/06/2021   HCT 41.3 02/06/2021   MCV 87.7 02/06/2021   PLT 202 02/06/2021   Recent Labs    06/03/20 0951 10/06/20 1107 02/06/21 1125  NA 140 141 140  K 4.3 3.9 5.0  CL 101 104 100  CO2 31 34* 31  GLUCOSE 103* 104* 100*  BUN 20 20 23   CREATININE 0.92 1.05 0.98  CALCIUM 9.0 8.7* 9.3  GFRNONAA >60 >60 >60  GFRAA >60  --   --   PROT 7.0 6.8 7.2  ALBUMIN 4.1 4.1 4.3  AST 20 20 18   ALT 13 12 9   ALKPHOS 65 60 60  BILITOT 0.6 0.5 0.6   RADIOGRAPHIC STUDIES: I have personally reviewed the radiological images as listed and agreed with the findings in the report. 05/18/2018 bone scan negative for metastatic disease 08/01/2018 CT chest abdomen pelvis showed no evidence of metastatic disease.  Brachytherapy seeds in the prostate gland no findings of local regional adenopathy or metastatic disease.  Chronic findings of advanced atherosclerotic calcifications of thoracic and abdominal aorta and branch vessels.  Including 3 vessel coronary artery calcification.   ASSESSMENT & PLAN:  1. Need for prophylactic vaccination and inoculation against influenza   2. Malignant neoplasm of prostate [facility])    # Prostate Cancer with biochemical recurrence, asymptomatic, castration sensitive.  Patient has been on androgen deprivation therapy with Lupron every 4 months PSA is slowly trending up, now at 4.54. Again I discussed with him that his prostate cancer has now developed resistance  to castration and I recommend him to proceed with PET scan  for further evaluation. Consider adding oral agent.  He is not interested and prefers to keep current treatment unchanged. Continue androgen deprivation therapy.  Patient will proceed with Lupron today .  He will follow up in 4 months.   Orders Placed This Encounter  Procedures  . CBC with Differential/Platelet    Standing Status:   Future    Standing Expiration Date:   02/09/2022  . Comprehensive metabolic panel    Standing Status:   Future    Standing Expiration Date:   02/09/2022  . PSA    Standing Status:   Future    Standing Expiration Date:   02/09/2022   Oda Server, MD, PhD Hematology Oncology [facility] Pager- [phone] 02/09/2021

## 2021-03-10 ENCOUNTER — Other Ambulatory Visit: Payer: Self-pay

## 2021-03-10 ENCOUNTER — Encounter: Payer: Self-pay | Admitting: Family Medicine

## 2021-03-10 ENCOUNTER — Ambulatory Visit (INDEPENDENT_AMBULATORY_CARE_PROVIDER_SITE_OTHER): Payer: PPO | Admitting: Family Medicine

## 2021-03-10 VITALS — BP 121/71 | HR 79 | Temp 98.5°F | Ht 69.0 in | Wt 145.6 lb

## 2021-03-10 DIAGNOSIS — M7022 Olecranon bursitis, left elbow: Secondary | ICD-10-CM

## 2021-03-10 DIAGNOSIS — L409 Psoriasis, unspecified: Secondary | ICD-10-CM

## 2021-03-10 MED ORDER — TRIAMCINOLONE ACETONIDE 0.5 % EX OINT: 1.0000 "application " | TOPICAL_OINTMENT | Freq: Two times a day (BID) | CUTANEOUS | 1 refills | Status: DC

## 2021-03-10 NOTE — Patient Instructions (Signed)
Elbow Bursitis Rehab Ask your health care provider which exercises are safe for you. Do exercises exactly as told by your health care provider and adjust them as directed. It is normal to feel mild stretching, pulling, tightness, or discomfort as you do these exercises. Stop right away if you feel sudden pain or your pain gets worse. Do not begin these exercises until told by your health care provider. Stretching and range-of-motion exercises These exercises warm up your muscles and joints and improve the movement and flexibility of your elbow. The exercises also help to relieve pain and swelling. Elbow flexion, assisted 1. Stand or sit with your left / right arm at your side. 2. Use your other hand to gently push your left / right hand toward your shoulder (assisted) while bending your elbow (flexion). 3. Hold this position for __________ seconds. 4. Slowly return your left / right arm to the starting position. Repeat __________ times. Complete this exercise __________ times a day. Elbow extension, assisted 1. Lie on your back in a comfortable position that allows you to relax your arm muscles. 2. Place a folded towel under your left / right upper arm so that your elbow and shoulder are at the same height. 3. Use your other arm to raise your left / right arm (assisted) until your elbow and hand do not rest on the bed or towel. Hold your left / right arm out straight with your other hand supporting it. 4. Let the weight of your hand straighten your elbow (extension). You should feel a stretch on the inside of your elbow. ? Keep your arm and chest muscles relaxed. ? If directed, add a small wrist weight or hand weight to increase the stretch. 5. Hold this position for __________ seconds. 6. Slowly release the stretch and return to the starting position. Repeat __________ times. Complete this exercise __________ times a day. Elbow flexion, active 1. Stand or sit with your left / right elbow bent  and your palm facing in, toward your body. 2. Bend your elbow as far as you can using only your arm muscles (active flexion). 3. Hold this position for __________ seconds. 4. Slowly return to the starting position. Repeat __________ times. Complete this exercise __________ times a day. Elbow extension, active 1. Stand or sit with your left / right elbow bent and your palm facing in, toward your body. 2. Slowly straighten your elbow using only your arm muscles (active extension). Stop when you feel a gentle stretch at the front of your arm, or when your arm is straight. 3. Hold this position for __________ seconds. 4. Slowly return to the starting position. Repeat __________ times. Complete this exercise __________ times a day. Strengthening exercises These exercises build strength and endurance in your elbow. Endurance is the ability to use your muscles for a long time, even after they get tired. Elbow flexion, isometric 1. Stand or sit with your left / right arm at waist height. Your palm should face in, toward your body. 2. Place your other hand on top of your left / right forearm. Gently push down while you resist with your left / right arm (isometric flexion). ? Use about 50% effort with both arms. You may be instructed to use more and more effort with your arms each week. ? Try not to let your left / right arm move during the exercise. 3. Hold this position for __________ seconds. 4. Let your muscles relax completely before you repeat the exercise. Repeat __________ times.  Complete this exercise __________ times a day. Elbow extension, isometric 1. Stand or sit with your left / right arm at waist height. Your palm should face in, toward your body. 2. Place your other hand on the bottom of your left / right forearm. Gently push up while you resist with your left / right arm (isometric extension). ? Use about 50% effort with both arms. You may be instructed to use more and more effort with  your arms each week. ? Try not to let your left / right arm move during the exercise. 3. Hold this position for __________ seconds. 4. Let your muscles relax completely before you repeat the exercise. Repeat __________ times. Complete this exercise __________ times a day.   Biceps curls 1. Sit on a stable chair without armrests, or stand up. 2. Hold a _________ weight in your left / right hand. Your palm should face out, away from your body, at the starting position. 3. Bend your left / right elbow and move your hand up toward your shoulder. Keep your elbow at your side while you bend it. 4. Slowly return to the starting position. Repeat __________ times. Complete this exercise __________ times a day. Triceps curls 1. Lie on your back. 2. Hold a _________ weight in your left / right hand. 3. Bend your left / right elbow to a 90-degree angle (right angle), so the weight is in front of your face, over your chest, and your elbow is pointed up to the ceiling. 4. Straighten your elbow, raising your hand toward the ceiling. Use your other hand to support your left / right upper arm and to keep it still. 5. Slowly return to the starting position. Repeat __________ times. Complete this exercise __________ times a day.   This information is not intended to replace advice given to you by your health care provider. Make sure you discuss any questions you have with your health care provider. Document Revised: 03/29/2019 Document Reviewed: 12/27/2018 Elsevier Patient Education  [place].

## 2021-03-10 NOTE — Progress Notes (Signed)
Established patient visit   Patient: Sean Wood.   DOB: 09/25/1936   85 y.o. Male  MRN: [id number] Visit Date: 03/10/2021  Today's healthcare provider: Lavon Paganini, MD   Chief Complaint  Patient presents with  . Elbow Pain  I,Porsha C McClurkin,acting as a scribe for Lavon Paganini, MD.,have documented all relevant documentation on the behalf of Lavon Paganini, MD,as directed by  Lavon Paganini, MD while in the presence of Lavon Paganini, MD.  Subjective    HPI  Elbow Pain Patient presents today for left elbow pain. He reports noticing a lump on is elbow as well. He denies applying heat  Psoriasis is worsening. Wants to tried something that may help it. Does not want to see Derm  Social History   Tobacco Use  . Smoking status: Former Smoker    Packs/day: 1.50    Types: Cigarettes    Quit date: 05/24/1981    Years since quitting: 39.8  . Smokeless tobacco: Current User    Types: Chew  Vaping Use  . Vaping Use: Never used  Substance Use Topics  . Alcohol use: Not Currently    Alcohol/week: 0.0 standard drinks  . Drug use: No       Medications: Outpatient Medications Prior to Visit  Medication Sig  . albuterol (VENTOLIN HFA) 108 (90 Base) MCG/ACT inhaler Reported on 05/24/2016 (Patient not taking: No sig reported)  . Cholecalciferol (VITAMIN D-3) 1000 UNITS CAPS Take 1 capsule by mouth daily.  (Patient not taking: No sig reported)  . clobetasol (TEMOVATE) 0.05 % external solution Apply 1 application topically 2 (two) times daily.  (Patient not taking: No sig reported)  . Coenzyme Q10 (COQ-10) 100 MG CAPS Take 100 mg by mouth daily. Reported on 02/06/2016 (Patient not taking: No sig reported)  . fluticasone furoate-vilanterol (BREO ELLIPTA) 100-25 MCG/INH AEPB Inhale 1 puff into the lungs daily. (Patient not taking: No sig reported)  . Magnesium 400 MG TABS Take 1 tablet by mouth every other day.  (Patient not taking: No sig reported)  .  magnesium oxide (MAG-OX) 400 MG tablet Take 400 mg by mouth daily. (Patient not taking: No sig reported)  . meclizine (ANTIVERT) 12.5 MG tablet Take 1 tablet (12.5 mg total) by mouth 3 (three) times daily as needed for dizziness. (Patient not taking: No sig reported)  . Misc Natural Products (DANDELION ROOT PO) Take by mouth. 1 BID (Patient not taking: No sig reported)  . Red Yeast Rice 600 MG TABS Take 1 tablet by mouth at bedtime.  (Patient not taking: No sig reported)  . triamcinolone cream (KENALOG) 0.1 %  (Patient not taking: No sig reported)   No facility-administered medications prior to visit.    Review of Systems  Respiratory: Negative.   Cardiovascular: Negative.   Musculoskeletal: Positive for arthralgias and joint swelling.  Skin: Positive for rash.        Objective    BP 121/71 (BP Location: Left Arm, Patient Position: Sitting, Cuff Size: Normal)   Pulse 79   Temp 98.5 F (36.9 C) (Oral)   Ht 5\' 9"  (1.753 m)   Wt 145 lb 9.6 oz (66 kg)   SpO2 100%   BMI 21.50 kg/m     Physical Exam Constitutional:      General: He is not in acute distress.    Appearance: Normal appearance. He is not diaphoretic.  HENT:     Head: Normocephalic.  Eyes:     Conjunctiva/sclera: Conjunctivae normal.  Pulmonary:     Effort: Pulmonary effort is normal. No respiratory distress.  Musculoskeletal:     Comments: Swelling and slight tenderness over left olecranon process.  No surrounding erythema or warmth.  No drainage  Skin:    Findings: Rash (Psoriasis) present.  Neurological:     Mental Status: He is alert. Mental status is at baseline.       No results found for any visits on 03/10/21.  Assessment & Plan     1. Olecranon bursitis of left elbow -New problem -No signs of infection -Likely related to overuse -Discussed compression-wrapped in Ace bandage today in the office -Discussed ice/rest -Allergic to Aleve, so will avoid NSAIDs -Can use Tylenol as needed -Referral  to orthopedics for further evaluation and management - Ambulatory referral to Orthopedic Surgery  2. Psoriasis -Discussed that there are many treatment options for psoriasis at this time, especially as he complains about fairly large areas of plaques -Patient is not interested in pursuing this at this time -Can try topical steroids instead  Meds ordered this encounter  Medications  . triamcinolone ointment (KENALOG) 0.5 %    Sig: Apply 1 application topically 2 (two) times daily.    Dispense:  30 g    Refill:  1      No follow-ups on file.      Total time spent on today's visit was greater than 30 minutes, including both face-to-face time and nonface-to-face time personally spent on review of chart (labs and imaging), discussing labs and goals, discussing further work-up, treatment options, referrals to specialist if needed, reviewing outside records of pertinent, answering patient's questions, and coordinating care.   I, Lavon Paganini, MD, have reviewed all documentation for this visit. The documentation on 03/10/21 for the exam, diagnosis, procedures, and orders are all accurate and complete.   Tria Noguera, Dionne Bucy, MD, MPH [facility]

## 2021-03-18 DIAGNOSIS — M7022 Olecranon bursitis, left elbow: Secondary | ICD-10-CM | POA: Diagnosis not present

## 2021-04-10 ENCOUNTER — Ambulatory Visit: Payer: PPO | Admitting: Podiatry

## 2021-04-10 DIAGNOSIS — L570 Actinic keratosis: Secondary | ICD-10-CM | POA: Insufficient documentation

## 2021-04-10 DIAGNOSIS — M545 Low back pain, unspecified: Secondary | ICD-10-CM | POA: Insufficient documentation

## 2021-04-10 DIAGNOSIS — B0229 Other postherpetic nervous system involvement: Secondary | ICD-10-CM | POA: Insufficient documentation

## 2021-04-10 DIAGNOSIS — R69 Illness, unspecified: Secondary | ICD-10-CM | POA: Insufficient documentation

## 2021-06-08 ENCOUNTER — Inpatient Hospital Stay: Payer: PPO | Attending: Oncology

## 2021-06-08 DIAGNOSIS — Z79899 Other long term (current) drug therapy: Secondary | ICD-10-CM | POA: Diagnosis not present

## 2021-06-08 DIAGNOSIS — C61 Malignant neoplasm of prostate: Secondary | ICD-10-CM | POA: Diagnosis not present

## 2021-06-08 DIAGNOSIS — Z5111 Encounter for antineoplastic chemotherapy: Secondary | ICD-10-CM | POA: Insufficient documentation

## 2021-06-08 DIAGNOSIS — Z79818 Long term (current) use of other agents affecting estrogen receptors and estrogen levels: Secondary | ICD-10-CM

## 2021-06-08 LAB — CBC WITH DIFFERENTIAL/PLATELET
Abs Immature Granulocytes: 0.05 10*3/uL (ref 0.00–0.07)
Basophils Absolute: 0 10*3/uL (ref 0.0–0.1)
Basophils Relative: 0 %
Eosinophils Absolute: 0 10*3/uL (ref 0.0–0.5)
Eosinophils Relative: 0 %
HCT: 41 % (ref 39.0–52.0)
Hemoglobin: 13.3 g/dL (ref 13.0–17.0)
Immature Granulocytes: 1 %
Lymphocytes Relative: 49 %
Lymphs Abs: 2.4 10*3/uL (ref 0.7–4.0)
MCH: 28.1 pg (ref 26.0–34.0)
MCHC: 32.4 g/dL (ref 30.0–36.0)
MCV: 86.5 fL (ref 80.0–100.0)
Monocytes Absolute: 0.7 10*3/uL (ref 0.1–1.0)
Monocytes Relative: 14 %
Neutro Abs: 1.7 10*3/uL (ref 1.7–7.7)
Neutrophils Relative %: 36 %
Platelets: 172 10*3/uL (ref 150–400)
RBC: 4.74 MIL/uL (ref 4.22–5.81)
RDW: 13.5 % (ref 11.5–15.5)
WBC: 4.8 10*3/uL (ref 4.0–10.5)
nRBC: 0 % (ref 0.0–0.2)

## 2021-06-08 LAB — COMPREHENSIVE METABOLIC PANEL
ALT: 9 U/L (ref 0–44)
AST: 18 U/L (ref 15–41)
Albumin: 4.1 g/dL (ref 3.5–5.0)
Alkaline Phosphatase: 59 U/L (ref 38–126)
Anion gap: 8 (ref 5–15)
BUN: 21 mg/dL (ref 8–23)
CO2: 32 mmol/L (ref 22–32)
Calcium: 9.1 mg/dL (ref 8.9–10.3)
Chloride: 99 mmol/L (ref 98–111)
Creatinine, Ser: 0.81 mg/dL (ref 0.61–1.24)
GFR, Estimated: >60 mL/min (ref >60)
Glucose, Bld: 103 mg/dL — ABNORMAL HIGH (ref 70–99)
Potassium: 4 mmol/L (ref 3.5–5.1)
Sodium: 139 mmol/L (ref 135–145)
Total Bilirubin: 0.7 mg/dL (ref 0.3–1.2)
Total Protein: 7.1 g/dL (ref 6.5–8.1)

## 2021-06-08 LAB — PSA: Prostatic Specific Antigen: 4.88 ng/mL — ABNORMAL HIGH (ref 0.00–4.00)

## 2021-06-09 LAB — TESTOSTERONE: Testosterone: 4 ng/dL — ABNORMAL LOW (ref 264–916)

## 2021-06-10 ENCOUNTER — Encounter: Payer: Self-pay | Admitting: Oncology

## 2021-06-10 ENCOUNTER — Inpatient Hospital Stay: Payer: PPO

## 2021-06-10 ENCOUNTER — Inpatient Hospital Stay (HOSPITAL_BASED_OUTPATIENT_CLINIC_OR_DEPARTMENT_OTHER): Payer: PPO | Admitting: Oncology

## 2021-06-10 VITALS — BP 107/70 | HR 67 | Temp 96.8°F | Resp 16 | Wt 142.3 lb

## 2021-06-10 DIAGNOSIS — C61 Malignant neoplasm of prostate: Secondary | ICD-10-CM

## 2021-06-10 DIAGNOSIS — Z79818 Long term (current) use of other agents affecting estrogen receptors and estrogen levels: Secondary | ICD-10-CM | POA: Diagnosis not present

## 2021-06-10 DIAGNOSIS — Z5111 Encounter for antineoplastic chemotherapy: Secondary | ICD-10-CM | POA: Diagnosis not present

## 2021-06-10 MED ORDER — LEUPROLIDE ACETATE (4 MONTH) 30 MG ~~LOC~~ KIT
30.0000 mg | PACK | Freq: Once | SUBCUTANEOUS | Status: AC
  Administered 2021-06-10: 30 mg via SUBCUTANEOUS
  Filled 2021-06-10: qty 30

## 2021-06-10 NOTE — Progress Notes (Signed)
Hematology/Oncology Follow up note [facility] Telephone:(336) [phone] Fax:(336) [phone]   Patient Care Team: Chrismon, Driscilla Grammes as PCP - General (Physician Assistant) Garrel Ridgel, DPM as Consulting Physician (Podiatry) Noreene Filbert, MD as Referring Physician (Radiation Oncology) Idelle Leech, OD as Consulting Physician (Optometry) Taysen Server, MD as Consulting Physician (Oncology) REASON FOR VISIT Follow up for treatment of biochemical recurrence of prostate cancer.  HISTORY OF PRESENTING ILLNESS:  Sean Eudy. is a  85 y.o.  male with PMH listed below who was referred to me for evaluation of prostate cancer.   Cancer history dated back to 2009 when He was diagnosed with intermediate risk, Gleason 3+4, T1c prostate cancer in 2009. PSA at diagnosis was 6.1. IMRT was completed in August 2009. PSA nadir was 0.5. His PSA January 2013 was 0.8 and in January 2014 was 1.3. This was repeated in May 2014 and had increased to 1.8. He elected a repeat prostate biopsy in November 2014 after his PSA in October had increased to 2.2. This showed a focus of Gleason 3+3 adenocarcinoma from the right prostate involving less than 5%. He elected surveillance after discussing options. In May 2016, he was evaluated by Dr.Chrystal and he received salvage seed implantation with I-125. He subsequently follows with Dr.Chrystal for Lupron every 4 months since then. Achieve PSA nadir of 0.06 in 09/2016, and started to trend up and most recently trended up to 9.64.   # Lupron 30 mg on 05/10/2018, 06/14/2018 testosterone level was at 6  # Leupron 22.5mg  11/06/2018.  Delayed as patient reports feeling very fatigued and we discussed about holding Lupron.  26-month and restarted on 11/06/2018.  INTERVAL HISTORY Sean Meister. is a 85 y.o. 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  Chronic shortness of breath Patient feels well. Denies any bone pain.  Chronic SOB due to emphysema.  Symptoms are at the baseline.  Denies any new bone pain..  Patient has been on androgen deprivation therapy.  Manageable hot flash.     Review of Systems  Constitutional:  Positive for malaise/fatigue. Negative for chills, fever and weight loss.  HENT:  Negative for sore throat.   Eyes:  Negative for redness.  Respiratory:  Positive for shortness of breath. Negative for cough and wheezing.   Cardiovascular:  Negative for chest pain, palpitations and leg swelling.  Gastrointestinal:  Negative for abdominal pain, blood in stool, nausea and vomiting.  Genitourinary:  Negative for dysuria.  Musculoskeletal:  Negative for myalgias.  Skin:  Negative for rash.  Neurological:  Negative for dizziness, tingling and tremors.  Endo/Heme/Allergies:  Does not bruise/bleed easily.  Psychiatric/Behavioral:  Negative for hallucinations.    MEDICAL HISTORY:  Past Medical History:  Diagnosis Date   Arthritis    COPD (chronic obstructive pulmonary disease) (HCC)    Elevated PSA    Hematuria, gross    History of hiatal hernia    HOH (hard of hearing)    Kidney stone    Prostate cancer ([place])    Shortness of breath dyspnea     SURGICAL HISTORY: Past Surgical History:  Procedure Laterality Date   [place]  06/24/2015   Procedure: CYSTOSCOPY;  Surgeon: Hollice Espy, MD;  Location: [facility] ORS;  Service: Urology;;   HERNIA REPAIR  left and right inguinal hernia repair   RADIOACTIVE SEED IMPLANT N/A 06/24/2015   Procedure: RADIOACTIVE SEED IMPLANT/BRACHYTHERAPY IMPLANT;  Surgeon: Hollice Espy, MD;  Location: [facility] ORS;  Service: Urology;  Laterality: N/A;    SOCIAL HISTORY: Social History   Socioeconomic History   Marital status: Married    Spouse name: Not on file   Number of children: 1   Years of  education: Not on file   Highest education level: 8th grade  Occupational History   Occupation: retired  Tobacco Use   Smoking status: Former    Packs/day: 1.50    Pack years: 0.00    Types: Cigarettes    Quit date: 05/24/1981    Years since quitting: 40.0   Smokeless tobacco: Current    Types: Chew  Vaping Use   Vaping Use: Never used  Substance and Sexual Activity   Alcohol use: Not Currently    Alcohol/week: 0.0 standard drinks   Drug use: No   Sexual activity: Not on file  Other Topics Concern   Not on file  Social History Narrative   Not on file   Social Determinants of Health   Financial Resource Strain: Not on file  Food Insecurity: Not on file  Transportation Needs: Not on file  Physical Activity: Not on file  Stress: Not on file  Social Connections: Not on file  Intimate Partner Violence: Not on file    FAMILY HISTORY: Family History  Problem Relation Age of Onset   Cancer Brother    Heart disease Brother    Diabetes Brother    Emphysema Brother    Prostate cancer Brother    Diabetes Sister    Emphysema Mother    Healthy Sister    Healthy Sister    Healthy Sister    Diabetes Brother    Diabetes Brother    Diabetes Brother    Healthy Brother     ALLERGIES:  is allergic to [employer] [lovastatin], niacin and related, statins, and aleve [naproxen sodium].  MEDICATIONS:  Current Outpatient Medications  Medication Sig Dispense Refill   triamcinolone ointment (KENALOG) 0.5 % Apply 1 application topically 2 (two) times daily. 30 g 1   No current facility-administered medications for this visit.     PHYSICAL EXAMINATION: ECOG PERFORMANCE STATUS: 1 - Symptomatic but completely ambulatory Vitals:   06/10/21 1308  BP: 107/70  Pulse: 67  Resp: 16  Temp: (!) 96.8 F (36 C)   There were no vitals filed for this visit.   Physical Exam Constitutional:      General: He is not in acute distress.    Comments: Patient walks independently.  HENT:      Head: Normocephalic and atraumatic.  Eyes:     General: No scleral icterus.    Conjunctiva/sclera: Conjunctivae normal.     Pupils: Pupils are equal, round, and reactive to light.  Cardiovascular:     Rate and Rhythm: Normal rate and regular rhythm.     Heart sounds: Normal heart sounds.  Pulmonary:     Effort: Pulmonary effort is normal. No respiratory distress.     Breath sounds: No wheezing or rales.  Chest:     Chest wall: No tenderness.  Abdominal:     General: Bowel sounds are normal. There is no distension.     Palpations: Abdomen is soft. There is no mass.     Tenderness: There is no abdominal tenderness.  Musculoskeletal:        General: No deformity. Normal  range of motion.     Cervical back: Normal range of motion and neck supple.  Lymphadenopathy:     Cervical: No cervical adenopathy.  Skin:    General: Skin is warm and dry.     Findings: No erythema or rash.  Neurological:     Mental Status: He is alert and oriented to person, place, and time. Mental status is at baseline.     Cranial Nerves: No cranial nerve deficit.     Coordination: Coordination normal.  Psychiatric:        Mood and Affect: Mood normal.     LABORATORY DATA:  I have reviewed the data as listed Lab Results  Component Value Date   WBC 4.8 06/08/2021   HGB 13.3 06/08/2021   HCT 41.0 06/08/2021   MCV 86.5 06/08/2021   PLT 172 06/08/2021   Recent Labs    10/06/20 1107 02/06/21 1125 06/08/21 1243  NA 141 140 139  K 3.9 5.0 4.0  CL 104 100 99  CO2 34* 31 32  GLUCOSE 104* 100* 103*  BUN 20 23 21   CREATININE 1.05 0.98 0.81  CALCIUM 8.7* 9.3 9.1  GFRNONAA >60 >60 >60  PROT 6.8 7.2 7.1  ALBUMIN 4.1 4.3 4.1  AST 20 18 18   ALT 12 9 9   ALKPHOS 60 60 59  BILITOT 0.5 0.6 0.7    RADIOGRAPHIC STUDIES: I have personally reviewed the radiological images as listed and agreed with the findings in the report. 05/18/2018 bone scan negative for metastatic disease 08/01/2018 CT chest abdomen  pelvis showed no evidence of metastatic disease.  Brachytherapy seeds in the prostate gland no findings of local regional adenopathy or metastatic disease.  Chronic findings of advanced atherosclerotic calcifications of thoracic and abdominal aorta and branch vessels.  Including 3 vessel coronary artery calcification.   ASSESSMENT & PLAN:  1. Malignant neoplasm of prostate ([place])   2. Encounter for monitoring androgen deprivation therapy    # Prostate Cancer with biochemical recurrence, asymptomatic, castration sensitive.  Patient has been on androgen deprivation therapy with Lupron every 4 months PSA is slowly trending up, now at 4.88. Again I discussed with him that his prostate cancer has now developed resistance to castration and I recommend him to proceed with PSMA PET scan for further evaluation. Consider adding oral agent.  Patient agrees with PSMA PET scan in 4 months and further discussion of oral agent at that point. Continue androgen deprivation therapy.  Patient will proceed with Lupron today He will follow up in 4 months.  Lab MD CBC CBP PSA prior to the visit.   Orders Placed This Encounter  Procedures   NM PET (PSMA) SKULL TO MID THIGH    Standing Status:   Future    Standing Expiration Date:   06/10/2022    Order Specific Question:   If indicated for the ordered procedure, I authorize the administration of a radiopharmaceutical per Radiology protocol    Answer:   Yes    Order Specific Question:   Preferred imaging location?    Answer:   [facility]   Joran Server, MD, PhD Hematology Oncology [facility] at [facility] Pager- [phone] 06/10/2021

## 2021-06-10 NOTE — Progress Notes (Signed)
Patient denies new problems/concerns today.   °

## 2021-10-06 ENCOUNTER — Other Ambulatory Visit: Payer: Self-pay

## 2021-10-06 ENCOUNTER — Other Ambulatory Visit: Payer: PPO

## 2021-10-06 ENCOUNTER — Ambulatory Visit
Admission: RE | Admit: 2021-10-06 | Discharge: 2021-10-06 | Disposition: A | Discharge status: Home / Self Care | Payer: PPO | Source: Ambulatory Visit | Attending: Oncology | Admitting: Oncology

## 2021-10-06 ENCOUNTER — Inpatient Hospital Stay: Payer: PPO | Attending: Oncology

## 2021-10-06 DIAGNOSIS — C61 Malignant neoplasm of prostate: Secondary | ICD-10-CM | POA: Insufficient documentation

## 2021-10-06 DIAGNOSIS — I700 Atherosclerosis of aorta: Secondary | ICD-10-CM | POA: Insufficient documentation

## 2021-10-06 DIAGNOSIS — Z5189 Encounter for other specified aftercare: Secondary | ICD-10-CM | POA: Insufficient documentation

## 2021-10-06 DIAGNOSIS — J439 Emphysema, unspecified: Secondary | ICD-10-CM | POA: Diagnosis not present

## 2021-10-06 DIAGNOSIS — Z5111 Encounter for antineoplastic chemotherapy: Secondary | ICD-10-CM | POA: Insufficient documentation

## 2021-10-06 DIAGNOSIS — I2510 Atherosclerotic heart disease of native coronary artery without angina pectoris: Secondary | ICD-10-CM | POA: Diagnosis not present

## 2021-10-06 DIAGNOSIS — R972 Elevated prostate specific antigen [PSA]: Secondary | ICD-10-CM | POA: Diagnosis not present

## 2021-10-06 LAB — COMPREHENSIVE METABOLIC PANEL
ALT: 9 U/L (ref 0–44)
AST: 18 U/L (ref 15–41)
Albumin: 4.2 g/dL (ref 3.5–5.0)
Alkaline Phosphatase: 64 U/L (ref 38–126)
Anion gap: 5 (ref 5–15)
BUN: 19 mg/dL (ref 8–23)
CO2: 32 mmol/L (ref 22–32)
Calcium: 9.1 mg/dL (ref 8.9–10.3)
Chloride: 103 mmol/L (ref 98–111)
Creatinine, Ser: 0.98 mg/dL (ref 0.61–1.24)
GFR, Estimated: >60 mL/min (ref >60)
Glucose, Bld: 119 mg/dL — ABNORMAL HIGH (ref 70–99)
Potassium: 4.5 mmol/L (ref 3.5–5.1)
Sodium: 140 mmol/L (ref 135–145)
Total Bilirubin: 0.4 mg/dL (ref 0.3–1.2)
Total Protein: 7.1 g/dL (ref 6.5–8.1)

## 2021-10-06 LAB — CBC WITH DIFFERENTIAL/PLATELET
Abs Immature Granulocytes: 0.03 10*3/uL (ref 0.00–0.07)
Basophils Absolute: 0 10*3/uL (ref 0.0–0.1)
Basophils Relative: 0 %
Eosinophils Absolute: 0 10*3/uL (ref 0.0–0.5)
Eosinophils Relative: 0 %
HCT: 41.9 % (ref 39.0–52.0)
Hemoglobin: 13.3 g/dL (ref 13.0–17.0)
Immature Granulocytes: 1 %
Lymphocytes Relative: 46 %
Lymphs Abs: 1.6 10*3/uL (ref 0.7–4.0)
MCH: 28.1 pg (ref 26.0–34.0)
MCHC: 31.7 g/dL (ref 30.0–36.0)
MCV: 88.6 fL (ref 80.0–100.0)
Monocytes Absolute: 0.5 10*3/uL (ref 0.1–1.0)
Monocytes Relative: 15 %
Neutro Abs: 1.4 10*3/uL — ABNORMAL LOW (ref 1.7–7.7)
Neutrophils Relative %: 38 %
Platelets: 162 10*3/uL (ref 150–400)
RBC: 4.73 MIL/uL (ref 4.22–5.81)
RDW: 13.6 % (ref 11.5–15.5)
WBC: 3.5 10*3/uL — ABNORMAL LOW (ref 4.0–10.5)
nRBC: 0 % (ref 0.0–0.2)

## 2021-10-06 LAB — PSA: Prostatic Specific Antigen: 6.73 ng/mL — ABNORMAL HIGH (ref 0.00–4.00)

## 2021-10-06 MED ORDER — PIFLIFOLASTAT F 18 (PYLARIFY) INJECTION
9.0000 | Freq: Once | INTRAVENOUS | Status: AC
  Administered 2021-10-06: 9.2 via INTRAVENOUS

## 2021-10-07 ENCOUNTER — Encounter: Payer: Self-pay | Admitting: Oncology

## 2021-10-07 ENCOUNTER — Inpatient Hospital Stay: Payer: PPO

## 2021-10-07 ENCOUNTER — Inpatient Hospital Stay (HOSPITAL_BASED_OUTPATIENT_CLINIC_OR_DEPARTMENT_OTHER): Payer: PPO | Admitting: Oncology

## 2021-10-07 VITALS — BP 120/57 | HR 73 | Temp 97.6°F | Resp 16 | Wt 145.0 lb

## 2021-10-07 DIAGNOSIS — Z5111 Encounter for antineoplastic chemotherapy: Secondary | ICD-10-CM | POA: Diagnosis not present

## 2021-10-07 DIAGNOSIS — Z79818 Long term (current) use of other agents affecting estrogen receptors and estrogen levels: Secondary | ICD-10-CM

## 2021-10-07 DIAGNOSIS — Z5189 Encounter for other specified aftercare: Secondary | ICD-10-CM | POA: Insufficient documentation

## 2021-10-07 DIAGNOSIS — Z7189 Other specified counseling: Secondary | ICD-10-CM

## 2021-10-07 DIAGNOSIS — C61 Malignant neoplasm of prostate: Secondary | ICD-10-CM | POA: Insufficient documentation

## 2021-10-07 MED ORDER — LEUPROLIDE ACETATE (4 MONTH) 30 MG ~~LOC~~ KIT
30.0000 mg | PACK | Freq: Once | SUBCUTANEOUS | Status: AC
  Administered 2021-10-07: 30 mg via SUBCUTANEOUS
  Filled 2021-10-07: qty 30

## 2021-10-07 MED ORDER — LEUPROLIDE ACETATE (4 MONTH) 30 MG ~~LOC~~ KIT: 30.0000 mg | PACK | Freq: Once | SUBCUTANEOUS | Status: DC

## 2021-10-07 NOTE — Progress Notes (Signed)
Hematology/Oncology Follow up note [facility] Telephone:(336) [phone] Fax:(336) [phone]   Patient Care Team: Chrismon, Vickki Muff, [name] (Inactive) as PCP - General (Physician Assistant) Garrel Ridgel, DPM as Consulting Physician (Podiatry) Noreene Filbert, MD as Referring Physician (Radiation Oncology) Idelle Leech, OD as Consulting Physician (Optometry) Jemery Server, MD as Consulting Physician (Oncology) REASON FOR VISIT Follow up for treatment of biochemical recurrence of prostate cancer.  HISTORY OF PRESENTING ILLNESS:  Sean Marcella. is a  85 y.o.  male with PMH listed below who was referred to me for evaluation of prostate cancer.   Cancer history dated back to 2009 when He was diagnosed with intermediate risk, Gleason 3+4, T1c prostate cancer in 2009. PSA at diagnosis was 6.1. IMRT was completed in August 2009. PSA nadir was 0.5. His PSA January 2013 was 0.8 and in January 2014 was 1.3. This was repeated in May 2014 and had increased to 1.8. He elected a repeat prostate biopsy in November 2014 after his PSA in October had increased to 2.2. This showed a focus of Gleason 3+3 adenocarcinoma from the right prostate involving less than 5%. He elected surveillance after discussing options. In May 2016, he was evaluated by Dr.Chrystal and he received salvage seed implantation with I-125. He subsequently follows with Dr.Chrystal for Lupron every 4 months since then. Achieve PSA nadir of 0.06 in 09/2016, and started to trend up and most recently trended up to 9.64.   # Lupron 30 mg on 05/10/2018, 06/14/2018 testosterone level was at 6  # Leupron 22.5mg  11/06/2018.  Delayed as patient reports feeling very fatigued and we discussed about holding Lupron.  75-month and restarted on 11/06/2018.  INTERVAL HISTORY Sean Wood. is a 85 y.o. male who has above history reviewed by me present for follow-up of management of biochemical recurrence of prostate cancer Patient  feels exhausted due to being a caregiver for wife who has dementia .  Chronic shortness of breath due to emphysema. He feels well.  Denies any bone pain. Patient is the caregiver for his wife who has dementia. Patient is on androgen deprivation therapy with manageable hot flash.     Review of Systems  Constitutional:  Positive for malaise/fatigue. Negative for chills, fever and weight loss.  HENT:  Negative for sore throat.   Eyes:  Negative for redness.  Respiratory:  Positive for shortness of breath. Negative for cough and wheezing.   Cardiovascular:  Negative for chest pain, palpitations and leg swelling.  Gastrointestinal:  Negative for abdominal pain, blood in stool, nausea and vomiting.  Genitourinary:  Negative for dysuria.  Musculoskeletal:  Negative for myalgias.  Skin:  Negative for rash.  Neurological:  Negative for dizziness, tingling and tremors.  Endo/Heme/Allergies:  Does not bruise/bleed easily.  Psychiatric/Behavioral:  Negative for hallucinations.    MEDICAL HISTORY:  Past Medical History:  Diagnosis Date   Arthritis    COPD (chronic obstructive pulmonary disease) (HCC)    Elevated PSA    Hematuria, gross    History of hiatal hernia    HOH (hard of hearing)    Kidney stone    Prostate cancer ([place])    Shortness of breath dyspnea     SURGICAL HISTORY: Past Surgical History:  Procedure Laterality Date   [place]  06/24/2015   Procedure: CYSTOSCOPY;  Surgeon: Hollice Espy, MD;  Location: [facility] ORS;  Service: Urology;;   HERNIA REPAIR     left and right  inguinal hernia repair   RADIOACTIVE SEED IMPLANT N/A 06/24/2015   Procedure: RADIOACTIVE SEED IMPLANT/BRACHYTHERAPY IMPLANT;  Surgeon: Hollice Espy, MD;  Location: [facility] ORS;  Service: Urology;  Laterality: N/A;    SOCIAL HISTORY: Social History   Socioeconomic History   Marital status: Married    Spouse name: Not on file   Number of children: 1   Years of education: Not on  file   Highest education level: 8th grade  Occupational History   Occupation: retired  Tobacco Use   Smoking status: Former    Packs/day: 1.50    Types: Cigarettes    Quit date: 05/24/1981    Years since quitting: 40.4   Smokeless tobacco: Current    Types: Chew  Vaping Use   Vaping Use: Never used  Substance and Sexual Activity   Alcohol use: Not Currently    Alcohol/week: 0.0 standard drinks   Drug use: No   Sexual activity: Not on file  Other Topics Concern   Not on file  Social History Narrative   Not on file   Social Determinants of Health   Financial Resource Strain: Not on file  Food Insecurity: Not on file  Transportation Needs: Not on file  Physical Activity: Not on file  Stress: Not on file  Social Connections: Not on file  Intimate Partner Violence: Not on file    FAMILY HISTORY: Family History  Problem Relation Age of Onset   Cancer Brother    Heart disease Brother    Diabetes Brother    Emphysema Brother    Prostate cancer Brother    Diabetes Sister    Emphysema Mother    Healthy Sister    Healthy Sister    Healthy Sister    Diabetes Brother    Diabetes Brother    Diabetes Brother    Healthy Brother     ALLERGIES:  is allergic to [employer] [lovastatin], niacin and related, statins, and aleve [naproxen sodium].  MEDICATIONS:  No current outpatient medications on file.   No current facility-administered medications for this visit.     PHYSICAL EXAMINATION: ECOG PERFORMANCE STATUS: 1 - Symptomatic but completely ambulatory Vitals:   10/07/21 0916  BP: (!) 120/57  Pulse: 73  Resp: 16  Temp: 97.6 F (36.4 C)  SpO2: 100%   Filed Weights   10/07/21 0916  Weight: 145 lb (65.8 kg)     Physical Exam Constitutional:      General: He is not in acute distress.    Comments: Patient walks independently.  HENT:     Head: Normocephalic and atraumatic.  Eyes:     General: No scleral icterus.    Conjunctiva/sclera: Conjunctivae normal.      Pupils: Pupils are equal, round, and reactive to light.  Cardiovascular:     Rate and Rhythm: Normal rate and regular rhythm.     Heart sounds: Normal heart sounds.  Pulmonary:     Effort: Pulmonary effort is normal. No respiratory distress.     Breath sounds: No wheezing or rales.     Comments: Decreased breath sound bilaterally Chest:     Chest wall: No tenderness.  Abdominal:     General: Bowel sounds are normal. There is no distension.     Palpations: Abdomen is soft. There is no mass.     Tenderness: There is no abdominal tenderness.  Musculoskeletal:        General: No deformity. Normal range of motion.     Cervical back: Normal range of  motion and neck supple.  Lymphadenopathy:     Cervical: No cervical adenopathy.  Skin:    General: Skin is warm and dry.     Findings: No erythema or rash.  Neurological:     Mental Status: He is alert and oriented to person, place, and time. Mental status is at baseline.     Cranial Nerves: No cranial nerve deficit.     Coordination: Coordination normal.  Psychiatric:        Mood and Affect: Mood normal.     LABORATORY DATA:  I have reviewed the data as listed Lab Results  Component Value Date   WBC 3.5 (L) 10/06/2021   HGB 13.3 10/06/2021   HCT 41.9 10/06/2021   MCV 88.6 10/06/2021   PLT 162 10/06/2021   Recent Labs    02/06/21 1125 06/08/21 1243 10/06/21 1037  NA 140 139 140  K 5.0 4.0 4.5  CL 100 99 103  CO2 31 32 32  GLUCOSE 100* 103* 119*  BUN 23 21 19   CREATININE 0.98 0.81 0.98  CALCIUM 9.3 9.1 9.1  GFRNONAA >60 >60 >60  PROT 7.2 7.1 7.1  ALBUMIN 4.3 4.1 4.2  AST 18 18 18   ALT 9 9 9   ALKPHOS 60 59 64  BILITOT 0.6 0.7 0.4    RADIOGRAPHIC STUDIES: I have personally reviewed the radiological images as listed and agreed with the findings in the report. NM PET (PSMA) SKULL TO MID THIGH  Result Date: 10/07/2021 CLINICAL DATA:  History of prostate neoplasm diagnosed in 2009. Initially treated with IMRT,  subsequent salvage therapy in 2016 with seed implantation. Lowest PSA 0.06 with most recent elevated PSA of 9.64. EXAM: NUCLEAR MEDICINE PET SKULL BASE TO THIGH TECHNIQUE: 9.2 mCi F18 Piflufolastat (Pylarify) was injected intravenously. Full-ring PET imaging was performed from the skull base to thigh after the radiotracer. CT data was obtained and used for attenuation correction and anatomic localization. COMPARISON:  Chest abdomen pelvis CT from 2019. FINDINGS: NECK No radiotracer activity in neck lymph nodes. Incidental CT finding: None CHEST No radiotracer accumulation within mediastinal or hilar lymph nodes. No suspicious pulmonary nodules on the CT scan. Incidental CT finding: Calcified atheromatous plaque in the thoracic aorta. Three-vessel coronary artery disease. Normal heart size. No substantial pericardial effusion. Normal caliber of central pulmonary vessels. Limited assessment of cardiovascular structures given lack of intravenous contrast. Signs of pulmonary emphysema that is worse towards the lung apices. Airways are patent. Biapical scarring is noted. ABDOMEN/PELVIS Prostate: Marked radiotracer accumulation in the prostate gland/bed. Maximum SUV of 21.5 encompassing the entire region of the prostate area measuring approximately 2.1 x 2.3 cm (image 254/3). Radiotracer tracks towards the base of the urinary bladder which show some distortion. Lymph nodes: Tiny LEFT pelvic sidewall lymph node (image 106/6 and image 224/3 with a maximum SUV of 2.37. This measures approximately 3-4 mm. Liver: No evidence of liver metastasis Incidental CT finding: No acute findings relative to liver, gallbladder, spleen, pancreas, adrenal glands or kidneys. Gastrointestinal tract is unremarkable aside from colonic diverticulosis. Aortic atherosclerosis without aneurysm. No adenopathy by size criteria in the retroperitoneum or in the pelvis. Brachytherapy seeds in the prostate gland. SKELETON No radiotracer accumulation to  suggest skeletal metastasis. IMPRESSION: Marked radiotracer accumulation with elevated SUV in the prostate/prostate bed tracking towards the bladder base with distortion of the urinary bladder. Signs of brachytherapy in the prostate as well. No definite signs of extraprostatic disease at this time. Single lymph node in the LEFT pelvis with only  minimally elevated SUV is equivocal, attention on follow-up. Aortic Atherosclerosis (ICD10-I70.0). Electronically Signed   By: Zetta Bills M.D.   On: 10/07/2021 09:16     ASSESSMENT & PLAN:  1. Malignant neoplasm of prostate ([place])   2. Encounter for monitoring androgen deprivation therapy   3. Goals of care, counseling/discussion    # Prostate Cancer with biochemical recurrence, asymptomatic, castration resistant nonmetastatic.  PSA continues to trend up to 6 10/06/2021, PSMA PET scan showed marked radiotracer accumulation with elevated SUV in the prostate/prostate bed tracking towards the bladder base with distortion of the urinary bladder.  Signs of brachytherapy in the prostate as well.  No definitive signs of extraprostatic disease.  A single lymph node in the left the pelvis with only minimally elevated SUV.  Equivocal.  Aortic atherosclerosis. Image findings were reviewed and discussed with patient. Recommend patient to concerning adding second-generation androgen receptor blocker he agrees. We will look into coverage of darolutamide rationale and potential side effects were discussed.  He agrees to have [place] check his coverage. Continue androgen deprivation therapy.  Patient will proceed with Eligard today Follow-up plan to be determined. Depending on coverage/delivery date of darolutamide I will see him 2 weeks after he starts therapy.   Adrik Server, MD, PhD 10/07/2021

## 2021-10-07 NOTE — Progress Notes (Signed)
Pt in for follow up, denies any concern today.

## 2021-10-08 ENCOUNTER — Telehealth: Payer: Self-pay | Admitting: Pharmacy Technician

## 2021-10-08 ENCOUNTER — Other Ambulatory Visit (HOSPITAL_COMMUNITY): Payer: Self-pay

## 2021-10-08 ENCOUNTER — Encounter: Payer: Self-pay | Admitting: Oncology

## 2021-10-08 NOTE — Telephone Encounter (Signed)
Oral Oncology Patient Advocate Encounter  Prior Authorization for Sean Wood has been approved.    PA# [id number] Effective dates: 10/08/21 through 10/08/22  Patients co-pay is $3009.52  Oral Oncology Clinic will continue to follow.   [place] Patient [place] Phone [phone] Fax [phone] 10/08/2021 11:33 AM

## 2021-10-08 NOTE — Telephone Encounter (Signed)
Oral Oncology Patient Advocate Encounter   Received notification from [employer] that prior authorization for Nubeqa is required.   PA submitted on CoverMyMeds Key [id number] Status is pending   Oral Oncology Clinic will continue to follow.   [place] Patient [place] Phone [phone] Fax [phone] 10/08/2021 11:29 AM

## 2021-10-09 ENCOUNTER — Telehealth: Payer: Self-pay

## 2021-10-09 DIAGNOSIS — C61 Malignant neoplasm of prostate: Secondary | ICD-10-CM

## 2021-10-09 MED ORDER — DAROLUTAMIDE 300 MG PO TABS: 600.0000 mg | ORAL_TABLET | Freq: Two times a day (BID) | ORAL | 0 refills | Status: DC

## 2021-10-09 NOTE — Telephone Encounter (Addendum)
Oral Oncology Pharmacist Encounter  Received new prescription for Nubeqa (darolutamide) for the treatment of nonmetastatic, castration resistant prostate cancer in conjunction with Eligard (leuprolide), planned duration until disease progression or unacceptable drug toxicity.  Labs from 10/06/21 assessed, no relevant lab abnormalities. Prescription dose and frequency assessed.   Current medication list in Epic reviewed, no DDIs with darolutamide identified.  Evaluated chart and no patient barriers to medication adherence identified.   Prescription has been e-scribed to the [facility] for benefits analysis and approval.  Oral Oncology Clinic will continue to follow for insurance authorization, copayment issues, initial counseling and start date.  Patient agreed to treatment on 10/07/21 per MD documentation.  Benn Moulder, PharmD Pharmacy Resident  10/09/2021 11:42 AM

## 2021-10-13 ENCOUNTER — Telehealth: Payer: Self-pay | Admitting: Pharmacy Technician

## 2021-10-13 NOTE — Telephone Encounter (Signed)
Oral Oncology Patient Advocate Encounter  Met patient in lobby to complete application for [employer] [place] Patient Assistance in an effort to reduce patient's out of pocket expense for Nubeqa to $0.    Application completed and faxed to [phone].   [employer] [place] patient assistance phone number for follow up is [phone].   This encounter will be updated until final determination.   [place] Patient [place] Phone [phone] Fax [phone] 10/13/2021 11:01 AM

## 2021-10-13 NOTE — Telephone Encounter (Signed)
Oral Oncology Patient Advocate Encounter  Met patient in lobby to complete application for [employer] to request a free 30 day trial of Nubeqa from the manufacturer.  Application completed and faxed to [phone].   [employer] [place] patient assistance phone number for follow up is [phone].   Patient was instructed to call me when he receives the free trial so we can document his start date and have the pharmacist educate him on the [place].  [place] Patient [place] Phone [phone] Fax [phone] 10/13/2021 10:56 AM

## 2021-10-19 ENCOUNTER — Other Ambulatory Visit: Payer: Self-pay

## 2021-10-19 DIAGNOSIS — C61 Malignant neoplasm of prostate: Secondary | ICD-10-CM

## 2021-10-19 NOTE — Progress Notes (Unsigned)
[place]  [phone] [phone]  Patient Care Team: Chrismon, Vickki Muff, [name] (Inactive) as PCP - General (Physician Assistant) Garrel Ridgel, DPM as Consulting Physician (Podiatry) Noreene Filbert, MD as Referring Physician (Radiation Oncology) Idelle Leech, OD as Consulting Physician (Optometry) Bryer Server, MD as Consulting Physician (Oncology)   Name of the patient: Sean Wood  [id number]  15-Nov-1936   Date of visit: 10/20/21  HPI: Patient is a 85 y.o. male with nonmetastatic, castration resistant prostate cancer. Currently receiving ADT therapy, Nubeqa (darolutamide) will be added to his current treatment regimen.   Reason for Consult: Nubeqa (darolutamide) oral chemotherapy education.   PAST MEDICAL HISTORY: Past Medical History:  Diagnosis Date   Arthritis    COPD (chronic obstructive pulmonary disease) (HCC)    Elevated PSA    Hematuria, gross    History of hiatal hernia    HOH (hard of hearing)    Kidney stone    Prostate cancer ([place])    Shortness of breath dyspnea     HEMATOLOGY/ONCOLOGY HISTORY:  Oncology History  CA of prostate ([place])  06/01/2013 Initial Diagnosis   CA of prostate ([place])    Chemotherapy      Radiation Therapy       ALLERGIES:  is allergic to mevacor [lovastatin], niacin and related, statins, and aleve [naproxen sodium].  MEDICATIONS:  Current Outpatient Medications  Medication Sig Dispense Refill   darolutamide (NUBEQA) 300 MG tablet Take 2 tablets (600 mg total) by mouth 2 (two) times daily with a meal. 120 tablet 0   No current facility-administered medications for this visit.    VITAL SIGNS: There were no vitals taken for this visit. There were no vitals filed for this visit.  Estimated body mass index is 21.41 kg/m as calculated from the following:   Height as of 03/10/21: 5\' 9"  (1.753 m).   Weight as of 10/07/21: 65.8 kg (145 lb).  LABS: CBC:     Component Value Date/Time   WBC 3.5 (L) 10/06/2021 1037   HGB 13.3 10/06/2021 1037   HGB 14.1 03/05/2019 0913   HCT 41.9 10/06/2021 1037   HCT 41.6 03/05/2019 0913   PLT 162 10/06/2021 1037   PLT 293 03/05/2019 0913   MCV 88.6 10/06/2021 1037   MCV 85 03/05/2019 0913   NEUTROABS 1.4 (L) 10/06/2021 1037   NEUTROABS 2.6 03/05/2019 0913   LYMPHSABS 1.6 10/06/2021 1037   LYMPHSABS 1.8 03/05/2019 0913   MONOABS 0.5 10/06/2021 1037   EOSABS 0.0 10/06/2021 1037   EOSABS 0.1 03/05/2019 0913   BASOSABS 0.0 10/06/2021 1037   BASOSABS 0.0 03/05/2019 0913   Comprehensive Metabolic Panel:    Component Value Date/Time   NA 140 10/06/2021 1037   NA 144 03/05/2019 0913   K 4.5 10/06/2021 1037   CL 103 10/06/2021 1037   CO2 32 10/06/2021 1037   BUN 19 10/06/2021 1037   BUN 16 03/05/2019 0913   CREATININE 0.98 10/06/2021 1037   GLUCOSE 119 (H) 10/06/2021 1037   CALCIUM 9.1 10/06/2021 1037   AST 18 10/06/2021 1037   ALT 9 10/06/2021 1037   ALKPHOS 64 10/06/2021 1037   BILITOT 0.4 10/06/2021 1037   BILITOT <0.2 03/05/2019 0913   PROT 7.1 10/06/2021 1037   PROT 6.2 03/05/2019 0913   ALBUMIN 4.2 10/06/2021 1037   ALBUMIN 4.3 03/05/2019 0913     Present during today's visit: ***  Start plan: Patient will get started on treatment tomorrow  10/21/21   Patient Education I spoke with patient for overview of new oral chemotherapy medication: darolutamide   Administration: Counseled patient on administration, dosing, side effects, monitoring, drug-food interactions, safe handling, storage, and disposal. Patient will take 2 tablets (600 mg total) by mouth 2 (two) times daily with a meal.  Side Effects: Side effects include but not limited to: changes in lver function, decreased in wbc, and fatigue.    Drug-drug Interactions (DDI): Currently no DDIs  Adherence: After discussion with patient *** patient barriers to medication adherence identified.  Reviewed with patient importance of  keeping a medication schedule and plan for any missed doses.  *** voiced understanding and appreciation. All questions answered. Medication handout provided.  Provided patient with [facility] phone number. Patient knows to call the office with questions or concerns. Oral Chemotherapy Navigation Clinic will continue to follow.  Patient expressed understanding and was in agreement with this plan. He also understands that He can call clinic at any time with any questions, concerns, or complaints.   Medication Access Issues: Patient received one month quick start from the manufacturer, still awaiting full manufacturer assistance approval  Follow-up plan: RTC in 2 weeks  Thank you for allowing me to participate in the care of this patient.   Time Total: ***  Visit consisted of counseling and education on dealing with issues of symptom management in the setting of serious and potentially life-threatening illness.Greater than 50%  of this time was spent counseling and coordinating care related to the above assessment and plan.  Signed by: Darl Pikes, PharmD, BCPS, Salley Slaughter, CPP Hematology/Oncology Clinical Pharmacist Practitioner [facility]/AP [facility] [phone]  10/20/2021 10:19 AM

## 2021-10-19 NOTE — Telephone Encounter (Signed)
Called patient to check status of free trial shipment of Nubeqa.  Patient states that he received it on Friday 10/28 and had not started it yet.  He will come in on 10/20/21 to meet with Alyson for education and initiate start of medication.  [place] Patient [place] Phone [phone] Fax [phone] 10/19/2021 10:03 AM

## 2021-10-20 ENCOUNTER — Inpatient Hospital Stay: Payer: PPO | Attending: Oncology | Admitting: Pharmacist

## 2021-10-20 ENCOUNTER — Telehealth: Payer: Self-pay | Admitting: Pharmacist

## 2021-10-20 DIAGNOSIS — J439 Emphysema, unspecified: Secondary | ICD-10-CM | POA: Insufficient documentation

## 2021-10-20 DIAGNOSIS — Z87891 Personal history of nicotine dependence: Secondary | ICD-10-CM | POA: Insufficient documentation

## 2021-10-20 DIAGNOSIS — Z8042 Family history of malignant neoplasm of prostate: Secondary | ICD-10-CM | POA: Insufficient documentation

## 2021-10-20 DIAGNOSIS — C61 Malignant neoplasm of prostate: Secondary | ICD-10-CM | POA: Insufficient documentation

## 2021-10-20 DIAGNOSIS — D709 Neutropenia, unspecified: Secondary | ICD-10-CM | POA: Insufficient documentation

## 2021-10-20 NOTE — Telephone Encounter (Signed)
[place]  [phone] [phone]  Patient Care Team: Chrismon, Vickki Muff, [name] (Inactive) as PCP - General (Physician Assistant) Garrel Ridgel, DPM as Consulting Physician (Podiatry) Noreene Filbert, MD as Referring Physician (Radiation Oncology) Rayetta Humphrey as Consulting Physician (Optometry) Aeron Server, MD as Consulting Physician (Oncology)   Name of the patient: Sean Wood  [id number]  08-Nov-1936   Date of visit: 10/20/21, patient was unable to find someone to care for his wife today so he was unable to make it to his scheduled appt. Completed education over the phone.    HPI: Patient is a 85 y.o. male with nonmetastatic, castration resistant prostate cancer. Currently receiving ADT therapy, Nubeqa (darolutamide) will be added to his current treatment regimen.   Reason for Consult: Nubeqa (darolutamide) oral chemotherapy education.   PAST MEDICAL HISTORY: Past Medical History:  Diagnosis Date   Arthritis    COPD (chronic obstructive pulmonary disease) (HCC)    Elevated PSA    Hematuria, gross    History of hiatal hernia    HOH (hard of hearing)    Kidney stone    Prostate cancer ([place])    Shortness of breath dyspnea     HEMATOLOGY/ONCOLOGY HISTORY:  Oncology History  CA of prostate ([place])  06/01/2013 Initial Diagnosis   CA of prostate ([place])    Chemotherapy      Radiation Therapy       ALLERGIES:  is allergic to mevacor [lovastatin], niacin and related, statins, and aleve [naproxen sodium].  MEDICATIONS:  Current Outpatient Medications  Medication Sig Dispense Refill   darolutamide (NUBEQA) 300 MG tablet Take 2 tablets (600 mg total) by mouth 2 (two) times daily with a meal. 120 tablet 0   No current facility-administered medications for this visit.    VITAL SIGNS: There were no vitals taken for this visit. There were no vitals filed for this visit.  Estimated body mass index is  21.41 kg/m as calculated from the following:   Height as of 03/10/21: 5\' 9"  (1.753 m).   Weight as of 10/07/21: 65.8 kg (145 lb).  LABS: CBC:    Component Value Date/Time   WBC 3.5 (L) 10/06/2021 1037   HGB 13.3 10/06/2021 1037   HGB 14.1 03/05/2019 0913   HCT 41.9 10/06/2021 1037   HCT 41.6 03/05/2019 0913   PLT 162 10/06/2021 1037   PLT 293 03/05/2019 0913   MCV 88.6 10/06/2021 1037   MCV 85 03/05/2019 0913   NEUTROABS 1.4 (L) 10/06/2021 1037   NEUTROABS 2.6 03/05/2019 0913   LYMPHSABS 1.6 10/06/2021 1037   LYMPHSABS 1.8 03/05/2019 0913   MONOABS 0.5 10/06/2021 1037   EOSABS 0.0 10/06/2021 1037   EOSABS 0.1 03/05/2019 0913   BASOSABS 0.0 10/06/2021 1037   BASOSABS 0.0 03/05/2019 0913   Comprehensive Metabolic Panel:    Component Value Date/Time   NA 140 10/06/2021 1037   NA 144 03/05/2019 0913   K 4.5 10/06/2021 1037   CL 103 10/06/2021 1037   CO2 32 10/06/2021 1037   BUN 19 10/06/2021 1037   BUN 16 03/05/2019 0913   CREATININE 0.98 10/06/2021 1037   GLUCOSE 119 (H) 10/06/2021 1037   CALCIUM 9.1 10/06/2021 1037   AST 18 10/06/2021 1037   ALT 9 10/06/2021 1037   ALKPHOS 64 10/06/2021 1037   BILITOT 0.4 10/06/2021 1037   BILITOT <0.2 03/05/2019 0913   PROT 7.1 10/06/2021 1037   PROT 6.2 03/05/2019 0913  ALBUMIN 4.2 10/06/2021 1037   ALBUMIN 4.3 03/05/2019 0913     Start plan: Patient will get started on treatment tomorrow 10/21/21   Patient Education I spoke with patient for overview of new oral chemotherapy medication: darolutamide   Administration: Counseled patient on administration, dosing, side effects, monitoring, drug-food interactions, safe handling, storage, and disposal. Patient will take 2 tablets (600 mg total) by mouth 2 (two) times daily with a meal.  Side Effects: Side effects include but not limited to: changes in liver function, decreased in wbc, and fatigue.    Drug-drug Interactions (DDI): Currently no DDIs  Adherence: After  discussion with patient one patient barriers to medication adherence identified. Patient reports not currently needing to take any mediations, so he is not used to having to take medication. It may be hard for him to remember to take a twice a day medication. Suggested that he place his medication where he normally sits to eat so he can remember to take his medication  Reviewed with patient importance of keeping a medication schedule and plan for any missed doses.  [name] voiced understanding and appreciation. All questions answered. Medication handout placed in the mail.  Provided patient with Oral [facility] phone number. Patient knows to call the office with questions or concerns. Oral Chemotherapy Navigation Clinic will continue to follow.  Patient expressed understanding and was in agreement with this plan. He also understands that He can call clinic at any time with any questions, concerns, or complaints.   Medication Access Issues: Patient received one month quick start from the manufacturer, still awaiting full manufacturer assistance approval  Follow-up plan: RTC in 2 weeks  Thank you for allowing me to participate in the care of this patient.   Time Total: 15 mins  Visit consisted of counseling and education on dealing with issues of symptom management in the setting of serious and potentially life-threatening illness.Greater than 50%  of this time was spent counseling and coordinating care related to the above assessment and plan.  Signed by: Darl Pikes, PharmD, BCPS, Salley Slaughter, CPP Hematology/Oncology Clinical Pharmacist Practitioner [facility] [phone]  10/20/2021 11:06 AM

## 2021-10-26 NOTE — Telephone Encounter (Signed)
Oral Oncology Patient Advocate Encounter  Received notification from [employer] [place] [employer] that patient has been successfully enrolled into their program to receive Nubeqa from the manufacturer at $0 out of pocket until 12/19/21.    I called and spoke with patient's wife.  She knows he will have to re-apply.   Specialty Pharmacy that will dispense medication is [facility].  Patient knows to call the office with questions or concerns.   Oral Oncology Clinic will continue to follow.  [place] Patient [place] Phone [phone] Fax [phone] 10/26/2021 2:33 PM

## 2021-11-05 ENCOUNTER — Inpatient Hospital Stay: Payer: PPO

## 2021-11-05 ENCOUNTER — Other Ambulatory Visit: Payer: Self-pay

## 2021-11-05 ENCOUNTER — Inpatient Hospital Stay (HOSPITAL_BASED_OUTPATIENT_CLINIC_OR_DEPARTMENT_OTHER): Payer: PPO | Admitting: Oncology

## 2021-11-05 ENCOUNTER — Inpatient Hospital Stay: Payer: PPO | Admitting: Pharmacist

## 2021-11-05 ENCOUNTER — Encounter: Payer: Self-pay | Admitting: Oncology

## 2021-11-05 VITALS — BP 122/66 | HR 65 | Temp 96.8°F | Wt 143.0 lb

## 2021-11-05 DIAGNOSIS — C61 Malignant neoplasm of prostate: Secondary | ICD-10-CM

## 2021-11-05 DIAGNOSIS — D709 Neutropenia, unspecified: Secondary | ICD-10-CM | POA: Diagnosis not present

## 2021-11-05 DIAGNOSIS — J439 Emphysema, unspecified: Secondary | ICD-10-CM | POA: Diagnosis not present

## 2021-11-05 DIAGNOSIS — Z87891 Personal history of nicotine dependence: Secondary | ICD-10-CM | POA: Diagnosis not present

## 2021-11-05 DIAGNOSIS — Z8042 Family history of malignant neoplasm of prostate: Secondary | ICD-10-CM | POA: Diagnosis not present

## 2021-11-05 DIAGNOSIS — Z79818 Long term (current) use of other agents affecting estrogen receptors and estrogen levels: Secondary | ICD-10-CM | POA: Diagnosis not present

## 2021-11-05 LAB — CBC WITH DIFFERENTIAL/PLATELET
Abs Immature Granulocytes: 0.02 10*3/uL (ref 0.00–0.07)
Basophils Absolute: 0 10*3/uL (ref 0.0–0.1)
Basophils Relative: 0 %
Eosinophils Absolute: 0 10*3/uL (ref 0.0–0.5)
Eosinophils Relative: 1 %
HCT: 44.2 % (ref 39.0–52.0)
Hemoglobin: 14.4 g/dL (ref 13.0–17.0)
Immature Granulocytes: 1 %
Lymphocytes Relative: 51 %
Lymphs Abs: 1.8 10*3/uL (ref 0.7–4.0)
MCH: 28.3 pg (ref 26.0–34.0)
MCHC: 32.6 g/dL (ref 30.0–36.0)
MCV: 87 fL (ref 80.0–100.0)
Monocytes Absolute: 0.6 10*3/uL (ref 0.1–1.0)
Monocytes Relative: 18 %
Neutro Abs: 1 10*3/uL — ABNORMAL LOW (ref 1.7–7.7)
Neutrophils Relative %: 29 %
Platelets: 206 10*3/uL (ref 150–400)
RBC: 5.08 MIL/uL (ref 4.22–5.81)
RDW: 13.4 % (ref 11.5–15.5)
WBC: 3.4 10*3/uL — ABNORMAL LOW (ref 4.0–10.5)
nRBC: 0 % (ref 0.0–0.2)

## 2021-11-05 LAB — COMPREHENSIVE METABOLIC PANEL
ALT: 8 U/L (ref 0–44)
AST: 18 U/L (ref 15–41)
Albumin: 4.2 g/dL (ref 3.5–5.0)
Alkaline Phosphatase: 74 U/L (ref 38–126)
Anion gap: 9 (ref 5–15)
BUN: 19 mg/dL (ref 8–23)
CO2: 32 mmol/L (ref 22–32)
Calcium: 9.4 mg/dL (ref 8.9–10.3)
Chloride: 98 mmol/L (ref 98–111)
Creatinine, Ser: 0.94 mg/dL (ref 0.61–1.24)
GFR, Estimated: >60 mL/min (ref >60)
Glucose, Bld: 114 mg/dL — ABNORMAL HIGH (ref 70–99)
Potassium: 4.1 mmol/L (ref 3.5–5.1)
Sodium: 139 mmol/L (ref 135–145)
Total Bilirubin: 0.4 mg/dL (ref 0.3–1.2)
Total Protein: 7.2 g/dL (ref 6.5–8.1)

## 2021-11-05 MED ORDER — DAROLUTAMIDE 300 MG PO TABS: 600.0000 mg | ORAL_TABLET | Freq: Two times a day (BID) | ORAL | 1 refills | Status: DC

## 2021-11-05 NOTE — Progress Notes (Signed)
[place]  Telephone:([phone] [phone]) [phone]  Patient Care Team: Chrismon, Vickki Muff, [name] (Inactive) as PCP - General (Physician Assistant) Garrel Ridgel, DPM as Consulting Physician (Podiatry) Noreene Filbert, MD as Referring Physician (Radiation Oncology) Idelle Leech, OD as Consulting Physician (Optometry) Rajinder Server, MD as Consulting Physician (Oncology)   Name of the patient: Sean Wood  [id number]  1936-08-13   Date of visit: 11/05/21  HPI: Patient is a 85 y.o. male with nonmetastatic, castration resistant prostate cancer. Currently receiving ADT therapy and Nubeqa (darolutamide). Patient started darolutamide on 10/21/21.  Reason for Consult: Oral chemotherapy follow-up for darolutamide therapy.   PAST MEDICAL HISTORY: Past Medical History:  Diagnosis Date   Arthritis    COPD (chronic obstructive pulmonary disease) (HCC)    Elevated PSA    Hematuria, gross    History of hiatal hernia    HOH (hard of hearing)    Kidney stone    Prostate cancer ([place])    Shortness of breath dyspnea     HEMATOLOGY/ONCOLOGY HISTORY:  Oncology History  CA of prostate ([place])  06/01/2013 Initial Diagnosis   CA of prostate ([place])    Chemotherapy      Radiation Therapy       ALLERGIES:  is allergic to mevacor [lovastatin], niacin and related, statins, and aleve [naproxen sodium].  MEDICATIONS:  Current Outpatient Medications  Medication Sig Dispense Refill   darolutamide (NUBEQA) 300 MG tablet Take 2 tablets (600 mg total) by mouth 2 (two) times daily with a meal. 120 tablet 0   No current facility-administered medications for this visit.    VITAL SIGNS: There were no vitals taken for this visit. There were no vitals filed for this visit.  Estimated body mass index is 21.12 kg/m as calculated from the following:   Height as of 03/10/21: 5\' 9"  (1.753 m).   Weight as of an earlier encounter on 11/05/21: 64.9 kg (143  lb).  LABS: CBC:    Component Value Date/Time   WBC 3.4 (L) 11/05/2021 0857   HGB 14.4 11/05/2021 0857   HGB 14.1 03/05/2019 0913   HCT 44.2 11/05/2021 0857   HCT 41.6 03/05/2019 0913   PLT 206 11/05/2021 0857   PLT 293 03/05/2019 0913   MCV 87.0 11/05/2021 0857   MCV 85 03/05/2019 0913   NEUTROABS 1.0 (L) 11/05/2021 0857   NEUTROABS 2.6 03/05/2019 0913   LYMPHSABS 1.8 11/05/2021 0857   LYMPHSABS 1.8 03/05/2019 0913   MONOABS 0.6 11/05/2021 0857   EOSABS 0.0 11/05/2021 0857   EOSABS 0.1 03/05/2019 0913   BASOSABS 0.0 11/05/2021 0857   BASOSABS 0.0 03/05/2019 0913   Comprehensive Metabolic Panel:    Component Value Date/Time   NA 140 10/06/2021 1037   NA 144 03/05/2019 0913   K 4.5 10/06/2021 1037   CL 103 10/06/2021 1037   CO2 32 10/06/2021 1037   BUN 19 10/06/2021 1037   BUN 16 03/05/2019 0913   CREATININE 0.98 10/06/2021 1037   GLUCOSE 119 (H) 10/06/2021 1037   CALCIUM 9.1 10/06/2021 1037   AST 18 10/06/2021 1037   ALT 9 10/06/2021 1037   ALKPHOS 64 10/06/2021 1037   BILITOT 0.4 10/06/2021 1037   BILITOT <0.2 03/05/2019 0913   PROT 7.1 10/06/2021 1037   PROT 6.2 03/05/2019 0913   ALBUMIN 4.2 10/06/2021 1037   ALBUMIN 4.3 03/05/2019 0913     Present during today's visit: patient only  Assessment and Plan: Continue darolutamide 600mg   twice daily   Oral Chemotherapy Side Effect/Intolerance:  No reported side effects reports. Patient did not a notice a change in his energy level.  Oral Chemotherapy Adherence: no missed doses reported. Patient keeps his medication where he sits down to eat so he remembers to take it! No patient barriers to medication adherence identified.   New medications: none reported  Medication Access Issues: No issues, provided patient with the phone number to [place] Patient [employer] to call for a refill when it is time.  Patient expressed understanding and was in agreement with this plan. He also understands that He can  call clinic at any time with any questions, concerns, or complaints.   Follow-up plan: RTC in 2 weeks  Thank you for allowing me to participate in the care of this very pleasant patient.   Time Total: 15 mins  Visit consisted of counseling and education on dealing with issues of symptom management in the setting of serious and potentially life-threatening illness.Greater than 50%  of this time was spent counseling and coordinating care related to the above assessment and plan.  Signed by: Darl Pikes, PharmD, BCPS, Salley Slaughter, CPP Hematology/Oncology Clinical Pharmacist Practitioner [facility] [place] [facility] [phone]  11/05/2021 9:18 AM

## 2021-11-05 NOTE — Progress Notes (Addendum)
Hematology/Oncology Follow up note [phone] [phone]   Patient Care Team: Chrismon, Vickki Muff, [name] (Inactive) as PCP - General (Physician Assistant) Garrel Ridgel, DPM as Consulting Physician (Podiatry) Noreene Filbert, MD as Referring Physician (Radiation Oncology) Idelle Leech, OD as Consulting Physician (Optometry) Xavious Server, MD as Consulting Physician (Oncology) REASON FOR VISIT Follow up for treatment of biochemical recurrence of prostate cancer.  HISTORY OF PRESENTING ILLNESS:  Sean Wood. is a  85 y.o.  male with PMH listed below who was referred to me for evaluation of prostate cancer.   Cancer history dated back to 2009 when He was diagnosed with intermediate risk, Gleason 3+4, T1c prostate cancer in 2009. PSA at diagnosis was 6.1. IMRT was completed in August 2009. PSA nadir was 0.5. His PSA January 2013 was 0.8 and in January 2014 was 1.3. This was repeated in May 2014 and had increased to 1.8. He elected a repeat prostate biopsy in November 2014 after his PSA in October had increased to 2.2. This showed a focus of Gleason 3+3 adenocarcinoma from the right prostate involving less than 5%. He elected surveillance after discussing options. In May 2016, he was evaluated by Dr.Chrystal and he received salvage seed implantation with I-125. He subsequently follows with Dr.Chrystal for Lupron every 4 months since then. Achieve PSA nadir of 0.06 in 09/2016, and started to trend up and most recently trended up to 9.64.   # Lupron 30 mg on 05/10/2018, 06/14/2018 testosterone level was at 6  # Leupron 22.5mg  11/06/2018.  Delayed as patient reports feeling very fatigued and we discussed about holding Lupron.  44-month and restarted on 11/06/2018.  10/06/2021, PSMA PET scan showed marked radiotracer accumulation with elevated SUV in the prostate/prostate bed tracking towards the bladder base with distortion of the urinary bladder.  Signs of brachytherapy in the  prostate as well.  No definitive signs of extraprostatic disease.  A single lymph node in the left the pelvis with only minimally elevated SUV.  Equivocal.  Aortic atherosclerosis.  INTERVAL HISTORY Sean Wood. is a 85 y.o. 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  He declines inhaler. He has been on Darolutamide 600 mg twice daily.  He tolerates well.  Denies any new complaints. 11/ 12/2020 Darolutamide    Review of Systems  Constitutional:  Positive for malaise/fatigue. Negative for chills, fever and weight loss.  HENT:  Negative for sore throat.   Eyes:  Negative for redness.  Respiratory:  Positive for shortness of breath. Negative for cough and wheezing.   Cardiovascular:  Negative for chest pain, palpitations and leg swelling.  Gastrointestinal:  Negative for abdominal pain, blood in stool, nausea and vomiting.  Genitourinary:  Negative for dysuria.  Musculoskeletal:  Negative for myalgias.  Skin:  Negative for rash.  Neurological:  Negative for dizziness, tingling and tremors.  Endo/Heme/Allergies:  Does not bruise/bleed easily.  Psychiatric/Behavioral:  Negative for hallucinations.    MEDICAL HISTORY:  Past Medical History:  Diagnosis Date   Arthritis    COPD (chronic obstructive pulmonary disease) (HCC)    Elevated PSA    Hematuria, gross    History of hiatal hernia    HOH (hard of hearing)    Kidney stone    Prostate cancer ([place])    Shortness  of breath dyspnea     SURGICAL HISTORY: Past Surgical History:  Procedure Laterality Date   [place]  06/24/2015   Procedure: CYSTOSCOPY;  Surgeon: Hollice Espy, MD;  Location: [facility] OR[facility];  Service: Urology;;   HERNIA REPAIR     left and right inguinal hernia repair    RADIOACTIVE SEED IMPLANT N/A 06/24/2015   Procedure: RADIOACTIVE SEED IMPLANT/BRACHYTHERAPY IMPLANT;  Surgeon: Hollice Espy, MD;  Location: [facility] OR[facility];  Service: Urology;  Laterality: N/A;    SOCIAL HISTORY: Social History   Socioeconomic History   Marital status: Married    Spouse name: Not on file   Number of children: 1   Years of education: Not on file   Highest education level: 8th grade  Occupational History   Occupation: retired  Tobacco Use   Smoking status: Former    Packs/day: 1.50    Types: Cigarettes    Quit date: 05/24/1981    Years since quitting: 40.4   Smokeless tobacco: Current    Types: Chew  Vaping Use   Vaping Use: Never used  Substance and Sexual Activity   Alcohol use: Not Currently    Alcohol/week: 0.0 standard drinks   Drug use: No   Sexual activity: Not on file  Other Topics Concern   Not on file  Social History Narrative   Not on file   Social Determinants of Health   Financial Resource Strain: Not on file  Food Insecurity: Not on file  Transportation Needs: Not on file  Physical Activity: Not on file  Stress: Not on file  Social Connections: Not on file  Intimate Partner Violence: Not on file    FAMILY HISTORY: Family History  Problem Relation Age of Onset   Cancer Brother    Heart disease Brother    Diabetes Brother    Emphysema Brother    Prostate cancer Brother    Diabetes Sister    Emphysema Mother    Healthy Sister    Healthy Sister    Healthy Sister    Diabetes Brother    Diabetes Brother    Diabetes Brother    Healthy Brother     ALLERGIES:  is allergic to [employer] [lovastatin], niacin and related, statins, and aleve [naproxen sodium].  MEDICATIONS:  Current Outpatient Medications  Medication Sig Dispense Refill   darolutamide (NUBEQA) 300 MG tablet Take 2 tablets (600 mg total) by mouth 2 (two) times daily with a meal. 120 tablet 1   No current facility-administered medications for this visit.     PHYSICAL  EXAMINATION: ECOG PERFORMANCE STATUS: 1 - Symptomatic but completely ambulatory Vitals:   11/05/21 0907  BP: 122/66  Pulse: 65  Temp: (!) 96.8 F (36 C)   Filed Weights   11/05/21 0907  Weight: 143 lb (64.9 kg)     Physical Exam Constitutional:      General: He is not in acute distress.    Comments: Patient walks independently.  HENT:     Head: Normocephalic and atraumatic.  Eyes:     General: No scleral icterus.    Conjunctiva/sclera: Conjunctivae normal.     Pupils: Pupils are equal, round, and reactive to light.  Cardiovascular:     Rate and Rhythm: Normal rate and regular rhythm.     Heart sounds: Normal heart sounds.  Pulmonary:     Effort: Pulmonary effort is normal. No respiratory distress.     Breath sounds: No wheezing or rales.  Comments: Decreased breath sound bilaterally Chest:     Chest wall: No tenderness.  Abdominal:     General: Bowel sounds are normal. There is no distension.     Palpations: Abdomen is soft. There is no mass.     Tenderness: There is no abdominal tenderness.  Musculoskeletal:        General: No deformity. Normal range of motion.     Cervical back: Normal range of motion and neck supple.  Lymphadenopathy:     Cervical: No cervical adenopathy.  Skin:    General: Skin is warm and dry.     Findings: No erythema or rash.  Neurological:     Mental Status: He is alert and oriented to person, place, and time. Mental status is at baseline.     Cranial Nerves: No cranial nerve deficit.     Coordination: Coordination normal.  Psychiatric:        Mood and Affect: Mood normal.     LABORATORY DATA:  I have reviewed the data as listed Lab Results  Component Value Date   WBC 3.4 (L) 11/05/2021   HGB 14.4 11/05/2021   HCT 44.2 11/05/2021   MCV 87.0 11/05/2021   PLT 206 11/05/2021   Recent Labs    06/08/21 1243 10/06/21 1037 11/05/21 0857  NA 139 140 139  K 4.0 4.5 4.1  CL 99 103 98  CO2 32 32 32  GLUCOSE 103* 119* 114*   BUN 21 19 19   CREATININE 0.81 0.98 0.94  CALCIUM 9.1 9.1 9.4  GFRNONAA >60 >60 >60  PROT 7.1 7.1 7.2  ALBUMIN 4.1 4.2 4.2  AST 18 18 18   ALT 9 9 8   ALKPHOS 59 64 74  BILITOT 0.7 0.4 0.4    RADIOGRAPHIC STUDIES: I have personally reviewed the radiological images as listed and agreed with the findings in the report. NM PET (PSMA) SKULL TO MID THIGH  Result Date: 10/07/2021 CLINICAL DATA:  History of prostate neoplasm diagnosed in 2009. Initially treated with IMRT, subsequent salvage therapy in 2016 with seed implantation. Lowest PSA 0.06 with most recent elevated PSA of 9.64. EXAM: NUCLEAR MEDICINE PET SKULL BASE TO THIGH TECHNIQUE: 9.2 mCi F18 Piflufolastat (Pylarify) was injected intravenously. Full-ring PET imaging was performed from the skull base to thigh after the radiotracer. CT data was obtained and used for attenuation correction and anatomic localization. COMPARISON:  Chest abdomen pelvis CT from 2019. FINDINGS: NECK No radiotracer activity in neck lymph nodes. Incidental CT finding: None CHEST No radiotracer accumulation within mediastinal or hilar lymph nodes. No suspicious pulmonary nodules on the CT scan. Incidental CT finding: Calcified atheromatous plaque in the thoracic aorta. Three-vessel coronary artery disease. Normal heart size. No substantial pericardial effusion. Normal caliber of central pulmonary vessels. Limited assessment of cardiovascular structures given lack of intravenous contrast. Signs of pulmonary emphysema that is worse towards the lung apices. Airways are patent. Biapical scarring is noted. ABDOMEN/PELVIS Prostate: Marked radiotracer accumulation in the prostate gland/bed. Maximum SUV of 21.5 encompassing the entire region of the prostate area measuring approximately 2.1 x 2.3 cm (image 254/3). Radiotracer tracks towards the base of the urinary bladder which show some distortion. Lymph nodes: Tiny LEFT pelvic sidewall lymph node (image 106/6 and image 224/3 with a  maximum SUV of 2.37. This measures approximately 3-4 mm. Liver: No evidence of liver metastasis Incidental CT finding: No acute findings relative to liver, gallbladder, spleen, pancreas, adrenal glands or kidneys. Gastrointestinal tract is unremarkable aside from colonic diverticulosis. Aortic atherosclerosis  without aneurysm. No adenopathy by size criteria in the retroperitoneum or in the pelvis. Brachytherapy seeds in the prostate gland. SKELETON No radiotracer accumulation to suggest skeletal metastasis. IMPRESSION: Marked radiotracer accumulation with elevated SUV in the prostate/prostate bed tracking towards the bladder base with distortion of the urinary bladder. Signs of brachytherapy in the prostate as well. No definite signs of extraprostatic disease at this time. Single lymph node in the LEFT pelvis with only minimally elevated SUV is equivocal, attention on follow-up. Aortic Atherosclerosis (ICD10-I70.0). Electronically Signed   By: Zetta Bills M.D.   On: 10/07/2021 09:16     ASSESSMENT & PLAN:  1. Malignant neoplasm of prostate ([place])   2. Encounter for monitoring androgen deprivation therapy    # Prostate Cancer with biochemical recurrence, asymptomatic, castration resistant nonmetastatic.  PSA continues to trend up to 6-PET scan confirmed local recurrence. Labs reviewed and discussed with patient. Continue Darolutamide 600 mg twice daily. ADT, on Eligard 30mg  [id number]. Last dose was on 10/19./2022   #Neutropenia, likely secondary to darolutamide side effects.  ANC is above 1.  Continue current regimen.  Follow up Repeat CBC in 2 weeks. Lab MD 4 weeks.   Orders Placed This Encounter  Procedures   CBC with Differential/Platelet    Standing Status:   Future    Standing Expiration Date:   11/05/2022   Comprehensive metabolic panel    Standing Status:   Future    Standing Expiration Date:   11/05/2022   CBC with Differential/Platelet    Standing Status:   Future    Standing  Expiration Date:   11/05/2022   Comprehensive metabolic panel    Standing Status:   Future    Standing Expiration Date:   11/05/2022   PSA    Standing Status:   Future    Standing Expiration Date:   11/05/2022   Ambulatory Referral to Palliative Care    Referral Priority:   Routine    Referral Type:   Consultation    Referral Reason:   Advance Care Planning    Number of Visits Requested:   1    Jaylon Server, MD, PhD 11/05/2021

## 2021-11-06 ENCOUNTER — Telehealth: Payer: Self-pay | Admitting: Pharmacy Technician

## 2021-11-19 ENCOUNTER — Inpatient Hospital Stay: Payer: PPO | Admitting: Pharmacist

## 2021-11-19 ENCOUNTER — Inpatient Hospital Stay: Payer: PPO

## 2021-11-19 ENCOUNTER — Inpatient Hospital Stay: Payer: PPO | Admitting: Hospice and Palliative Medicine

## 2021-11-19 NOTE — Progress Notes (Deleted)
[place]  [phone] [phone]  Patient Care Team: Chrismon, Vickki Muff, [name] (Inactive) as PCP - General (Physician Assistant) Garrel Ridgel, DPM as Consulting Physician (Podiatry) Noreene Filbert, MD as Referring Physician (Radiation Oncology) Idelle Leech, OD as Consulting Physician (Optometry) Wynter Server, MD as Consulting Physician (Oncology)   Name of the patient: Sean Wood  [id number]  Oct 03, 1936   Date of visit: 11/19/21  HPI: Patient is a 85 y.o. male with nonmetastatic, castration resistant prostate cancer. Currently receiving ADT therapy and Nubeqa (darolutamide). Patient started darolutamide on 10/21/21.  Reason for Consult: Oral chemotherapy follow-up for darolutamide therapy.   PAST MEDICAL HISTORY: Past Medical History:  Diagnosis Date   Arthritis    COPD (chronic obstructive pulmonary disease) (HCC)    Elevated PSA    Hematuria, gross    History of hiatal hernia    HOH (hard of hearing)    Kidney stone    Prostate cancer ([place])    Shortness of breath dyspnea     HEMATOLOGY/ONCOLOGY HISTORY:  Oncology History  CA of prostate ([place])  06/01/2013 Initial Diagnosis   CA of prostate ([place])    Chemotherapy      Radiation Therapy       ALLERGIES:  is allergic to mevacor [lovastatin], niacin and related, statins, and aleve [naproxen sodium].  MEDICATIONS:  Current Outpatient Medications  Medication Sig Dispense Refill   darolutamide (NUBEQA) 300 MG tablet Take 2 tablets (600 mg total) by mouth 2 (two) times daily with a meal. 120 tablet 1   No current facility-administered medications for this visit.    VITAL SIGNS: There were no vitals taken for this visit. There were no vitals filed for this visit.  Estimated body mass index is 21.12 kg/m as calculated from the following:   Height as of 03/10/21: 5\' 9"  (1.753 m).   Weight as of 11/05/21: 64.9 kg (143 lb).  LABS: CBC:     Component Value Date/Time   WBC 3.4 (L) 11/05/2021 0857   HGB 14.4 11/05/2021 0857   HGB 14.1 03/05/2019 0913   HCT 44.2 11/05/2021 0857   HCT 41.6 03/05/2019 0913   PLT 206 11/05/2021 0857   PLT 293 03/05/2019 0913   MCV 87.0 11/05/2021 0857   MCV 85 03/05/2019 0913   NEUTROABS 1.0 (L) 11/05/2021 0857   NEUTROABS 2.6 03/05/2019 0913   LYMPHSABS 1.8 11/05/2021 0857   LYMPHSABS 1.8 03/05/2019 0913   MONOABS 0.6 11/05/2021 0857   EOSABS 0.0 11/05/2021 0857   EOSABS 0.1 03/05/2019 0913   BASOSABS 0.0 11/05/2021 0857   BASOSABS 0.0 03/05/2019 0913   Comprehensive Metabolic Panel:    Component Value Date/Time   NA 139 11/05/2021 0857   NA 144 03/05/2019 0913   K 4.1 11/05/2021 0857   CL 98 11/05/2021 0857   CO2 32 11/05/2021 0857   BUN 19 11/05/2021 0857   BUN 16 03/05/2019 0913   CREATININE 0.94 11/05/2021 0857   GLUCOSE 114 (H) 11/05/2021 0857   CALCIUM 9.4 11/05/2021 0857   AST 18 11/05/2021 0857   ALT 8 11/05/2021 0857   ALKPHOS 74 11/05/2021 0857   BILITOT 0.4 11/05/2021 0857   BILITOT <0.2 03/05/2019 0913   PROT 7.2 11/05/2021 0857   PROT 6.2 03/05/2019 0913   ALBUMIN 4.2 11/05/2021 0857   ALBUMIN 4.3 03/05/2019 0913     Present during today's visit: patient only  Assessment and Plan: Continue darolutamide 600mg  twice daily  Oral Chemotherapy Side Effect/Intolerance:  No reported side effects reports. Patient did not a notice a change in his energy level.  Oral Chemotherapy Adherence: no missed doses reported. Patient keeps his medication where he sits down to eat so he remembers to take it! No patient barriers to medication adherence identified.   New medications: none reported  Medication Access Issues: No issues, provided patient with the phone number to [place] [facility] to call for a refill when it is time.  Patient expressed understanding and was in agreement with this plan. He also understands that He can call clinic at any time  with any questions, concerns, or complaints.   Follow-up plan: RTC in 2 weeks  Thank you for allowing me to participate in the care of this very pleasant patient.   Time Total: 15 mins  Visit consisted of counseling and education on dealing with issues of symptom management in the setting of serious and potentially life-threatening illness.Greater than 50%  of this time was spent counseling and coordinating care related to the above assessment and plan.  Signed by: Darl Pikes, PharmD, BCPS, Salley Slaughter, CPP Hematology/Oncology Clinical Pharmacist Practitioner [facility]/DB/AP [facility] [phone]  11/19/2021 9:34 AM

## 2021-11-20 ENCOUNTER — Encounter: Payer: Self-pay | Admitting: Oncology

## 2021-11-20 NOTE — Telephone Encounter (Signed)
Oral Oncology Patient Advocate Encounter  Was successful in securing patient a $8000 grant from [employer] to provide copayment coverage for Nubeqa.  This will keep the out of pocket expense at $0.     [employer] ID: [id number]  I have spoken with the patient.   The billing information is as follows and has been shared with [place].    RxBin: [id number] PCN: [id number] Member ID: [id number] Group ID: [id number] Dates of Eligibility: 10/07/21 through 10/06/22  Fund:  [place] Patient [place] Phone [phone] Fax [phone] 11/20/2021 4:00 PM

## 2021-11-23 ENCOUNTER — Inpatient Hospital Stay: Payer: PPO | Attending: Oncology

## 2021-11-23 ENCOUNTER — Inpatient Hospital Stay (HOSPITAL_BASED_OUTPATIENT_CLINIC_OR_DEPARTMENT_OTHER): Payer: PPO | Admitting: Hospice and Palliative Medicine

## 2021-11-23 ENCOUNTER — Other Ambulatory Visit: Payer: Self-pay

## 2021-11-23 ENCOUNTER — Inpatient Hospital Stay: Payer: PPO | Admitting: Pharmacist

## 2021-11-23 VITALS — BP 137/62 | HR 72 | Temp 97.1°F | Resp 18 | Wt 141.0 lb

## 2021-11-23 DIAGNOSIS — Z515 Encounter for palliative care: Secondary | ICD-10-CM

## 2021-11-23 DIAGNOSIS — R5383 Other fatigue: Secondary | ICD-10-CM | POA: Insufficient documentation

## 2021-11-23 DIAGNOSIS — Z87891 Personal history of nicotine dependence: Secondary | ICD-10-CM | POA: Insufficient documentation

## 2021-11-23 DIAGNOSIS — Z7189 Other specified counseling: Secondary | ICD-10-CM | POA: Diagnosis not present

## 2021-11-23 DIAGNOSIS — C61 Malignant neoplasm of prostate: Secondary | ICD-10-CM

## 2021-11-23 DIAGNOSIS — J449 Chronic obstructive pulmonary disease, unspecified: Secondary | ICD-10-CM | POA: Insufficient documentation

## 2021-11-23 DIAGNOSIS — Z8042 Family history of malignant neoplasm of prostate: Secondary | ICD-10-CM | POA: Insufficient documentation

## 2021-11-23 DIAGNOSIS — I700 Atherosclerosis of aorta: Secondary | ICD-10-CM | POA: Insufficient documentation

## 2021-11-23 LAB — CBC WITH DIFFERENTIAL/PLATELET
Abs Immature Granulocytes: 0.02 10*3/uL (ref 0.00–0.07)
Basophils Absolute: 0 10*3/uL (ref 0.0–0.1)
Basophils Relative: 0 %
Eosinophils Absolute: 0 10*3/uL (ref 0.0–0.5)
Eosinophils Relative: 1 %
HCT: 40.6 % (ref 39.0–52.0)
Hemoglobin: 12.9 g/dL — ABNORMAL LOW (ref 13.0–17.0)
Immature Granulocytes: 1 %
Lymphocytes Relative: 51 %
Lymphs Abs: 1.9 10*3/uL (ref 0.7–4.0)
MCH: 28 pg (ref 26.0–34.0)
MCHC: 31.8 g/dL (ref 30.0–36.0)
MCV: 88.3 fL (ref 80.0–100.0)
Monocytes Absolute: 0.5 10*3/uL (ref 0.1–1.0)
Monocytes Relative: 14 %
Neutro Abs: 1.2 10*3/uL — ABNORMAL LOW (ref 1.7–7.7)
Neutrophils Relative %: 33 %
Platelets: 171 10*3/uL (ref 150–400)
RBC: 4.6 MIL/uL (ref 4.22–5.81)
RDW: 13.5 % (ref 11.5–15.5)
WBC: 3.7 10*3/uL — ABNORMAL LOW (ref 4.0–10.5)
nRBC: 0 % (ref 0.0–0.2)

## 2021-11-23 LAB — COMPREHENSIVE METABOLIC PANEL
ALT: 8 U/L (ref 0–44)
AST: 16 U/L (ref 15–41)
Albumin: 3.9 g/dL (ref 3.5–5.0)
Alkaline Phosphatase: 71 U/L (ref 38–126)
Anion gap: 8 (ref 5–15)
BUN: 14 mg/dL (ref 8–23)
CO2: 30 mmol/L (ref 22–32)
Calcium: 8.6 mg/dL — ABNORMAL LOW (ref 8.9–10.3)
Chloride: 101 mmol/L (ref 98–111)
Creatinine, Ser: 0.9 mg/dL (ref 0.61–1.24)
GFR, Estimated: >60 mL/min (ref >60)
Glucose, Bld: 173 mg/dL — ABNORMAL HIGH (ref 70–99)
Potassium: 4.1 mmol/L (ref 3.5–5.1)
Sodium: 139 mmol/L (ref 135–145)
Total Bilirubin: 0.5 mg/dL (ref 0.3–1.2)
Total Protein: 6.9 g/dL (ref 6.5–8.1)

## 2021-11-23 NOTE — Progress Notes (Signed)
[place]  [phone] [phone]  Patient Care Team: Chrismon, Vickki Muff, [name] (Inactive) as PCP - General (Physician Assistant) Garrel Ridgel, DPM as Consulting Physician (Podiatry) Noreene Filbert, MD as Referring Physician (Radiation Oncology) Idelle Leech, OD as Consulting Physician (Optometry) Lyric Server, MD as Consulting Physician (Oncology)   Name of the patient: Sean Wood  [id number]  1936-03-20   Date of visit: 11/23/21  HPI: Patient is a 85 y.o. male with nonmetastatic, castration resistant prostate cancer. Currently receiving ADT therapy and Nubeqa (darolutamide). Patient started darolutamide on 10/21/21.  Reason for Consult: Oral chemotherapy follow-up for darolutamide therapy.   PAST MEDICAL HISTORY: Past Medical History:  Diagnosis Date   Arthritis    COPD (chronic obstructive pulmonary disease) (HCC)    Elevated PSA    Hematuria, gross    History of hiatal hernia    HOH (hard of hearing)    Kidney stone    Prostate cancer ([place])    Shortness of breath dyspnea     HEMATOLOGY/ONCOLOGY HISTORY:  Oncology History  CA of prostate ([place])  06/01/2013 Initial Diagnosis   CA of prostate ([place])    Chemotherapy      Radiation Therapy       ALLERGIES:  is allergic to mevacor [lovastatin], niacin and related, statins, and aleve [naproxen sodium].  MEDICATIONS:  Current Outpatient Medications  Medication Sig Dispense Refill   darolutamide (NUBEQA) 300 MG tablet Take 2 tablets (600 mg total) by mouth 2 (two) times daily with a meal. 120 tablet 1   No current facility-administered medications for this visit.    VITAL SIGNS: There were no vitals taken for this visit. There were no vitals filed for this visit.  Estimated body mass index is 20.82 kg/m as calculated from the following:   Height as of 03/10/21: 5\' 9"  (1.753 m).   Weight as of an earlier encounter on 11/23/21: 64 kg (141  lb).  LABS: CBC:    Component Value Date/Time   WBC 3.7 (L) 11/23/2021 0819   HGB 12.9 (L) 11/23/2021 0819   HGB 14.1 03/05/2019 0913   HCT 40.6 11/23/2021 0819   HCT 41.6 03/05/2019 0913   PLT 171 11/23/2021 0819   PLT 293 03/05/2019 0913   MCV 88.3 11/23/2021 0819   MCV 85 03/05/2019 0913   NEUTROABS 1.2 (L) 11/23/2021 0819   NEUTROABS 2.6 03/05/2019 0913   LYMPHSABS 1.9 11/23/2021 0819   LYMPHSABS 1.8 03/05/2019 0913   MONOABS 0.5 11/23/2021 0819   EOSABS 0.0 11/23/2021 0819   EOSABS 0.1 03/05/2019 0913   BASOSABS 0.0 11/23/2021 0819   BASOSABS 0.0 03/05/2019 0913   Comprehensive Metabolic Panel:    Component Value Date/Time   NA 139 11/23/2021 0819   NA 144 03/05/2019 0913   K 4.1 11/23/2021 0819   CL 101 11/23/2021 0819   CO2 30 11/23/2021 0819   BUN 14 11/23/2021 0819   BUN 16 03/05/2019 0913   CREATININE 0.90 11/23/2021 0819   GLUCOSE 173 (H) 11/23/2021 0819   CALCIUM 8.6 (L) 11/23/2021 0819   AST 16 11/23/2021 0819   ALT 8 11/23/2021 0819   ALKPHOS 71 11/23/2021 0819   BILITOT 0.5 11/23/2021 0819   BILITOT <0.2 03/05/2019 0913   PROT 6.9 11/23/2021 0819   PROT 6.2 03/05/2019 0913   ALBUMIN 3.9 11/23/2021 0819   ALBUMIN 4.3 03/05/2019 0913     Present during today's visit: patient only  Assessment and Plan: Continue  darolutamide 600mg  twice daily  ANC has improved to 1.2 K/uL, no dose adjustment needed at this time, continue to monitor   Oral Chemotherapy Side Effect/Intolerance:  Fatigue: Patient now reporting fatigue, 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 Hard to determine the cause of the fatigue, continue to monitor for the need to dose adjust for fatigue No reported rash  Oral Chemotherapy Adherence: no missed doses reported. No patient barriers to medication adherence identified.   New medications: none reported  Medication Access Issues: No issues, patient reports having medication on  hand for December. Bethena Roys was able to obtain a grant for 2023 medication access.  Patient expressed understanding and was in agreement with this plan. He also understands that He can call clinic at any time with any questions, concerns, or complaints.   Follow-up plan: RTC in 2 weeks  Thank you for allowing me to participate in the care of this very pleasant patient.   Time Total: 15 mins  Visit consisted of counseling and education on dealing with issues of symptom management in the setting of serious and potentially life-threatening illness.Greater than 50%  of this time was spent counseling and coordinating care related to the above assessment and plan.  Signed by: Darl Pikes, PharmD, BCPS, Salley Slaughter, CPP Hematology/Oncology Clinical Pharmacist Practitioner [facility]/DB/AP [facility] [phone]  11/23/2021 9:55 AM

## 2021-11-23 NOTE — Progress Notes (Signed)
[place] at [facility] [phone] [phone]   Name: Sean Wood. Date: 11/23/2021 MRN: [id number]  DOB: 08-13-1936  Patient Care Team: Chrismon, Vickki Muff, [name] (Inactive) as PCP - General (Physician Assistant) Garrel Ridgel, DPM as Consulting Physician (Podiatry) Noreene Filbert, MD as Referring Physician (Radiation Oncology) Idelle Leech, OD as Consulting Physician (Optometry) Jontez Server, MD as Consulting Physician (Oncology)    REASON FOR CONSULTATION: Sean Wood. is a 85 y.o. male with multiple medical problems including COPD and prostate cancer status post brachytherapy.  Local recurrence in October 2022 now on darolutamide.  Palliative care was consulted to help address advance care planning.  SOCIAL HISTORY:     reports that he quit smoking about 40 years ago. His smoking use included cigarettes. He smoked an average of 1.5 packs per day. His smokeless tobacco use includes chew. He reports that he does not currently use alcohol. He reports that he does not use drugs.  Patient is married and lives at home with his wife who has advanced dementia.  He has her full-time caregiver.  Patient has a son and his wife has a son and daughter.  Patient was a truck Radio broadcast assistant.  ADVANCE DIRECTIVES:  Not on file  CODE STATUS: DNR/DNI (MOST form completed on 11/23/2021)  PAST MEDICAL HISTORY: Past Medical History:  Diagnosis Date   Arthritis    COPD (chronic obstructive pulmonary disease) (HCC)    Elevated PSA    Hematuria, gross    History of hiatal hernia    HOH (hard of hearing)    Kidney stone    Prostate cancer ([place])    Shortness of breath dyspnea     PAST SURGICAL HISTORY:  Past Surgical History:  Procedure Laterality Date   [place]  06/24/2015   Procedure: CYSTOSCOPY;  Surgeon: Hollice Espy, MD;  Location: [facility] ORS;  Service: Urology;;    HERNIA REPAIR     left and right inguinal hernia repair   RADIOACTIVE SEED IMPLANT N/A 06/24/2015   Procedure: RADIOACTIVE SEED IMPLANT/BRACHYTHERAPY IMPLANT;  Surgeon: Hollice Espy, MD;  Location: [facility] ORS;  Service: Urology;  Laterality: N/A;    HEMATOLOGY/ONCOLOGY HISTORY:  Oncology History  CA of prostate ([place])  06/01/2013 Initial Diagnosis   CA of prostate ([place])    Chemotherapy      Radiation Therapy       ALLERGIES:  is allergic to mevacor [lovastatin], niacin and related, statins, and aleve [naproxen sodium].  MEDICATIONS:  Current Outpatient Medications  Medication Sig Dispense Refill   darolutamide (NUBEQA) 300 MG tablet Take 2 tablets (600 mg total) by mouth 2 (two) times daily with a meal. 120 tablet 1   No current facility-administered medications for this visit.    VITAL SIGNS: There were no vitals taken for this visit. There were no vitals filed for this visit.  Estimated body mass index is 21.12 kg/m as calculated from the following:   Height as of 03/10/21: 5' 9"  (1.753 m).   Weight as of 11/05/21: 143 lb (64.9 kg).  LABS: CBC:    Component Value Date/Time   WBC 3.7 (L) 11/23/2021 0819   HGB 12.9 (L) 11/23/2021 0819   HGB 14.1 03/05/2019 0913   HCT 40.6 11/23/2021 0819   HCT 41.6 03/05/2019 0913   PLT 171 11/23/2021 0819   PLT 293 03/05/2019 0913   MCV 88.3 11/23/2021 0819  MCV 85 03/05/2019 0913   NEUTROABS 1.2 (L) 11/23/2021 0819   NEUTROABS 2.6 03/05/2019 0913   LYMPHSABS 1.9 11/23/2021 0819   LYMPHSABS 1.8 03/05/2019 0913   MONOABS 0.5 11/23/2021 0819   EOSABS 0.0 11/23/2021 0819   EOSABS 0.1 03/05/2019 0913   BASOSABS 0.0 11/23/2021 0819   BASOSABS 0.0 03/05/2019 0913   Comprehensive Metabolic Panel:    Component Value Date/Time   NA 139 11/05/2021 0857   NA 144 03/05/2019 0913   K 4.1 11/05/2021 0857   CL 98 11/05/2021 0857   CO2 32 11/05/2021 0857   BUN 19 11/05/2021 0857   BUN 16 03/05/2019 0913   CREATININE 0.94 11/05/2021  0857   GLUCOSE 114 (H) 11/05/2021 0857   CALCIUM 9.4 11/05/2021 0857   AST 18 11/05/2021 0857   ALT 8 11/05/2021 0857   ALKPHOS 74 11/05/2021 0857   BILITOT 0.4 11/05/2021 0857   BILITOT <0.2 03/05/2019 0913   PROT 7.2 11/05/2021 0857   PROT 6.2 03/05/2019 0913   ALBUMIN 4.2 11/05/2021 0857   ALBUMIN 4.3 03/05/2019 0913    RADIOGRAPHIC STUDIES: No results found.  PERFORMANCE STATUS (ECOG) : 1 - Symptomatic but completely ambulatory  Review of Systems Unless otherwise noted, a complete review of systems is negative.  Physical Exam General: NAD Pulmonary: Exertionally labored Extremities: no edema, no joint deformities Skin: no rashes Neurological: Weakness but otherwise nonfocal  IMPRESSION: I met with patient at Dr. Collie Siad request to discuss advance care planning.  Patient reports that he is fatigued constantly from the care of his wife who has advanced dementia.  At baseline, his wife is verbal but weak and requires assistance with standing and ambulating.  She also requires her husband to provide all of the ADLs.  These tasks are limited by patient's COPD as he easily fatigues and becomes dyspneic on exertion.  He is not currently on inhalers or COPD controlled meds but says that he has taken some in the past.  He says that he saw a pulmonologist in the remote past but has not had follow-up and would like referral to someone new.  I will consult palliative care to assist with resource coordination in the home.  It sounds like his wife would also benefit from a palliative care consultation.  Patient states clearly and repeatedly that he would not want to be resuscitated nor have his life prolonged artificially on machines.  He would be okay with short-term hospitalization to treat the treatable.  He would want to be a DNR/DNI.  I completed a MOST form today. The patient outlined their wishes for the following treatment decisions:  Cardiopulmonary Resuscitation: Do Not Attempt  Resuscitation (DNR/No CPR)  Medical Interventions: Limited Additional Interventions: Use medical treatment, IV fluids and cardiac monitoring as indicated, DO NOT USE intubation or mechanical ventilation. May consider use of less invasive airway support such as BiPAP or CPAP. Also provide comfort measures. Transfer to the hospital if indicated. Avoid intensive care.   Antibiotics: Antibiotics if indicated  IV Fluids: IV fluids if indicated  Feeding Tube: No feeding tube   PLAN: -Continue current scope of treatment -DNR/DNI -MOST form completed -Patient sent home with [facility] POA documents -Referral to palliative care for resource coordination -Referral to pulmonary. -RTC as needed  Case and plan discussed with Dr. Tasia Catchings   Patient expressed understanding and was in agreement with this plan. He also understands that He can call the clinic at any time with any questions, concerns, or complaints.  Time Total: 30 minutes  Visit consisted of counseling and education dealing with the complex and emotionally intense issues of symptom management and palliative care in the setting of serious and potentially life-threatening illness.Greater than 50%  of this time was spent counseling and coordinating care related to the above assessment and plan.  Signed by: Altha Harm, PhD, NP-C

## 2021-11-23 NOTE — Progress Notes (Signed)
Palliative care visit. Pt reports fatigue and weakness. States he feels " worn out"; has to take care of his wife, who has dementia. Denies pain.

## 2021-11-24 ENCOUNTER — Telehealth: Payer: Self-pay | Admitting: Nurse Practitioner

## 2021-11-24 NOTE — Telephone Encounter (Signed)
Spoke with patient regarding the Palliative referral/services and he was in agreement with scheduling visit.  I have scheduled an In-home Consult for 12/08/21 @ 12:30 PM.

## 2021-11-26 ENCOUNTER — Other Ambulatory Visit (HOSPITAL_COMMUNITY): Payer: Self-pay

## 2021-11-26 ENCOUNTER — Encounter: Payer: Self-pay | Admitting: Oncology

## 2021-12-04 ENCOUNTER — Other Ambulatory Visit: Payer: Self-pay

## 2021-12-04 ENCOUNTER — Inpatient Hospital Stay (HOSPITAL_BASED_OUTPATIENT_CLINIC_OR_DEPARTMENT_OTHER): Payer: PPO | Admitting: Oncology

## 2021-12-04 ENCOUNTER — Other Ambulatory Visit: Payer: Self-pay | Admitting: Hospice and Palliative Medicine

## 2021-12-04 ENCOUNTER — Inpatient Hospital Stay: Payer: PPO

## 2021-12-04 VITALS — BP 127/72 | HR 67 | Temp 96.7°F | Resp 18 | Wt 142.2 lb

## 2021-12-04 DIAGNOSIS — C61 Malignant neoplasm of prostate: Secondary | ICD-10-CM | POA: Diagnosis not present

## 2021-12-04 DIAGNOSIS — Z7189 Other specified counseling: Secondary | ICD-10-CM

## 2021-12-04 DIAGNOSIS — R5383 Other fatigue: Secondary | ICD-10-CM | POA: Diagnosis not present

## 2021-12-04 DIAGNOSIS — Z79818 Long term (current) use of other agents affecting estrogen receptors and estrogen levels: Secondary | ICD-10-CM

## 2021-12-04 LAB — COMPREHENSIVE METABOLIC PANEL
ALT: 7 U/L (ref 0–44)
AST: 16 U/L (ref 15–41)
Albumin: 4 g/dL (ref 3.5–5.0)
Alkaline Phosphatase: 74 U/L (ref 38–126)
Anion gap: 8 (ref 5–15)
BUN: 16 mg/dL (ref 8–23)
CO2: 29 mmol/L (ref 22–32)
Calcium: 8.6 mg/dL — ABNORMAL LOW (ref 8.9–10.3)
Chloride: 103 mmol/L (ref 98–111)
Creatinine, Ser: 0.82 mg/dL (ref 0.61–1.24)
GFR, Estimated: >60 mL/min (ref >60)
Glucose, Bld: 106 mg/dL — ABNORMAL HIGH (ref 70–99)
Potassium: 4 mmol/L (ref 3.5–5.1)
Sodium: 140 mmol/L (ref 135–145)
Total Bilirubin: 0.5 mg/dL (ref 0.3–1.2)
Total Protein: 7.3 g/dL (ref 6.5–8.1)

## 2021-12-04 LAB — CBC WITH DIFFERENTIAL/PLATELET
Abs Immature Granulocytes: 0.05 10*3/uL (ref 0.00–0.07)
Basophils Absolute: 0 10*3/uL (ref 0.0–0.1)
Basophils Relative: 0 %
Eosinophils Absolute: 0 10*3/uL (ref 0.0–0.5)
Eosinophils Relative: 0 %
HCT: 39.5 % (ref 39.0–52.0)
Hemoglobin: 12.5 g/dL — ABNORMAL LOW (ref 13.0–17.0)
Immature Granulocytes: 1 %
Lymphocytes Relative: 45 %
Lymphs Abs: 2.3 10*3/uL (ref 0.7–4.0)
MCH: 27.9 pg (ref 26.0–34.0)
MCHC: 31.6 g/dL (ref 30.0–36.0)
MCV: 88.2 fL (ref 80.0–100.0)
Monocytes Absolute: 1 10*3/uL (ref 0.1–1.0)
Monocytes Relative: 20 %
Neutro Abs: 1.7 10*3/uL (ref 1.7–7.7)
Neutrophils Relative %: 34 %
Platelets: 278 10*3/uL (ref 150–400)
RBC: 4.48 MIL/uL (ref 4.22–5.81)
RDW: 13.9 % (ref 11.5–15.5)
WBC: 5.1 10*3/uL (ref 4.0–10.5)
nRBC: 0 % (ref 0.0–0.2)

## 2021-12-04 LAB — PSA: Prostatic Specific Antigen: 2.32 ng/mL (ref 0.00–4.00)

## 2021-12-04 MED ORDER — OYSTER SHELL CALCIUM/D3 500-5 MG-MCG PO TABS: 2.0000 | ORAL_TABLET | Freq: Every day | ORAL | 1 refills | Status: DC

## 2021-12-04 NOTE — Progress Notes (Signed)
Pt here for follow up. No new concerns voiced.   

## 2021-12-05 ENCOUNTER — Encounter: Payer: Self-pay | Admitting: Oncology

## 2021-12-05 DIAGNOSIS — R5383 Other fatigue: Secondary | ICD-10-CM | POA: Insufficient documentation

## 2021-12-05 NOTE — Progress Notes (Signed)
Hematology/Oncology Follow up note [phone] [phone]   Patient Care Team: Chrismon, Vickki Muff, [name] (Inactive) as PCP - General (Physician Assistant) Garrel Ridgel, DPM as Consulting Physician (Podiatry) Noreene Filbert, MD as Referring Physician (Radiation Oncology) Idelle Leech, OD as Consulting Physician (Optometry) Younis Server, MD as Consulting Physician (Oncology) REASON FOR VISIT Follow up for treatment of biochemical recurrence of prostate cancer.  HISTORY OF PRESENTING ILLNESS:  Sean Hornbaker. is a  85 y.o.  male with PMH listed below who was referred to me for evaluation of prostate cancer.   Cancer history dated back to 2009 when He was diagnosed with intermediate risk, Gleason 3+4, T1c prostate cancer in 2009. PSA at diagnosis was 6.1. IMRT was completed in August 2009. PSA nadir was 0.5. His PSA January 2013 was 0.8 and in January 2014 was 1.3. This was repeated in May 2014 and had increased to 1.8. He elected a repeat prostate biopsy in November 2014 after his PSA in October had increased to 2.2. This showed a focus of Gleason 3+3 adenocarcinoma from the right prostate involving less than 5%. He elected surveillance after discussing options. In May 2016, he was evaluated by Dr.Chrystal and he received salvage seed implantation with I-125. He subsequently follows with Dr.Chrystal for Lupron every 4 months since then. Achieve PSA nadir of 0.06 in 09/2016, and started to trend up and most recently trended up to 9.64.   # Lupron 30 mg on 05/10/2018, 06/14/2018 testosterone level was at 6  # Leupron 22.5mg  11/06/2018.  Delayed as patient reports feeling very fatigued and we discussed about holding Lupron.  74-month and restarted on 11/06/2018.  10/06/2021, PSMA PET scan showed marked radiotracer accumulation with elevated SUV in the prostate/prostate bed tracking towards the bladder base with distortion of the urinary bladder.  Signs of brachytherapy in the  prostate as well.  No definitive signs of extraprostatic disease.  A single lymph node in the left the pelvis with only minimally elevated SUV.  Equivocal.  Aortic atherosclerosis.  11/ 12/2020 Darolutamide  INTERVAL HISTORY Sean Wood. is a 85 y.o. male who has above history reviewed by me present for follow-up of management of biochemical recurrence of prostate cancer He continues to feel fatigued, worse after starting  Darolutamide. No nausea, vomiting, diarrhea.  He is exhausted being the primary care giver for his wife who has dementia.  Chronic SOB, he has been referred to pulmonology and has appt in late Dec 2022.       Review of Systems  Constitutional:  Positive for malaise/fatigue. Negative for chills, fever and weight loss.  HENT:  Negative for sore throat.   Eyes:  Negative for redness.  Respiratory:  Positive for shortness of breath. Negative for cough and wheezing.   Cardiovascular:  Negative for chest pain, palpitations and leg swelling.  Gastrointestinal:  Negative for abdominal pain, blood in stool, nausea and vomiting.  Genitourinary:  Negative for dysuria.  Musculoskeletal:  Negative for myalgias.  Skin:  Negative for rash.  Neurological:  Negative for dizziness, tingling and tremors.  Endo/Heme/Allergies:  Does not bruise/bleed easily.  Psychiatric/Behavioral:  Negative for hallucinations.    MEDICAL HISTORY:  Past Medical History:  Diagnosis Date   Arthritis    COPD (chronic obstructive pulmonary disease) (HCC)    Elevated PSA    Hematuria, gross    History of hiatal hernia    HOH (hard of hearing)    Kidney stone    Prostate cancer (  [place])    Shortness of breath dyspnea     SURGICAL HISTORY: Past Surgical History:  Procedure Laterality Date   [place]  06/24/2015   Procedure: CYSTOSCOPY;  Surgeon: Hollice Espy, MD;  Location: [facility] ORS;  Service: Urology;;   HERNIA REPAIR     left and right inguinal hernia repair    RADIOACTIVE SEED IMPLANT N/A 06/24/2015   Procedure: RADIOACTIVE SEED IMPLANT/BRACHYTHERAPY IMPLANT;  Surgeon: Hollice Espy, MD;  Location: [facility] ORS;  Service: Urology;  Laterality: N/A;    SOCIAL HISTORY: Social History   Socioeconomic History   Marital status: Married    Spouse name: Not on file   Number of children: 1   Years of education: Not on file   Highest education level: 8th grade  Occupational History   Occupation: retired  Tobacco Use   Smoking status: Former    Packs/day: 1.50    Types: Cigarettes    Quit date: 05/24/1981    Years since quitting: 40.5   Smokeless tobacco: Current    Types: Chew  Vaping Use   Vaping Use: Never used  Substance and Sexual Activity   Alcohol use: Not Currently    Alcohol/week: 0.0 standard drinks   Drug use: No   Sexual activity: Not on file  Other Topics Concern   Not on file  Social History Narrative   Not on file   Social Determinants of Health   Financial Resource Strain: Not on file  Food Insecurity: Not on file  Transportation Needs: Not on file  Physical Activity: Not on file  Stress: Not on file  Social Connections: Not on file  Intimate Partner Violence: Not on file    FAMILY HISTORY: Family History  Problem Relation Age of Onset   Cancer Brother    Heart disease Brother    Diabetes Brother    Emphysema Brother    Prostate cancer Brother    Diabetes Sister    Emphysema Mother    Healthy Sister    Healthy Sister    Healthy Sister    Diabetes Brother    Diabetes Brother    Diabetes Brother    Healthy Brother     ALLERGIES:  is allergic to [employer] [lovastatin], niacin and related, statins, and aleve [naproxen sodium].  MEDICATIONS:  Current Outpatient Medications  Medication Sig Dispense Refill   calcium-vitamin D (OSCAL WITH D) 500-5 MG-MCG tablet Take 2 tablets by mouth daily. 90 tablet 1   darolutamide (NUBEQA) 300 MG tablet Take 2 tablets (600 mg total) by mouth 2 (two) times daily with a  meal. 120 tablet 1   No current facility-administered medications for this visit.     PHYSICAL EXAMINATION: ECOG PERFORMANCE STATUS: 1 - Symptomatic but completely ambulatory Vitals:   12/04/21 1130  BP: 127/72  Pulse: 67  Resp: 18  Temp: (!) 96.7 F (35.9 C)   Filed Weights   12/04/21 1130  Weight: 142 lb 3.2 oz (64.5 kg)     Physical Exam Constitutional:      General: He is not in acute distress.    Comments: Patient walks independently.  HENT:     Head: Normocephalic and atraumatic.  Eyes:     General: No scleral icterus.    Conjunctiva/sclera: Conjunctivae normal.     Pupils: Pupils are equal, round, and reactive to light.  Cardiovascular:     Rate and Rhythm: Normal rate and regular rhythm.     Heart sounds: Normal heart  sounds.  Pulmonary:     Effort: Pulmonary effort is normal. No respiratory distress.     Breath sounds: No wheezing or rales.     Comments: Decreased breath sound bilaterally Chest:     Chest wall: No tenderness.  Abdominal:     General: Bowel sounds are normal. There is no distension.     Palpations: Abdomen is soft. There is no mass.     Tenderness: There is no abdominal tenderness.  Musculoskeletal:        General: No deformity. Normal range of motion.     Cervical back: Normal range of motion and neck supple.  Lymphadenopathy:     Cervical: No cervical adenopathy.  Skin:    General: Skin is warm and dry.     Findings: No erythema or rash.  Neurological:     Mental Status: He is alert and oriented to person, place, and time. Mental status is at baseline.     Cranial Nerves: No cranial nerve deficit.     Coordination: Coordination normal.  Psychiatric:        Mood and Affect: Mood normal.     LABORATORY DATA:  I have reviewed the data as listed Lab Results  Component Value Date   WBC 5.1 12/04/2021   HGB 12.5 (L) 12/04/2021   HCT 39.5 12/04/2021   MCV 88.2 12/04/2021   PLT 278 12/04/2021   Recent Labs    11/05/21 0857  11/23/21 0819 12/04/21 1112  NA 139 139 140  K 4.1 4.1 4.0  CL 98 101 103  CO2 32 30 29  GLUCOSE 114* 173* 106*  BUN 19 14 16   CREATININE 0.94 0.90 0.82  CALCIUM 9.4 8.6* 8.6*  GFRNONAA >60 >60 >60  PROT 7.2 6.9 7.3  ALBUMIN 4.2 3.9 4.0  AST 18 16 16   ALT 8 8 7   ALKPHOS 74 71 74  BILITOT 0.4 0.5 0.5    RADIOGRAPHIC STUDIES: I have personally reviewed the radiological images as listed and agreed with the findings in the report. NM PET (PSMA) SKULL TO MID THIGH  Result Date: 10/07/2021 CLINICAL DATA:  History of prostate neoplasm diagnosed in 2009. Initially treated with IMRT, subsequent salvage therapy in 2016 with seed implantation. Lowest PSA 0.06 with most recent elevated PSA of 9.64. EXAM: NUCLEAR MEDICINE PET SKULL BASE TO THIGH TECHNIQUE: 9.2 mCi F18 Piflufolastat (Pylarify) was injected intravenously. Full-ring PET imaging was performed from the skull base to thigh after the radiotracer. CT data was obtained and used for attenuation correction and anatomic localization. COMPARISON:  Chest abdomen pelvis CT from 2019. FINDINGS: NECK No radiotracer activity in neck lymph nodes. Incidental CT finding: None CHEST No radiotracer accumulation within mediastinal or hilar lymph nodes. No suspicious pulmonary nodules on the CT scan. Incidental CT finding: Calcified atheromatous plaque in the thoracic aorta. Three-vessel coronary artery disease. Normal heart size. No substantial pericardial effusion. Normal caliber of central pulmonary vessels. Limited assessment of cardiovascular structures given lack of intravenous contrast. Signs of pulmonary emphysema that is worse towards the lung apices. Airways are patent. Biapical scarring is noted. ABDOMEN/PELVIS Prostate: Marked radiotracer accumulation in the prostate gland/bed. Maximum SUV of 21.5 encompassing the entire region of the prostate area measuring approximately 2.1 x 2.3 cm (image 254/3). Radiotracer tracks towards the base of the urinary  bladder which show some distortion. Lymph nodes: Tiny LEFT pelvic sidewall lymph node (image 106/6 and image 224/3 with a maximum SUV of 2.37. This measures approximately 3-4 mm. Liver: No evidence  of liver metastasis Incidental CT finding: No acute findings relative to liver, gallbladder, spleen, pancreas, adrenal glands or kidneys. Gastrointestinal tract is unremarkable aside from colonic diverticulosis. Aortic atherosclerosis without aneurysm. No adenopathy by size criteria in the retroperitoneum or in the pelvis. Brachytherapy seeds in the prostate gland. SKELETON No radiotracer accumulation to suggest skeletal metastasis. IMPRESSION: Marked radiotracer accumulation with elevated SUV in the prostate/prostate bed tracking towards the bladder base with distortion of the urinary bladder. Signs of brachytherapy in the prostate as well. No definite signs of extraprostatic disease at this time. Single lymph node in the LEFT pelvis with only minimally elevated SUV is equivocal, attention on follow-up. Aortic Atherosclerosis (ICD10-I70.0). Electronically Signed   By: Zetta Bills M.D.   On: 10/07/2021 09:16     ASSESSMENT & PLAN:  1. Malignant neoplasm of prostate ([place])   2. Goals of care, counseling/discussion   3. Other fatigue   4. Hypocalcemia    # Prostate Cancer with biochemical recurrence, asymptomatic, castration resistant nonmetastatic.  Labs are reviewed and discussed with patient. PSA trended down. Continue Darolutamide 600mg  BID.  ADT with Eligard 30mg  Q38months, due in Feb 2023.   # COPD/Emphysema, recommend smoke cessation. Establish care with pulmonology # Hypocalcemia, recommend calcium supplementation.   #Neutropenia, resolved  Continue current regimen. # Fatigue, multifactorial, from medication side effects, COPD, life stress/social issue.   palliative care to assist with resource coordination in the home Follow up  Lab MD 4 weeks.   Orders Placed This Encounter  Procedures    CBC with Differential/Platelet    Standing Status:   Future    Standing Expiration Date:   12/04/2022   Comprehensive metabolic panel    Standing Status:   Future    Standing Expiration Date:   12/04/2022   PSA    Standing Status:   Future    Standing Expiration Date:   12/04/2022    Alamin Server, MD, PhD 12/05/2021

## 2021-12-08 ENCOUNTER — Encounter: Payer: Self-pay | Admitting: Nurse Practitioner

## 2021-12-08 ENCOUNTER — Other Ambulatory Visit: Payer: Self-pay

## 2021-12-08 ENCOUNTER — Other Ambulatory Visit: Payer: PPO | Admitting: Nurse Practitioner

## 2021-12-08 DIAGNOSIS — Z515 Encounter for palliative care: Secondary | ICD-10-CM | POA: Diagnosis not present

## 2021-12-08 DIAGNOSIS — R5383 Other fatigue: Secondary | ICD-10-CM | POA: Diagnosis not present

## 2021-12-08 NOTE — Progress Notes (Signed)
[facility] Palliative Care Consult Note Telephone: [phone]  Fax: [phone]   Date of encounter: 12/08/21 3:45 PM PATIENT NAME: Sean Wood [id number]   [phone] (home)  DOB: [birth year] MRN: [id number] PRIMARY CARE PROVIDER:    Margo Common, [name] (Inactive),   REFERRING PROVIDER:   Borders, Kirt Boys, NP [place],   [id number] [phone]  RESPONSIBLE PARTY:    Contact Information     Name Relation Home Work [place] Spouse [phone]  [phone]   Leontine Locket   [phone]      I met face to face with patient and family in home. Palliative Care was asked to follow this patient by consultation request of  Borders, Kirt Boys, NP to address advance care planning and complex medical decision making. This is the initial visit.  ASSESSMENT AND PLAN / RECOMMENDATIONS: Symptom Management/Plan: 1. Advance Care Planning; DNR; discussed goc, continue current treatments  2. Goals of Care: Goals include to maximize quality of life and symptom management. Our advance care planning conversation included a discussion about:    The value and importance of advance care planning  Exploration of personal, cultural or spiritual beliefs that might influence medical decisions  Exploration of goals of care in the event of a sudden injury or illness  Identification and preparation of a healthcare agent  Review and updating or creation of an advance directive document.  3. Palliative care encounter; Palliative care encounter; Palliative medicine team will continue to support patient, patient's family, and medical team. Visit consisted of counseling and education dealing with the complex and emotionally intense issues of symptom management and palliative care in the setting of serious and potentially life-threatening illness  4. Fatigue secondary to prostate  cancer with ongoing treatment; discussed at length about sleep patterns, hygiene, importance of self care, resting, will do PC SW consult for further exploration of what assistance can be obtained through insurance as he is primary caregiver for his wife with dementia.  5. f/u 1 month for ongoing monitoring chronic disease progression, ongoing discussions complex medical decision making  Follow up Palliative Care Visit: Palliative care will continue to follow for complex medical decision making, advance care planning, and clarification of goals. Return 8 weeks or prn.  I spent 77 minutes providing this consultation. More than 50% of the time in this consultation was spent in counseling and care coordination.  PPS: 60%  HOSPICE ELIGIBILITY/DIAGNOSIS: TBD  Chief Complaint: Initial Palliative consult for complex medical decision making  HISTORY OF PRESENT ILLNESS:  Sean Scoggins. is a 85 y.o. year old male  with multiple medical problems including prostate cancer s/p post brachytherapy with reoccurance 09/2021 on darolutamide, COPD, arthritis, h/o gross hematuria, h/o hiatial hernia, oh, h/o kidney stone. I called Sean Wood to confirm PC initial visit, covid screening negative. I visited Sean Wood in their home. Sean Wood was ambulatory, slow with ambulation. We talked about past medical history, dx prostate cancer with treatments he has undergone and current. We talked about last Oncology visit. We talked about symptoms including pain, fatigue, ros. We talked about appetite, nutrition. We talked about medical goals. We talked about his functional abilities. Sean Wood continues to care for his wife at home. Sean Wood does the grocery shopping which makes him very tired as well as the house keeping, meals. Sean Wood endorses it is hard to try to do everything.  Sean Wood talked about his insurance with long term policy. We talked about option of PC SW to consult. Sean Wood was  in agreement. Sean Wood is a DNR. We talked about role pc in poc. We talked about life review, social and family hx. We talked about quality of life. We talked about f/u pc visit, Sean Wood in agreement. Scheduled. Therapeutic listening, emotional support provide.d Questions answered.   SOCIAL HISTORY:  Quit smoking about 40 years ago, cigarettes. Smoked an average of 1.5 packs per day,tobacco use includes chew. He reports that he does not currently use alcohol. He reports that he does not use drugs.   Married and lives at home with his wife who has advanced dementia.  He has her full-time caregiver.  Sean Wood has a son and his wife has a son and daughter.  Patient was a truck Radio broadcast assistant. History obtained from review of EMR, discussion with Sean Wood.  I reviewed available labs, medications, imaging, studies and related documents from the EMR.  Records reviewed and summarized above.   ROS Full 10 system review of systems performed and negative with exception of: as per HPI.   Physical Exam: Constitutional: NAD General: frail appearing, thin, pleasant male EYES: lids intact ENMT: oral mucous membranes moist CV: S1S2, RRR Pulmonary: LCTA, no increased work of breathing, no cough, room air Abdomen: normo-active BS + 4 quadrants, soft and non tender MSK: ambulatory Skin: warm and dry Neuro:  + generalized weakness,  no cognitive impairment Psych: non-anxious affect, A and O x 3  CURRENT PROBLEM LIST:  Patient Active Problem List   Diagnosis Date Noted   Other fatigue 12/05/2021   Actinic keratosis 04/10/2021   Herpes zoster with other nervous system complications 02/63/7858   Low back pain 04/10/2021   Other ill-defined and unknown causes of morbidity and mortality 04/10/2021   Encounter for monitoring androgen deprivation therapy 06/07/2019   Goals of care, counseling/discussion 05/18/2018   Acute exacerbation of chronic obstructive airways disease  ([place]) 08/08/2015   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[place]) 08/08/2015   Hypercholesteremia 08/08/2015   Psoriasis 08/08/2015   Abnormal prostate specific antigen 06/01/2013   CA of prostate ([place]) 06/01/2013   Elevated prostate specific antigen (PSA) 06/01/2013   Malignant neoplasm of prostate ([place]) 06/01/2013   History of colon polyps 04/02/2010   H/O malignant neoplasm of prostate 04/02/2010   PAST MEDICAL HISTORY:  Active Ambulatory Problems    Diagnosis Date Noted   Abnormal prostate specific antigen 06/01/2013   CA of prostate ([place]) 06/01/2013   Acute exacerbation of chronic obstructive airways disease ([place]) 08/08/2015   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[place]) 08/08/2015   History of colon polyps 04/02/2010   Hypercholesteremia 08/08/2015   H/O malignant neoplasm of prostate 04/02/2010   Psoriasis 08/08/2015   Elevated prostate specific antigen (PSA) 06/01/2013   Malignant neoplasm of prostate ([place]) 06/01/2013   Goals of care, counseling/discussion 05/18/2018   Encounter for monitoring androgen deprivation therapy 06/07/2019   Actinic keratosis 04/10/2021   Herpes zoster with other nervous system complications 85/01/7740   Low back pain 04/10/2021   Other ill-defined and unknown causes of morbidity and mortality 04/10/2021   Other fatigue 12/05/2021   Resolved Ambulatory Problems    Diagnosis Date Noted   No Resolved Ambulatory Problems   Past Medical History:  Diagnosis Date   Arthritis    COPD (chronic obstructive pulmonary disease) (HCC)    Elevated PSA    Hematuria, gross  History of hiatal hernia    HOH (hard of hearing)    Kidney stone    Prostate cancer ([place])    Shortness of breath dyspnea    SOCIAL HX:  Social History   Tobacco Use   Smoking status: Former    Packs/day: 1.50    Types: Cigarettes    Quit date: 05/24/1981    Years since quitting: 40.5   Smokeless tobacco: Current    Types: Chew  Substance Use Topics   Alcohol use: Not  Currently    Alcohol/week: 0.0 standard drinks   FAMILY HX:  Family History  Problem Relation Age of Onset   Cancer Brother    Heart disease Brother    Diabetes Brother    Emphysema Brother    Prostate cancer Brother    Diabetes Sister    Emphysema Mother    Healthy Sister    Healthy Sister    Healthy Sister    Diabetes Brother    Diabetes Brother    Diabetes Brother    Healthy Brother     reviewed  ALLERGIES:  Allergies  Allergen Reactions   Mevacor [Lovastatin] Other (See Comments)    "feel washed out"   Niacin And Related Other (See Comments)    "hot feeling"   Statins Other (See Comments)    "feel washed out"   Aleve [Naproxen Sodium] Rash     PERTINENT MEDICATIONS:  Outpatient Encounter Medications as of 12/08/2021  Medication Sig   calcium-vitamin D (OSCAL WITH D) 500-5 MG-MCG tablet Take 2 tablets by mouth daily.   darolutamide (NUBEQA) 300 MG tablet Take 2 tablets (600 mg total) by mouth 2 (two) times daily with a meal.   No facility-administered encounter medications on file as of 12/08/2021.  Questions and concerns were addressed. The patient/family was encouraged to call with questions and/or concerns. My business card was provided. Provided general support and encouragement, no other unmet needs identified   Thank you for the opportunity to participate in the care of Sean Wood.  The palliative care team will continue to follow. Please call our office at [phone] if we can be of additional assistance.   This chart was dictated using voice recognition software.  Despite best efforts to proofread,  errors can occur which can change the documentation meaning.   Laveah Gloster Z Shatonya Passon, NP ,   COVID-19 PATIENT SCREENING TOOL Asked and negative response unless otherwise noted:  Have you had symptoms of covid, tested positive or been in contact with someone with symptoms/positive test in the past 5-10 days? NO

## 2021-12-15 ENCOUNTER — Telehealth: Payer: Self-pay

## 2021-12-15 NOTE — Telephone Encounter (Signed)
12/15/21 @1PM : PC SW outreached patient to f/'u from [facility] NP- C. Gusler visit to address needs.   PC SW scheduled in person f/u visit for 01/04/22 @1pm  with PC SW/RN.

## 2021-12-18 DIAGNOSIS — J439 Emphysema, unspecified: Secondary | ICD-10-CM | POA: Diagnosis not present

## 2021-12-18 DIAGNOSIS — G8929 Other chronic pain: Secondary | ICD-10-CM | POA: Diagnosis not present

## 2021-12-18 DIAGNOSIS — Z87891 Personal history of nicotine dependence: Secondary | ICD-10-CM | POA: Diagnosis not present

## 2021-12-18 DIAGNOSIS — C61 Malignant neoplasm of prostate: Secondary | ICD-10-CM | POA: Diagnosis not present

## 2021-12-18 DIAGNOSIS — H259 Unspecified age-related cataract: Secondary | ICD-10-CM | POA: Diagnosis not present

## 2021-12-29 ENCOUNTER — Encounter: Payer: Self-pay | Admitting: Oncology

## 2021-12-30 ENCOUNTER — Encounter: Payer: Self-pay | Admitting: Oncology

## 2021-12-31 ENCOUNTER — Institutional Professional Consult (permissible substitution): Payer: PPO | Admitting: Pulmonary Disease

## 2022-01-01 ENCOUNTER — Inpatient Hospital Stay (HOSPITAL_BASED_OUTPATIENT_CLINIC_OR_DEPARTMENT_OTHER): Payer: PPO | Admitting: Oncology

## 2022-01-01 ENCOUNTER — Other Ambulatory Visit: Payer: Self-pay

## 2022-01-01 ENCOUNTER — Inpatient Hospital Stay: Payer: PPO | Attending: Oncology

## 2022-01-01 ENCOUNTER — Encounter: Payer: Self-pay | Admitting: Oncology

## 2022-01-01 VITALS — BP 148/76 | HR 65 | Temp 96.2°F | Wt 138.5 lb

## 2022-01-01 DIAGNOSIS — D709 Neutropenia, unspecified: Secondary | ICD-10-CM | POA: Insufficient documentation

## 2022-01-01 DIAGNOSIS — C61 Malignant neoplasm of prostate: Secondary | ICD-10-CM | POA: Insufficient documentation

## 2022-01-01 DIAGNOSIS — Z87891 Personal history of nicotine dependence: Secondary | ICD-10-CM | POA: Diagnosis not present

## 2022-01-01 DIAGNOSIS — R5383 Other fatigue: Secondary | ICD-10-CM | POA: Diagnosis not present

## 2022-01-01 DIAGNOSIS — J439 Emphysema, unspecified: Secondary | ICD-10-CM | POA: Diagnosis not present

## 2022-01-01 DIAGNOSIS — I700 Atherosclerosis of aorta: Secondary | ICD-10-CM | POA: Diagnosis not present

## 2022-01-01 DIAGNOSIS — Z5111 Encounter for antineoplastic chemotherapy: Secondary | ICD-10-CM

## 2022-01-01 LAB — CBC WITH DIFFERENTIAL/PLATELET
Abs Immature Granulocytes: 0.02 10*3/uL (ref 0.00–0.07)
Basophils Absolute: 0 10*3/uL (ref 0.0–0.1)
Basophils Relative: 0 %
Eosinophils Absolute: 0 10*3/uL (ref 0.0–0.5)
Eosinophils Relative: 0 %
HCT: 39.9 % (ref 39.0–52.0)
Hemoglobin: 12.8 g/dL — ABNORMAL LOW (ref 13.0–17.0)
Immature Granulocytes: 1 %
Lymphocytes Relative: 45 %
Lymphs Abs: 1.5 10*3/uL (ref 0.7–4.0)
MCH: 28.1 pg (ref 26.0–34.0)
MCHC: 32.1 g/dL (ref 30.0–36.0)
MCV: 87.5 fL (ref 80.0–100.0)
Monocytes Absolute: 0.5 10*3/uL (ref 0.1–1.0)
Monocytes Relative: 16 %
Neutro Abs: 1.3 10*3/uL — ABNORMAL LOW (ref 1.7–7.7)
Neutrophils Relative %: 38 %
Platelets: 150 10*3/uL (ref 150–400)
RBC: 4.56 MIL/uL (ref 4.22–5.81)
RDW: 14.1 % (ref 11.5–15.5)
WBC: 3.4 10*3/uL — ABNORMAL LOW (ref 4.0–10.5)
nRBC: 0 % (ref 0.0–0.2)

## 2022-01-01 LAB — COMPREHENSIVE METABOLIC PANEL
ALT: 8 U/L (ref 0–44)
AST: 18 U/L (ref 15–41)
Albumin: 4.3 g/dL (ref 3.5–5.0)
Alkaline Phosphatase: 66 U/L (ref 38–126)
Anion gap: 5 (ref 5–15)
BUN: 21 mg/dL (ref 8–23)
CO2: 32 mmol/L (ref 22–32)
Calcium: 9.2 mg/dL (ref 8.9–10.3)
Chloride: 101 mmol/L (ref 98–111)
Creatinine, Ser: 0.84 mg/dL (ref 0.61–1.24)
GFR, Estimated: >60 mL/min (ref >60)
Glucose, Bld: 112 mg/dL — ABNORMAL HIGH (ref 70–99)
Potassium: 4.3 mmol/L (ref 3.5–5.1)
Sodium: 138 mmol/L (ref 135–145)
Total Bilirubin: 0.6 mg/dL (ref 0.3–1.2)
Total Protein: 7.2 g/dL (ref 6.5–8.1)

## 2022-01-01 LAB — PSA: Prostatic Specific Antigen: 1.8 ng/mL (ref 0.00–4.00)

## 2022-01-01 MED ORDER — DAROLUTAMIDE 300 MG PO TABS
300.0000 mg | ORAL_TABLET | Freq: Two times a day (BID) | ORAL | 2 refills | Status: DC
Start: 2022-01-01 — End: 2022-01-07

## 2022-01-01 NOTE — Progress Notes (Signed)
Hematology/Oncology Follow up note [phone] [phone]   Patient Care Team: Chrismon, Vickki Muff, [name] (Inactive) as PCP - General (Physician Assistant) Garrel Ridgel, DPM as Consulting Physician (Podiatry) Noreene Filbert, MD as Referring Physician (Radiation Oncology) Idelle Leech, OD as Consulting Physician (Optometry) Kyson Server, MD as Consulting Physician (Oncology) REASON FOR VISIT Follow up for treatment of biochemical recurrence of prostate cancer.  HISTORY OF PRESENTING ILLNESS:  Sean Wood. is a  86 y.o.  male with PMH listed below who was referred to me for evaluation of prostate cancer.   Cancer history dated back to 2009 when He was diagnosed with intermediate risk, Gleason 3+4, T1c prostate cancer in 2009. PSA at diagnosis was 6.1. IMRT was completed in August 2009. PSA nadir was 0.5. His PSA January 2013 was 0.8 and in January 2014 was 1.3. This was repeated in May 2014 and had increased to 1.8. He elected a repeat prostate biopsy in November 2014 after his PSA in October had increased to 2.2. This showed a focus of Gleason 3+3 adenocarcinoma from the right prostate involving less than 5%. He elected surveillance after discussing options. In May 2016, he was evaluated by Dr.Chrystal and he received salvage seed implantation with I-125. He subsequently follows with Dr.Chrystal for Lupron every 4 months since then. Achieve PSA nadir of 0.06 in 09/2016, and started to trend up and most recently trended up to 9.64.   # Lupron 30 mg on 05/10/2018, 06/14/2018 testosterone level was at 6  # Leupron 22.5mg  11/06/2018.  Delayed as patient reports feeling very fatigued and we discussed about holding Lupron.  66-month and restarted on 11/06/2018.  10/06/2021, PSMA PET scan showed marked radiotracer accumulation with elevated SUV in the prostate/prostate bed tracking towards the bladder base with distortion of the urinary bladder.  Signs of brachytherapy in the  prostate as well.  No definitive signs of extraprostatic disease.  A single lymph node in the left the pelvis with only minimally elevated SUV.  Equivocal.  Aortic atherosclerosis.  11/ 12/2020 Darolutamide  INTERVAL HISTORY [name]. is a 86 y.o. male who has above history reviewed by me present for follow-up of management of biochemical recurrence of prostate cancer Ran out of Darolutamide in early January.  Request refills.  He feels fatigue was better since he is of the treatments.  He missed appointment with pulmonology due to not able to find someone to take care of his wife when he is away for the appointment.  He plans to reschedule.  No new complaints.      Review of Systems  Constitutional:  Positive for malaise/fatigue. Negative for chills, fever and weight loss.  HENT:  Negative for sore throat.   Eyes:  Negative for redness.  Respiratory:  Positive for shortness of breath. Negative for cough and wheezing.   Cardiovascular:  Negative for chest pain, palpitations and leg swelling.  Gastrointestinal:  Negative for abdominal pain, blood in stool, nausea and vomiting.  Genitourinary:  Negative for dysuria.  Musculoskeletal:  Negative for myalgias.  Skin:  Negative for rash.  Neurological:  Negative for dizziness, tingling and tremors.  Endo/Heme/Allergies:  Does not bruise/bleed easily.  Psychiatric/Behavioral:  Negative for hallucinations.    MEDICAL HISTORY:  Past Medical History:  Diagnosis Date   Arthritis    COPD (chronic obstructive pulmonary disease) (HCC)    Elevated PSA    Hematuria, gross    History of hiatal hernia    HOH (hard of  hearing)    Kidney stone    Prostate cancer ([place])    Shortness of breath dyspnea     SURGICAL HISTORY: Past Surgical History:  Procedure Laterality Date   [place]  06/24/2015   Procedure: CYSTOSCOPY;  Surgeon: Hollice Espy, MD;  Location: [facility] ORS;  Service: Urology;;   HERNIA REPAIR      left and right inguinal hernia repair   RADIOACTIVE SEED IMPLANT N/A 06/24/2015   Procedure: RADIOACTIVE SEED IMPLANT/BRACHYTHERAPY IMPLANT;  Surgeon: Hollice Espy, MD;  Location: [facility] ORS;  Service: Urology;  Laterality: N/A;    SOCIAL HISTORY: Social History   Socioeconomic History   Marital status: Married    Spouse name: Not on file   Number of children: 1   Years of education: Not on file   Highest education level: 8th grade  Occupational History   Occupation: retired  Tobacco Use   Smoking status: Former    Packs/day: 1.50    Types: Cigarettes    Quit date: 05/24/1981    Years since quitting: 40.6   Smokeless tobacco: Current    Types: Chew  Vaping Use   Vaping Use: Never used  Substance and Sexual Activity   Alcohol use: Not Currently    Alcohol/week: 0.0 standard drinks   Drug use: No   Sexual activity: Not on file  Other Topics Concern   Not on file  Social History Narrative   Not on file   Social Determinants of Health   Financial Resource Strain: Not on file  Food Insecurity: Not on file  Transportation Needs: Not on file  Physical Activity: Not on file  Stress: Not on file  Social Connections: Not on file  Intimate Partner Violence: Not on file    FAMILY HISTORY: Family History  Problem Relation Age of Onset   Cancer Brother    Heart disease Brother    Diabetes Brother    Emphysema Brother    Prostate cancer Brother    Diabetes Sister    Emphysema Mother    Healthy Sister    Healthy Sister    Healthy Sister    Diabetes Brother    Diabetes Brother    Diabetes Brother    Healthy Brother     ALLERGIES:  is allergic to [employer] [lovastatin], niacin and related, statins, and aleve [naproxen sodium].  MEDICATIONS:  Current Outpatient Medications  Medication Sig Dispense Refill   calcium-vitamin D (OSCAL WITH D) 500-5 MG-MCG tablet Take 2 tablets by mouth daily. 90 tablet 1   darolutamide (NUBEQA) 300 MG tablet Take 1 tablet (300 mg total) by  mouth 2 (two) times daily with a meal. 60 tablet 2   No current facility-administered medications for this visit.     PHYSICAL EXAMINATION: ECOG PERFORMANCE STATUS: 1 - Symptomatic but completely ambulatory Vitals:   01/01/22 1104  BP: (!) 148/76  Pulse: 65  Temp: (!) 96.2 F (35.7 C)   Filed Weights   01/01/22 1104  Weight: 138 lb 8 oz (62.8 kg)     Physical Exam Constitutional:      General: He is not in acute distress.    Comments: Patient walks independently.  HENT:     Head: Normocephalic and atraumatic.  Eyes:     General: No scleral icterus.    Conjunctiva/sclera: Conjunctivae normal.     Pupils: Pupils are equal, round, and reactive to light.  Cardiovascular:     Rate and Rhythm: Normal rate and regular  rhythm.     Heart sounds: Normal heart sounds.  Pulmonary:     Effort: Pulmonary effort is normal. No respiratory distress.     Breath sounds: No wheezing or rales.     Comments: Decreased breath sound bilaterally Chest:     Chest wall: No tenderness.  Abdominal:     General: Bowel sounds are normal. There is no distension.     Palpations: Abdomen is soft. There is no mass.     Tenderness: There is no abdominal tenderness.  Musculoskeletal:        General: No deformity. Normal range of motion.     Cervical back: Normal range of motion and neck supple.  Lymphadenopathy:     Cervical: No cervical adenopathy.  Skin:    General: Skin is warm and dry.     Findings: No erythema or rash.  Neurological:     Mental Status: He is alert and oriented to person, place, and time. Mental status is at baseline.     Cranial Nerves: No cranial nerve deficit.     Coordination: Coordination normal.  Psychiatric:        Mood and Affect: Mood normal.     LABORATORY DATA:  I have reviewed the data as listed Lab Results  Component Value Date   WBC 3.4 (L) 01/01/2022   HGB 12.8 (L) 01/01/2022   HCT 39.9 01/01/2022   MCV 87.5 01/01/2022   PLT 150 01/01/2022    Recent Labs    11/23/21 0819 12/04/21 1112 01/01/22 1050  NA 139 140 138  K 4.1 4.0 4.3  CL 101 103 101  CO2 30 29 32  GLUCOSE 173* 106* 112*  BUN 14 16 21   CREATININE 0.90 0.82 0.84  CALCIUM 8.6* 8.6* 9.2  GFRNONAA >60 >60 >60  PROT 6.9 7.3 7.2  ALBUMIN 3.9 4.0 4.3  AST 16 16 18   ALT 8 7 8   ALKPHOS 71 74 66  BILITOT 0.5 0.5 0.6    RADIOGRAPHIC STUDIES: I have personally reviewed the radiological images as listed and agreed with the findings in the report. NM PET (PSMA) SKULL TO MID THIGH  Result Date: 10/07/2021 CLINICAL DATA:  History of prostate neoplasm diagnosed in 2009. Initially treated with IMRT, subsequent salvage therapy in 2016 with seed implantation. Lowest PSA 0.06 with most recent elevated PSA of 9.64. EXAM: NUCLEAR MEDICINE PET SKULL BASE TO THIGH TECHNIQUE: 9.2 mCi F18 Piflufolastat (Pylarify) was injected intravenously. Full-ring PET imaging was performed from the skull base to thigh after the radiotracer. CT data was obtained and used for attenuation correction and anatomic localization. COMPARISON:  Chest abdomen pelvis CT from 2019. FINDINGS: NECK No radiotracer activity in neck lymph nodes. Incidental CT finding: None CHEST No radiotracer accumulation within mediastinal or hilar lymph nodes. No suspicious pulmonary nodules on the CT scan. Incidental CT finding: Calcified atheromatous plaque in the thoracic aorta. Three-vessel coronary artery disease. Normal heart size. No substantial pericardial effusion. Normal caliber of central pulmonary vessels. Limited assessment of cardiovascular structures given lack of intravenous contrast. Signs of pulmonary emphysema that is worse towards the lung apices. Airways are patent. Biapical scarring is noted. ABDOMEN/PELVIS Prostate: Marked radiotracer accumulation in the prostate gland/bed. Maximum SUV of 21.5 encompassing the entire region of the prostate area measuring approximately 2.1 x 2.3 cm (image 254/3). Radiotracer  tracks towards the base of the urinary bladder which show some distortion. Lymph nodes: Tiny LEFT pelvic sidewall lymph node (image 106/6 and image 224/3 with a maximum SUV  of 2.37. This measures approximately 3-4 mm. Liver: No evidence of liver metastasis Incidental CT finding: No acute findings relative to liver, gallbladder, spleen, pancreas, adrenal glands or kidneys. Gastrointestinal tract is unremarkable aside from colonic diverticulosis. Aortic atherosclerosis without aneurysm. No adenopathy by size criteria in the retroperitoneum or in the pelvis. Brachytherapy seeds in the prostate gland. SKELETON No radiotracer accumulation to suggest skeletal metastasis. IMPRESSION: Marked radiotracer accumulation with elevated SUV in the prostate/prostate bed tracking towards the bladder base with distortion of the urinary bladder. Signs of brachytherapy in the prostate as well. No definite signs of extraprostatic disease at this time. Single lymph node in the LEFT pelvis with only minimally elevated SUV is equivocal, attention on follow-up. Aortic Atherosclerosis (ICD10-I70.0). Electronically Signed   By: Zetta Bills M.D.   On: 10/07/2021 09:16     ASSESSMENT & PLAN:  1. Malignant neoplasm of prostate ([place])   2. Encounter for antineoplastic chemotherapy   3. Other fatigue    # Prostate Cancer with biochemical recurrence, asymptomatic, castration resistant nonmetastatic.  Labs reviewed and discussed with patient PSA is trending down.  Today's level is pending. Has significant fatigue myalgia treatments. Given his age, only caregiver of his wife who has dementia, I recommend to decrease dose to 300 mg twice daily.  Prescription was sent to pharmacy  ADT with Eligard 30mg  Q58months, due in Feb 2023.   # COPD/Emphysema, recommend smoke cessation.  He needs to establish care with pulmonology # Hypocalcemia, recommend calcium supplementation.  Improved.  #Neutropenia, likely chemotherapy-induced.  May be  contributing to his fatigue.  Dose reduction.   Follow up  Lab MD 4 weeks.  Lab MD in regard  Orders Placed This Encounter  Procedures   CBC with Differential/Platelet    Standing Status:   Future    Standing Expiration Date:   01/01/2023   Comprehensive metabolic panel    Standing Status:   Future    Standing Expiration Date:   01/01/2023   PSA    Standing Status:   Future    Standing Expiration Date:   01/01/2023    Sammy Server, MD, PhD 01/01/2022

## 2022-01-07 ENCOUNTER — Other Ambulatory Visit: Payer: Self-pay | Admitting: Pharmacist

## 2022-01-07 ENCOUNTER — Encounter: Payer: Self-pay | Admitting: Oncology

## 2022-01-07 ENCOUNTER — Other Ambulatory Visit (HOSPITAL_COMMUNITY): Payer: Self-pay

## 2022-01-07 DIAGNOSIS — C61 Malignant neoplasm of prostate: Secondary | ICD-10-CM

## 2022-01-07 MED ORDER — DAROLUTAMIDE 300 MG PO TABS
300.0000 mg | ORAL_TABLET | Freq: Two times a day (BID) | ORAL | 2 refills | Status: DC
  Filled 2022-01-07: qty 120, 60d supply, fill #0
  Filled 2022-01-07: qty 60, 30d supply, fill #0
  Filled 2022-01-29: qty 60, 30d supply, fill #1
  Filled 2022-02-24: qty 60, 30d supply, fill #2

## 2022-01-08 ENCOUNTER — Encounter: Payer: Self-pay | Admitting: Oncology

## 2022-01-08 ENCOUNTER — Other Ambulatory Visit: Payer: PPO

## 2022-01-08 ENCOUNTER — Other Ambulatory Visit (HOSPITAL_COMMUNITY): Payer: Self-pay

## 2022-01-08 ENCOUNTER — Other Ambulatory Visit: Payer: Self-pay

## 2022-01-08 VITALS — BP 124/86 | HR 74 | Temp 97.9°F

## 2022-01-08 DIAGNOSIS — Z515 Encounter for palliative care: Secondary | ICD-10-CM

## 2022-01-08 NOTE — Progress Notes (Signed)
COMMUNITY PALLIATIVE CARE SW NOTE  PATIENT NAME: Sean Wood. DOB: Nov 15, 1936 MRN: [id number]  PRIMARY CARE PROVIDER: Margo Common, [name] (Inactive)  RESPONSIBLE PARTY:  Acct ID - Guarantor Home Phone Work Phone Relationship Acct Type  [id number] ELISANDRO, [id number]  Self P/F     [place], [place], [place] [phone]     PLAN OF CARE and INTERVENTIONS:             GOALS OF CARE/ ADVANCE CARE PLANNING:  Goals include to maximize quality of life and symptom management. Our advance care planning conversation included a discussion about:    The value and importance of advance care planning  Review and updating or creation of an advance directive document.  Patient is currently a DNR with completed MOST form. DNR form to be provided for in the home.  2. Palliative care encounter: Palliative medicine team will continue to support patient, patient's family, and medical team. Visit consisted of counseling and education dealing with the complex and emotionally intense issues of symptom management and palliative care in the setting of serious and potentially life-threatening illness. SW and RN met with patient and spouse in his home. Patient resides alone in a one story home. Daughter, Sean Wood, present during visit as well.  Functional changes/updates: Patient is a 86 year old male with prostate cancer and is currently undergoing PO chemo treatment under Dr. Darl Householder. Patient denies pain on this visit. Patient denies any adverse reactions to chemo pill. Patient shares that is always tired/fatigues. Patient reports a fall a month ago going down back door steps. Patient is ambulatory without A/D. Patient has not had any functional declines since [facility] NP previous in home visit.  Home & Environment assessment: Patient feels safe in her home environment. Patients home is one level. Patient has 4-5 steps to front and back door, with handrails on each side. Patients pathways inside of  home are clear and accessible. Patient is able to maneuver around his home without issue.   Protein calorie malnutrition/weight loss/Appetite: Patient share that his appetite is good.  Patient denies concerns with sleeping, however he still feels tired/fatigued in the mornings after waking up.  Psychosocial assessment: completed. Patient is the primary caregiver for his spouse, who suffers from advancing Alzheimer's. Currently patients children have hired in home assistance (a friend of family) to provide assistance and supervision in the home with patients spouse while patient is out to doctors appointments or running errands. Children also have hired someone to come to the house to wash and cut patient and spouse hair as well as do pedicures. Daughter share that paying for the caregiver can/has caused a financial hardship. Patient does not have or qualify for assistance benefits such as Medicaid or a LTC insurance policy. However, patient is a English as a second language teacher and shared that he had received medical care from [facility] some years ago but has not been back. SW provided patients daughter with the local VA specialist officer in [place] to outreach about VA benefits for patient that may be available to assist with in home care. Although, patient still drives, children assist with grocery shopping and providing meals.  Ongoing support/resources will continued to be offered if needed. Patient still drives.  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  Medical assessment: RN reviewed medications and took vitals. RN and SW provided education around potential life threatening illness and symptom management. Patient is managing his medical  conditions well and is compliant with all providers. Patient shared that he ran out of his chemo pill nearly 2 weeks ago, however oncology is aware and has refilled this. Delivery is expected on Mon 1/23. No other medication  conerns.  Hospice discussion: N/A  SW discussed goals, reviewed care plan, provided emotional support, used active and reflective listening in the form of reciprocity emotional response. Questions and concerns were addressed. The patient/family was encouraged to call with any additional questions and/or concerns. PC Provided general support and encouragement, no other unmet needs identified. Will continue to follow.  3.         PATIENT/CAREGIVER EDUCATION/ COPING:   Appearance: well groomed, appropriate given situation  Mental Status: Alert/oriented x3. Eye Contact: Good. Able to engage in proper eye contact  Thought Process: rational  Thought Content: not assessed  Speech: Normal rate, volume, tone  Mood: Normal and calm Affect: Congruent to endorsed mood, full ranging Insight: normal Judgement: normal  Interaction Style: Cooperative  Patient A&O x3, patient engaged in fluent conversation and answered all questions appropriately. Patient is able to make his needs known. Patient is HOH in left ear, so elevated volume is useful when communicating with patient. PHQ 9 not assessed. Patients children and other family locally and are all supportive.  4.         PERSONAL EMERGENCY PLAN:  Patient will call 9-1-1 for emergencies.   5.         COMMUNITY RESOURCES COORDINATION/ HEALTH CARE NAVIGATION:  Patient manages his care.  6.  FINANCIAL CONCERNS/NEEDS: None.   Primary Health Insurance: HTA Medicare Secondary Health Insurance: N/A Prescription Coverage: Yes, no history of difficulty obtaining or affording prescriptions reported     SOCIAL HX:  Social History   Tobacco Use   Smoking status: Former    Packs/day: 1.50    Types: Cigarettes    Quit date: 05/24/1981    Years since quitting: 40.6   Smokeless tobacco: Current    Types: Chew  Substance Use Topics   Alcohol use: Not Currently    Alcohol/week: 0.0 standard drinks    CODE STATUS: DNR  ADVANCED DIRECTIVES: Y MOST FORM  COMPLETE:  Y HOSPICE EDUCATION PROVIDED: N  PPS: Patient is INDP with all ADL's at this time. Patient is A&O x3 with average insight and judgement.   Time spent: 45 min      Corinne, [place]

## 2022-01-08 NOTE — Progress Notes (Signed)
PATIENT NAME: Sean Wood. DOB: Jun 04, 1936 MRN: [id number]  PRIMARY CARE PROVIDER: Margo Common, [name] (Inactive)  RESPONSIBLE PARTY:  Acct ID - Guarantor Home Phone Work Phone Relationship Acct Type  [id number] Sean [phone]  Self P/F     [place], [address], [place] [address]    PLAN OF CARE and INTERVENTIONS:               1.  GOALS OF CARE/ ADVANCE CARE PLANNING:  Remain home with the assistance of family.   MOST form in place that reflects DNR.  No DNR in the home.  Will notify PC NP of need.                2.  PATIENT/CAREGIVER EDUCATION:  In-Home support.               4. PERSONAL EMERGENCY PLAN:  Activate 911 for emergencies.                5.  DISEASE STATUS:  Joint visit completed with patient, wife, daughter Sean Wood and Sean Wood.   Fatigue:  Patient notes ongoing fatigue but this has worsened with the start of Nubeqa.  He notes this was decreased to 300 mg bid.  He notes a slight improvement since the decrease.   Patient is also the primary caregiver for his wife who has dementia.   Caregiver Fatigue:  Patient endorses needing assistance with caring for wife.   Daughter Sean Wood notes they are looking to hire a family member to help with care giving a couple of times a week. SW explored options with patient and daughter.   COPD:  Patient does have shortness of breath with minimal exertion.  No O2 in place at this time.   Mobility:  Patient is ambulating without assistive devices.  He reports a fall before Wood in the basement.  No injuries were reported.  Medication:  [name] has been out for 2 weeks now.  Daughter states it should arrive via mail on Monday.  Patient denies any side effects of Nubeqa other than fatigue.    HISTORY OF PRESENT ILLNESS:  Sean Wood. is a 86 y.o. year old male  with multiple medical problems including prostate cancer s/p post brachytherapy with reoccurance 09/2021 on darolutamide, COPD, arthritis,  h/o gross hematuria, h/o hiatial hernia, oh, h/o kidney stone.   Patient is being followed by Palliative Care every 4-8 weeks and PRN.  CODE STATUS: Desires DNR status-needs form in the home.  ADVANCED DIRECTIVES: No MOST FORM: Yes PPS: 60%   PHYSICAL EXAM:   VITALS: Today's Vitals   01/08/22 1311  BP: 124/86  Pulse: 74  Temp: 97.9 F (36.6 C)  SpO2: 95%  PainSc: 0-No pain    LUNGS: clear to auscultation  CARDIAC: Cor RRR}  EXTREMITIES: trace edema right pedal. SKIN: Skin color, texture, turgor normal. No rashes or lesions or normal  NEURO: positive for fatigue       Lorenza Burton, RN

## 2022-01-28 ENCOUNTER — Other Ambulatory Visit: Payer: Self-pay

## 2022-01-28 ENCOUNTER — Encounter: Payer: Self-pay | Admitting: Nurse Practitioner

## 2022-01-28 ENCOUNTER — Other Ambulatory Visit: Payer: PPO | Admitting: Nurse Practitioner

## 2022-01-28 DIAGNOSIS — Z515 Encounter for palliative care: Secondary | ICD-10-CM

## 2022-01-28 DIAGNOSIS — Z7189 Other specified counseling: Secondary | ICD-10-CM | POA: Diagnosis not present

## 2022-01-28 DIAGNOSIS — R5383 Other fatigue: Secondary | ICD-10-CM

## 2022-01-28 NOTE — Progress Notes (Signed)
[place] Consult Note Telephone: [phone]  Fax: [phone]    Date of encounter: 01/28/22 3:31 PM PATIENT NAME: Sean Wood [place] [address]   [id number] (home)  DOB: 1936/05/05 MRN: [id number] PRIMARY CARE PROVIDER:    Margo Common, [name] (Inactive),   RESPONSIBLE PARTY:    Contact Information     Name Relation Home Work [place] Spouse [phone]  [phone]   Leontine Locket   [phone]      I met face to face with patient and family in home. Palliative Care was asked to follow this patient by consultation request of  Altha Harm NP to address advance care planning and complex medical decision making. This is a follow up visit.                                  ASSESSMENT AND PLAN / RECOMMENDATIONS:  Advance Care Planning/Goals of Care: Goals include to maximize quality of life and symptom management. Patient/health care surrogate gave his/her permission to discuss. Our advance care planning conversation included a discussion about:    The value and importance of advance care planning  Experiences with loved ones who have been seriously ill or have died  Exploration of personal, cultural or spiritual beliefs that might influence medical decisions  Exploration of goals of care in the event of a sudden injury or illness  Identification of a healthcare agent  Review and updating or creation of an  advance directive document . Decision not to resuscitate or to de-escalate disease focused treatments due to poor prognosis. CODE STATUS: DNR  Symptom Management/Plan: Advance Care Planning; Discussed code status and wishes are to be DNR, goldenrod form completed, left in home, put in vynca   2. Goals of Care: Goals include to maximize quality of life and symptom management. Our advance care planning conversation included a discussion about:    The value  and importance of advance care planning  Exploration of personal, cultural or spiritual beliefs that might influence medical decisions  Exploration of goals of care in the event of a sudden injury or illness  Identification and preparation of a healthcare agent  Review and updating or creation of an advance directive document.   3. Palliative care encounter; Palliative care encounter; Palliative medicine team will continue to support patient, patient's family, and medical team. Visit consisted of counseling and education dealing with the complex and emotionally intense issues of symptom management and palliative care in the setting of serious and potentially life-threatening illness   4. Fatigue secondary to prostate cancer with ongoing treatment; discussed at length about sleep patterns, hygiene, importance of self care, resting, Family members have been rotating assisting Mr Rueter as he is primary caregiver for his debilitated wife.    5. f/u 2 month for ongoing monitoring chronic disease progression, ongoing discussions complex medical decision making  Follow up Palliative Care Visit: Palliative care will continue to follow for complex medical decision making, advance care planning, and clarification of goals. Return 8 weeks or prn.  I spent 61 minutes providing this consultation. More than 50% of the time in this consultation was spent in counseling and care coordination. PPS: 50%  Chief Complaint: Follow up palliative consult for complex medical decision making  HISTORY OF PRESENT ILLNESS:  Sean Wood. is a 86 y.o. year old male  with multiple medical problems including prostate cancer s/p post brachytherapy with reoccurance 09/2021 on darolutamide, COPD, arthritis, h/o gross hematuria, h/o hiatial hernia, h/o kidney stone. I called Sean Wood to confirm PC initial visit, covid screening negative. I visited Mr and Mrs Wood in their home. Sean Wood was ambulatory, slow with  ambulation. We talked about last Oncology visit. We talked about symptoms including pain, fatigue, ros. We talked about appetite, nutrition. We talked about medical goals. We talked about his functional abilities. Sean Wood continues to care for his wife at home. Sean Wood daughter now does the grocery shopping as well was helping in the home. Mr Wood does continue to make meals out of a can or box. We talked about nutrition. Sean Wood and I talked about code status, wishes are to be a DNR. We talked about role pc in poc. We talked about life review, social and family hx. We talked about quality of life. We talked about f/u pc visit, Sean Wood in agreement. Scheduled. Therapeutic listening, emotional support provide.d Questions answered.    History obtained from review of EMR, discussion with Sean Wood.  I reviewed available labs, medications, imaging, studies and related documents from the EMR.  Records reviewed and summarized above.   ROS 10 point systems reviewed all negative except HPI  Physical Exam: Constitutional: NAD General: frail appearing, thin, pleasant male EYES: lids intact ENMT:oral mucous membranes moist CV: S1S2, RRR Pulmonary: LCTA, no increased work of breathing, no cough, room air Abdomen: isoft and non tender MSK: ambulatory Skin: warm and dry Neuro:  + generalized weakness,  no cognitive impairment Psych: non-anxious affect, A and O x 3 Thank you for the opportunity to participate in the care of Sean Wood.  The palliative care team will continue to follow. Please call our office at [phone] if we can be of additional assistance.   Ebrima Ranta Z Lillar Bianca, NP   COVID-19 PATIENT SCREENING TOOL Asked and negative response unless otherwise noted:   Have you had symptoms of covid, tested positive or been in contact with someone with symptoms/positive test in the past 5-10 days?  NO

## 2022-01-29 ENCOUNTER — Other Ambulatory Visit (HOSPITAL_COMMUNITY): Payer: Self-pay

## 2022-02-03 ENCOUNTER — Other Ambulatory Visit (HOSPITAL_COMMUNITY): Payer: Self-pay

## 2022-02-09 ENCOUNTER — Encounter: Payer: Self-pay | Admitting: Oncology

## 2022-02-09 ENCOUNTER — Inpatient Hospital Stay (HOSPITAL_BASED_OUTPATIENT_CLINIC_OR_DEPARTMENT_OTHER): Payer: PPO | Admitting: Oncology

## 2022-02-09 ENCOUNTER — Inpatient Hospital Stay: Payer: PPO

## 2022-02-09 ENCOUNTER — Inpatient Hospital Stay: Payer: PPO | Attending: Oncology

## 2022-02-09 ENCOUNTER — Other Ambulatory Visit: Payer: Self-pay

## 2022-02-09 VITALS — BP 122/75 | HR 62 | Temp 96.0°F | Resp 18 | Wt 140.9 lb

## 2022-02-09 DIAGNOSIS — J438 Other emphysema: Secondary | ICD-10-CM | POA: Diagnosis not present

## 2022-02-09 DIAGNOSIS — C61 Malignant neoplasm of prostate: Secondary | ICD-10-CM

## 2022-02-09 DIAGNOSIS — Z5111 Encounter for antineoplastic chemotherapy: Secondary | ICD-10-CM | POA: Diagnosis not present

## 2022-02-09 DIAGNOSIS — R5383 Other fatigue: Secondary | ICD-10-CM | POA: Diagnosis not present

## 2022-02-09 DIAGNOSIS — Z79899 Other long term (current) drug therapy: Secondary | ICD-10-CM | POA: Insufficient documentation

## 2022-02-09 LAB — CBC WITH DIFFERENTIAL/PLATELET
Abs Immature Granulocytes: 0.03 10*3/uL (ref 0.00–0.07)
Basophils Absolute: 0 10*3/uL (ref 0.0–0.1)
Basophils Relative: 0 %
Eosinophils Absolute: 0 10*3/uL (ref 0.0–0.5)
Eosinophils Relative: 0 %
HCT: 40.1 % (ref 39.0–52.0)
Hemoglobin: 12.9 g/dL — ABNORMAL LOW (ref 13.0–17.0)
Immature Granulocytes: 1 %
Lymphocytes Relative: 46 %
Lymphs Abs: 1.9 10*3/uL (ref 0.7–4.0)
MCH: 28.4 pg (ref 26.0–34.0)
MCHC: 32.2 g/dL (ref 30.0–36.0)
MCV: 88.1 fL (ref 80.0–100.0)
Monocytes Absolute: 0.7 10*3/uL (ref 0.1–1.0)
Monocytes Relative: 16 %
Neutro Abs: 1.6 10*3/uL — ABNORMAL LOW (ref 1.7–7.7)
Neutrophils Relative %: 37 %
Platelets: 164 10*3/uL (ref 150–400)
RBC: 4.55 MIL/uL (ref 4.22–5.81)
RDW: 14 % (ref 11.5–15.5)
WBC: 4.2 10*3/uL (ref 4.0–10.5)
nRBC: 0 % (ref 0.0–0.2)

## 2022-02-09 LAB — COMPREHENSIVE METABOLIC PANEL
ALT: 9 U/L (ref 0–44)
AST: 17 U/L (ref 15–41)
Albumin: 4.2 g/dL (ref 3.5–5.0)
Alkaline Phosphatase: 59 U/L (ref 38–126)
Anion gap: 7 (ref 5–15)
BUN: 19 mg/dL (ref 8–23)
CO2: 31 mmol/L (ref 22–32)
Calcium: 9 mg/dL (ref 8.9–10.3)
Chloride: 97 mmol/L — ABNORMAL LOW (ref 98–111)
Creatinine, Ser: 0.79 mg/dL (ref 0.61–1.24)
GFR, Estimated: >60 mL/min (ref >60)
Glucose, Bld: 102 mg/dL — ABNORMAL HIGH (ref 70–99)
Potassium: 4 mmol/L (ref 3.5–5.1)
Sodium: 135 mmol/L (ref 135–145)
Total Bilirubin: 0.5 mg/dL (ref 0.3–1.2)
Total Protein: 7.1 g/dL (ref 6.5–8.1)

## 2022-02-09 LAB — PSA: Prostatic Specific Antigen: 1.19 ng/mL (ref 0.00–4.00)

## 2022-02-09 MED ORDER — LEUPROLIDE ACETATE (4 MONTH) 30 MG ~~LOC~~ KIT
30.0000 mg | PACK | Freq: Once | SUBCUTANEOUS | Status: AC
  Administered 2022-02-09: 30 mg via SUBCUTANEOUS
  Filled 2022-02-09: qty 30

## 2022-02-09 NOTE — Progress Notes (Signed)
Hematology/Oncology Progress note [phone] [phone]      Patient Care Team: Chrismon, Vickki Muff, [name] (Inactive) as PCP - General (Physician Assistant) Garrel Ridgel, DPM as Consulting Physician (Podiatry) Noreene Filbert, MD as Referring Physician (Radiation Oncology) Idelle Leech, OD as Consulting Physician (Optometry) Aydden Server, MD as Consulting Physician (Oncology) REASON FOR VISIT Follow up for treatment of biochemical recurrence of prostate cancer.  HISTORY OF PRESENTING ILLNESS:  Sean Wood. is a  86 y.o.  male with PMH listed below who was referred to me for evaluation of prostate cancer.   Cancer history dated back to 2009 when He was diagnosed with intermediate risk, Gleason 3+4, T1c prostate cancer in 2009. PSA at diagnosis was 6.1. IMRT was completed in August 2009. PSA nadir was 0.5. His PSA January 2013 was 0.8 and in January 2014 was 1.3. This was repeated in May 2014 and had increased to 1.8. He elected a repeat prostate biopsy in November 2014 after his PSA in October had increased to 2.2. This showed a focus of Gleason 3+3 adenocarcinoma from the right prostate involving less than 5%. He elected surveillance after discussing options. In May 2016, he was evaluated by Dr.Chrystal and he received salvage seed implantation with I-125. He subsequently follows with Dr.Chrystal for Lupron every 4 months since then. Achieve PSA nadir of 0.06 in 09/2016, and started to trend up and most recently trended up to 9.64.   # Lupron 30 mg on 05/10/2018, 06/14/2018 testosterone level was at 6  # Leupron 22.5mg  11/06/2018.  Delayed as patient reports feeling very fatigued and we discussed about holding Lupron.  39-month and restarted on 11/06/2018.  10/06/2021, PSMA PET scan showed marked radiotracer accumulation with elevated SUV in the prostate/prostate bed tracking towards the bladder base with distortion of the urinary bladder.  Signs of brachytherapy in the  prostate as well.  No definitive signs of extraprostatic disease.  A single lymph node in the left the pelvis with only minimally elevated SUV.  Equivocal.  Aortic atherosclerosis.  10/20/2021 Darolutamide 600mg  BID 01/01/2022, decreased to Darolutamide 300mg  BID  INTERVAL HISTORY Sean Wood. is a 86 y.o. male who has above history reviewed by me present for follow-up of management of biochemical recurrence of prostate cancer Patient has been on darolutamide 300 mg twice daily since early January 2023. Patient reports the fatigue level has improved since the decrease of dose. he has chronic shortness of breath due to chronic lung disease and not able to walk 200 feet without stop.  And he request handicapped parking card application to be signed.    Review of Systems  Constitutional:  Positive for malaise/fatigue. Negative for chills, fever and weight loss.  HENT:  Negative for sore throat.   Eyes:  Negative for redness.  Respiratory:  Positive for shortness of breath. Negative for cough and wheezing.   Cardiovascular:  Negative for chest pain, palpitations and leg swelling.  Gastrointestinal:  Negative for abdominal pain, blood in stool, nausea and vomiting.  Genitourinary:  Negative for dysuria.  Musculoskeletal:  Negative for myalgias.  Skin:  Negative for rash.  Neurological:  Negative for dizziness, tingling and tremors.  Endo/Heme/Allergies:  Does not bruise/bleed easily.  Psychiatric/Behavioral:  Negative for hallucinations.    MEDICAL HISTORY:  Past Medical History:  Diagnosis Date   Arthritis    COPD (chronic obstructive pulmonary disease) (HCC)    Elevated PSA    Hematuria, gross    History of hiatal hernia  HOH (hard of hearing)    Kidney stone    Prostate cancer ([place])    Shortness of breath dyspnea     SURGICAL HISTORY: Past Surgical History:  Procedure Laterality Date   [place]  06/24/2015   Procedure: CYSTOSCOPY;  Surgeon:  Hollice Espy, MD;  Location: [facility] ORS;  Service: Urology;;   HERNIA REPAIR     left and right inguinal hernia repair   RADIOACTIVE SEED IMPLANT N/A 06/24/2015   Procedure: RADIOACTIVE SEED IMPLANT/BRACHYTHERAPY IMPLANT;  Surgeon: Hollice Espy, MD;  Location: [facility] ORS;  Service: Urology;  Laterality: N/A;    SOCIAL HISTORY: Social History   Socioeconomic History   Marital status: Married    Spouse name: Not on file   Number of children: 1   Years of education: Not on file   Highest education level: 8th grade  Occupational History   Occupation: retired  Tobacco Use   Smoking status: Former    Packs/day: 1.50    Types: Cigarettes    Quit date: 05/24/1981    Years since quitting: 40.7   Smokeless tobacco: Current    Types: Chew  Vaping Use   Vaping Use: Never used  Substance and Sexual Activity   Alcohol use: Not Currently    Alcohol/week: 0.0 standard drinks   Drug use: No   Sexual activity: Not on file  Other Topics Concern   Not on file  Social History Narrative   Not on file   Social Determinants of Health   Financial Resource Strain: Not on file  Food Insecurity: Not on file  Transportation Needs: Not on file  Physical Activity: Not on file  Stress: Not on file  Social Connections: Not on file  Intimate Partner Violence: Not on file    FAMILY HISTORY: Family History  Problem Relation Age of Onset   Cancer Brother    Heart disease Brother    Diabetes Brother    Emphysema Brother    Prostate cancer Brother    Diabetes Sister    Emphysema Mother    Healthy Sister    Healthy Sister    Healthy Sister    Diabetes Brother    Diabetes Brother    Diabetes Brother    Healthy Brother     ALLERGIES:  is allergic to [employer] [lovastatin], niacin and related, statins, and aleve [naproxen sodium].  MEDICATIONS:  Current Outpatient Medications  Medication Sig Dispense Refill   calcium-vitamin D (OSCAL WITH D) 500-5 MG-MCG tablet Take 2 tablets by mouth daily.  90 tablet 1   darolutamide (NUBEQA) 300 MG tablet Take 1 tablet (300 mg total) by mouth 2 (two) times daily with a meal. 60 tablet 2   No current facility-administered medications for this visit.     PHYSICAL EXAMINATION: ECOG PERFORMANCE STATUS: 1 - Symptomatic but completely ambulatory Vitals:   02/09/22 1015  BP: 122/75  Pulse: 62  Resp: 18  Temp: (!) 96 F (35.6 C)   Filed Weights   02/09/22 1015  Weight: 140 lb 14.4 oz (63.9 kg)     Physical Exam Constitutional:      General: He is not in acute distress.    Comments: Patient walks independently.  HENT:     Head: Normocephalic and atraumatic.  Eyes:     General: No scleral icterus.    Conjunctiva/sclera: Conjunctivae normal.     Pupils: Pupils are equal, round, and reactive to light.  Cardiovascular:     Rate and  Rhythm: Normal rate and regular rhythm.     Heart sounds: Normal heart sounds.  Pulmonary:     Effort: Pulmonary effort is normal. No respiratory distress.     Breath sounds: No wheezing or rales.     Comments: Decreased breath sound bilaterally Chest:     Chest wall: No tenderness.  Abdominal:     General: Bowel sounds are normal. There is no distension.     Palpations: Abdomen is soft. There is no mass.     Tenderness: There is no abdominal tenderness.  Musculoskeletal:        General: No deformity. Normal range of motion.     Cervical back: Normal range of motion and neck supple.  Lymphadenopathy:     Cervical: No cervical adenopathy.  Skin:    General: Skin is warm and dry.     Findings: No erythema or rash.  Neurological:     Mental Status: He is alert and oriented to person, place, and time. Mental status is at baseline.     Cranial Nerves: No cranial nerve deficit.     Coordination: Coordination normal.  Psychiatric:        Mood and Affect: Mood normal.     LABORATORY DATA:  I have reviewed the data as listed Lab Results  Component Value Date   WBC 4.2 02/09/2022   HGB 12.9 (L)  02/09/2022   HCT 40.1 02/09/2022   MCV 88.1 02/09/2022   PLT 164 02/09/2022   Recent Labs    12/04/21 1112 01/01/22 1050 02/09/22 1000  NA 140 138 135  K 4.0 4.3 4.0  CL 103 101 97*  CO2 29 32 31  GLUCOSE 106* 112* 102*  BUN 16 21 19   CREATININE 0.82 0.84 0.79  CALCIUM 8.6* 9.2 9.0  GFRNONAA >60 >60 >60  PROT 7.3 7.2 7.1  ALBUMIN 4.0 4.3 4.2  AST 16 18 17   ALT 7 8 9   ALKPHOS 74 66 59  BILITOT 0.5 0.6 0.5    RADIOGRAPHIC STUDIES: I have personally reviewed the radiological images as listed and agreed with the findings in the report. No results found.   ASSESSMENT & PLAN:  1. Malignant neoplasm of prostate ([place])   2. Encounter for antineoplastic chemotherapy   3. Other fatigue   4. Hypocalcemia   5. Other emphysema ([place])    # Prostate Cancer with biochemical recurrence, asymptomatic, castration resistant nonmetastatic.  Labs reviewed and discussed with patient PSA continues to trend down Continue darolutamide 300 mg twice daily.  ADT with Eligard 30mg  Q41months, proceed today.  # COPD/Emphysema, recommend smoke cessation.  Recommend patient to establish care with pulmonology Handicap parking application form was signed. # Hypocalcemia, recommend calcium supplementation.  Level Has Improved.  #Neutropenia, likely chemotherapy-induced.  ANC has improved to 1.6.  Monitor.   Follow up  Lab MD 8 weeks.  Lab MD   Orders Placed This Encounter  Procedures   CBC with Differential/Platelet    Standing Status:   Future    Standing Expiration Date:   02/09/2023   Comprehensive metabolic panel    Standing Status:   Future    Standing Expiration Date:   02/09/2023   PSA    Standing Status:   Future    Standing Expiration Date:   02/09/2023    Leyland Server, MD, PhD 02/09/2022

## 2022-02-11 ENCOUNTER — Other Ambulatory Visit (HOSPITAL_COMMUNITY): Payer: Self-pay

## 2022-02-24 ENCOUNTER — Other Ambulatory Visit (HOSPITAL_COMMUNITY): Payer: Self-pay

## 2022-02-25 ENCOUNTER — Other Ambulatory Visit (HOSPITAL_COMMUNITY): Payer: Self-pay

## 2022-03-22 ENCOUNTER — Other Ambulatory Visit (HOSPITAL_COMMUNITY): Payer: Self-pay

## 2022-03-22 ENCOUNTER — Other Ambulatory Visit: Payer: Self-pay | Admitting: Oncology

## 2022-03-22 DIAGNOSIS — C61 Malignant neoplasm of prostate: Secondary | ICD-10-CM

## 2022-03-22 NOTE — Telephone Encounter (Signed)
CBC with Differential/Platelet ?Order: [id number] ?Status: Final result    ?Visible to patient: No (inaccessible in MyChart)    ?Next appt: 04/13/2022 at 09:30 AM in Oncology ([facility])    ?Dx: Malignant neoplasm of prostate ([place])    ?0 Result Notes ?          ?Component Ref Range & Units 1 mo ago ?(02/09/22) 2 mo ago ?(01/01/22) 3 mo ago ?(12/04/21) 3 mo ago ?(11/23/21) 4 mo ago ?(11/05/21) 5 mo ago ?(10/06/21) 9 mo ago ?(06/08/21)  ?WBC 4.0 - 10.5 K/uL 4.2  3.4 Low   5.1  3.7 Low   3.4 Low   3.5 Low   4.8   ?RBC 4.22 - 5.81 MIL/uL 4.55  4.56  4.48  4.60  5.08  4.73  4.74   ?Hemoglobin 13.0 - 17.0 g/dL 12.9 Low   12.8 Low   12.5 Low   12.9 Low   14.4  13.3  13.3   ?HCT 39.0 - 52.0 % 40.1  39.9  39.5  40.6  44.2  41.9  41.0   ?MCV 80.0 - 100.0 fL 88.1  87.5  88.2  88.3  87.0  88.6  86.5   ?MCH 26.0 - 34.0 pg 28.4  28.1  27.9  28.0  28.3  28.1  28.1   ?MCHC 30.0 - 36.0 g/dL 32.2  32.1  31.6  31.8  32.6  31.7  32.4   ?RDW 11.5 - 15.5 % 14.0  14.1  13.9  13.5  13.4  13.6  13.5   ?Platelets 150 - 400 K/uL 164  150  278  171  206  162  172   ?nRBC 0.0 - 0.2 % 0.0  0.0  0.0  0.0  0.0  0.0  0.0   ?Neutrophils Relative % % 37  38  34  33  29  38  36   ?Neutro Abs 1.7 - 7.7 K/uL 1.6 Low   1.3 Low   1.7  1.2 Low   1.0 Low   1.4 Low   1.7   ?Lymphocytes Relative % 46  45  45  51  51  46  49   ?Lymphs Abs 0.7 - 4.0 K/uL 1.9  1.5  2.3  1.9  1.8  1.6  2.4   ?Monocytes Relative % '16  16  20  14  18  15  14   '$ ?Monocytes Absolute 0.1 - 1.0 K/uL 0.7  0.5  1.0  0.5  0.6  0.5  0.7   ?Eosinophils Relative % 0  0  0  1  1  0  0   ?Eosinophils Absolute 0.0 - 0.5 K/uL 0.0  0.0  0.0  0.0  0.0  0.0  0.0   ?Basophils Relative % 0  0  0  0  0  0  0   ?Basophils Absolute 0.0 - 0.1 K/uL 0.0  0.0  0.0  0.0  0.0  0.0  0.0   ?Immature Granulocytes % '1  1  1  1  1  1  1   '$ ?Abs Immature Granulocytes 0.00 - 0.07 K/uL 0.03  0.02 CM  0.05 CM  0.02 CM  0.02 CM  0.03 CM  0.05 CM   ?Comment: Performed at [facility], [address] [place],  [place] [address]  ?Resulting Agency  [place] [facility] [place] [facility] [place] [facility] [place] [facility] [place] [facility] [place] [facility]  ?  ? ?  ?  ?  Specimen Collected: 02/09/22 10:00 Last Resulted: 02/09/22 10:15  ?  ?  Lab Flowsheet   ? Order Details   ? View Encounter   ? Lab and Collection Details   ? Routing   ? Result History    ?View Encounter Conversation    ?  ?CM=Additional comments    ?  ?Result Care Coordination ? ? ?Patient Communication ? ? Add Comments   Not seen Back to Top  ?  ?  ? ?Other Results from 02/09/2022 ? ? Contains abnormal data Comprehensive metabolic panel ?Order: [id number] ?Status: Final result    ?Visible to patient: No (inaccessible in MyChart)    ?Next appt: 04/13/2022 at 09:30 AM in Oncology ([facility])    ?Dx: Malignant neoplasm of prostate ([place])    ?0 Result Notes ?          ?Component Ref Range & Units 1 mo ago ?(02/09/22) 2 mo ago ?(01/01/22) 3 mo ago ?(12/04/21) 3 mo ago ?(11/23/21) 4 mo ago ?(11/05/21) 5 mo ago ?(10/06/21) 9 mo ago ?(06/08/21)  ?Sodium 135 - 145 mmol/L 135  138  140  139  139  140  139   ?Potassium 3.5 - 5.1 mmol/L 4.0  4.3  4.0  4.1  4.1  4.5  4.0   ?Chloride 98 - 111 mmol/L 97 Low   101  103  101  98  103  99   ?CO2 22 - 32 mmol/L 31  32  29  30  32  32  32   ?Glucose, Bld 70 - 99 mg/dL 102 High   112 High  CM  106 High  CM  173 High  CM  114 High  CM  119 High  CM  103 High  CM   ?Comment: Glucose reference range applies only to samples taken after fasting for at least 8 hours.  ?BUN 8 - 23 mg/dL '19  21  16  14  19  19  21   '$ ?Creatinine, Ser 0.61 - 1.24 mg/dL 0.79  0.84  0.82  0.90  0.94  0.98  0.81   ?Calcium 8.9 - 10.3 mg/dL 9.0  9.2  8.6 Low   8.6 Low   9.4  9.1  9.1   ?Total Protein 6.5 - 8.1 g/dL 7.1  7.2  7.3  6.9  7.2  7.1  7.1   ?Albumin 3.5 - 5.0 g/dL 4.2  4.3  4.0  3.9  4.2  4.2  4.1   ?AST 15 - 41 U/L '17  18  16  16  18  18  18   '$ ?ALT 0 - 44 U/L '9  8  7  8  8  9  9   '$ ?Alkaline Phosphatase 38 - 126 U/L 59  66  74  71  74  64  59   ?Total Bilirubin 0.3 - 1.2 mg/dL 0.5  0.6   0.5  0.5  0.4  0.4  0.7   ?GFR, Estimated >60 mL/min >60  >60 CM  >60 CM  >60 CM  >60 CM  >60 CM  >60 CM   ?Comment: (NOTE)  ?Calculated using the CKD-EPI Creatinine Equation (2021)   ?Anion gap 5 - '15 7  5 '$ CM  8 CM  8 CM  9 CM  5 CM  8 CM   ?Comment: Performed at [facility], [address] [place], [place] [address]  ?Resulting Agency  [facility] [place] [facility] [place] [facility]  [place] [place] [facility] [place]  ?  ? ?  ?  ?Specimen Collected: 02/09/22 10:00 Last Resulted: 02/09/22 10:28  ?  ?  ?  ? ?

## 2022-03-23 ENCOUNTER — Encounter: Payer: Self-pay | Admitting: Oncology

## 2022-03-23 ENCOUNTER — Other Ambulatory Visit (HOSPITAL_COMMUNITY): Payer: Self-pay

## 2022-03-23 MED ORDER — NUBEQA 300 MG PO TABS
300.0000 mg | ORAL_TABLET | Freq: Two times a day (BID) | ORAL | 3 refills | Status: DC
Start: 2022-03-23 — End: 2022-07-26
  Filled 2022-03-23: qty 60, 30d supply, fill #0
  Filled 2022-04-15: qty 60, 30d supply, fill #1
  Filled 2022-05-12: qty 60, 30d supply, fill #2
  Filled 2022-06-08: qty 60, 30d supply, fill #3

## 2022-03-24 ENCOUNTER — Encounter: Payer: Self-pay | Admitting: Oncology

## 2022-03-24 ENCOUNTER — Other Ambulatory Visit (HOSPITAL_COMMUNITY): Payer: Self-pay

## 2022-04-13 ENCOUNTER — Inpatient Hospital Stay: Payer: PPO | Attending: Oncology

## 2022-04-13 ENCOUNTER — Encounter: Payer: Self-pay | Admitting: Oncology

## 2022-04-13 ENCOUNTER — Inpatient Hospital Stay (HOSPITAL_BASED_OUTPATIENT_CLINIC_OR_DEPARTMENT_OTHER): Payer: PPO | Admitting: Oncology

## 2022-04-13 VITALS — BP 152/51 | HR 65 | Temp 96.2°F | Resp 18 | Wt 143.0 lb

## 2022-04-13 DIAGNOSIS — C61 Malignant neoplasm of prostate: Secondary | ICD-10-CM

## 2022-04-13 DIAGNOSIS — R5383 Other fatigue: Secondary | ICD-10-CM | POA: Diagnosis not present

## 2022-04-13 DIAGNOSIS — J438 Other emphysema: Secondary | ICD-10-CM

## 2022-04-13 DIAGNOSIS — Z79899 Other long term (current) drug therapy: Secondary | ICD-10-CM | POA: Diagnosis not present

## 2022-04-13 DIAGNOSIS — D701 Agranulocytosis secondary to cancer chemotherapy: Secondary | ICD-10-CM | POA: Diagnosis not present

## 2022-04-13 DIAGNOSIS — Z5111 Encounter for antineoplastic chemotherapy: Secondary | ICD-10-CM

## 2022-04-13 DIAGNOSIS — R5382 Chronic fatigue, unspecified: Secondary | ICD-10-CM | POA: Diagnosis not present

## 2022-04-13 DIAGNOSIS — T451X5A Adverse effect of antineoplastic and immunosuppressive drugs, initial encounter: Secondary | ICD-10-CM | POA: Insufficient documentation

## 2022-04-13 LAB — CBC WITH DIFFERENTIAL/PLATELET
Abs Immature Granulocytes: 0.03 10*3/uL (ref 0.00–0.07)
Basophils Absolute: 0 10*3/uL (ref 0.0–0.1)
Basophils Relative: 0 %
Eosinophils Absolute: 0 10*3/uL (ref 0.0–0.5)
Eosinophils Relative: 1 %
HCT: 41.5 % (ref 39.0–52.0)
Hemoglobin: 13.3 g/dL (ref 13.0–17.0)
Immature Granulocytes: 1 %
Lymphocytes Relative: 48 %
Lymphs Abs: 1.6 10*3/uL (ref 0.7–4.0)
MCH: 28.3 pg (ref 26.0–34.0)
MCHC: 32 g/dL (ref 30.0–36.0)
MCV: 88.3 fL (ref 80.0–100.0)
Monocytes Absolute: 0.6 10*3/uL (ref 0.1–1.0)
Monocytes Relative: 18 %
Neutro Abs: 1.1 10*3/uL — ABNORMAL LOW (ref 1.7–7.7)
Neutrophils Relative %: 32 %
Platelets: 155 10*3/uL (ref 150–400)
RBC: 4.7 MIL/uL (ref 4.22–5.81)
RDW: 13.5 % (ref 11.5–15.5)
WBC: 3.3 10*3/uL — ABNORMAL LOW (ref 4.0–10.5)
nRBC: 0 % (ref 0.0–0.2)

## 2022-04-13 LAB — COMPREHENSIVE METABOLIC PANEL
ALT: 9 U/L (ref 0–44)
AST: 16 U/L (ref 15–41)
Albumin: 3.9 g/dL (ref 3.5–5.0)
Alkaline Phosphatase: 56 U/L (ref 38–126)
Anion gap: 5 (ref 5–15)
BUN: 16 mg/dL (ref 8–23)
CO2: 32 mmol/L (ref 22–32)
Calcium: 8.8 mg/dL — ABNORMAL LOW (ref 8.9–10.3)
Chloride: 102 mmol/L (ref 98–111)
Creatinine, Ser: 0.89 mg/dL (ref 0.61–1.24)
GFR, Estimated: >60 mL/min (ref >60)
Glucose, Bld: 106 mg/dL — ABNORMAL HIGH (ref 70–99)
Potassium: 3.5 mmol/L (ref 3.5–5.1)
Sodium: 139 mmol/L (ref 135–145)
Total Bilirubin: 0.3 mg/dL (ref 0.3–1.2)
Total Protein: 6.8 g/dL (ref 6.5–8.1)

## 2022-04-13 LAB — PSA: Prostatic Specific Antigen: 0.88 ng/mL (ref 0.00–4.00)

## 2022-04-13 MED ORDER — ALBUTEROL SULFATE HFA 108 (90 BASE) MCG/ACT IN AERS: 2.0000 | INHALATION_SPRAY | Freq: Four times a day (QID) | RESPIRATORY_TRACT | 2 refills | Status: DC | PRN

## 2022-04-13 MED ORDER — FLUTICASONE-SALMETEROL 250-50 MCG/ACT IN AEPB: 1.0000 | INHALATION_SPRAY | Freq: Two times a day (BID) | RESPIRATORY_TRACT | 1 refills | Status: DC

## 2022-04-13 NOTE — Progress Notes (Signed)
Patient here for follow up. No new concerns.  ?

## 2022-04-13 NOTE — Progress Notes (Signed)
?Hematology/Oncology Progress note ?[phone] [phone] ?  ? ? ? ?Patient Care Team: ?Chrismon, Vickki Muff, [name] (Inactive) as PCP - General (Physician Assistant) ?Hyatt, Max T, DPM as Veterinary surgeon) ?Noreene Filbert, MD as Referring Physician (Radiation Oncology) ?Idelle Leech, OD as Consulting Physician (Optometry) ?Elby Server, MD as Consulting Physician (Oncology) ?REASON FOR VISIT ?Follow up for treatment of biochemical recurrence of prostate cancer. ? ?HISTORY OF PRESENTING ILLNESS:  ?Sean Wood. is a  86 y.o.  male with PMH listed below who was referred to me for evaluation of prostate cancer.  ? ?Cancer history dated back to 2009 when He was diagnosed with intermediate risk, Gleason 3+4, T1c prostate cancer in 2009. PSA at diagnosis was 6.1. IMRT was completed in August 2009. PSA nadir was 0.5. His PSA January 2013 was 0.8 and in January 2014 was 1.3. This was repeated in May 2014 and had increased to 1.8. He elected a repeat prostate biopsy in November 2014 after his PSA in October had increased to 2.2. This showed a focus of Gleason 3+3 adenocarcinoma from the right prostate involving less than 5%. He elected surveillance after discussing options. In May 2016, he was evaluated by Dr.Chrystal and he received salvage seed implantation with I-125. He subsequently follows with Dr.Chrystal for Lupron every 4 months since then. Achieve PSA nadir of 0.06 in 09/2016, and started to trend up and most recently trended up to 9.64.  ? ?# Lupron 30 mg on 05/10/2018, 06/14/2018 testosterone level was at 6  ?# Leupron 22.'5mg'$  11/06/2018.  Delayed as patient reports feeling very fatigued and we discussed about holding Lupron.  21-monthand restarted on 11/06/2018. ? ?10/06/2021, PSMA PET scan showed marked radiotracer accumulation with elevated SUV in the prostate/prostate bed tracking towards the bladder base with distortion of the urinary bladder.  Signs of brachytherapy in the  prostate as well.  No definitive signs of extraprostatic disease.  A single lymph node in the left the pelvis with only minimally elevated SUV.  Equivocal.  Aortic atherosclerosis. ? ?10/20/2021 Darolutamide '600mg'$  BID ?01/01/2022, decreased to Darolutamide '300mg'$  BID ? ?INTERVAL HISTORY ?EGentry Fitz is a 86y.o. male who has above history reviewed by me present for follow-up of management of biochemical recurrence of prostate cancer ?Patient has been on darolutamide 300 mg twice daily since early January 2023. ?Patient has had chronic fatigue, says that he is dealing with it ?Wife passed away in A05/14/2023  Patient is grieving. 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  Patient has not established care with pulmonology. ? ? ?  Review of Systems  ?Constitutional:  Positive for malaise/fatigue. Negative for chills, fever and weight loss.  ?HENT:  Negative for sore throat.   ?Eyes:  Negative for redness.  ?Respiratory:  Positive for shortness of breath. Negative for cough and wheezing.   ?Cardiovascular:  Negative for chest pain, palpitations and leg swelling.  ?Gastrointestinal:  Negative for abdominal pain, blood in stool, nausea and vomiting.  ?Genitourinary:  Negative for dysuria.  ?Musculoskeletal:  Negative for myalgias.  ?Skin:  Negative for rash.  ?Neurological:  Negative for dizziness, tingling and tremors.  ?Endo/Heme/Allergies:  Does not bruise/bleed easily.  ?Psychiatric/Behavioral:  Negative for hallucinations.   ? ?MEDICAL HISTORY:  ?Past Medical History:  ?Diagnosis Date  ? Arthritis   ? COPD (chronic obstructive pulmonary disease) ([place]   ? Elevated PSA   ? Hematuria, gross   ? History of hiatal hernia   ? HOH (hard  of hearing)   ? Kidney stone   ? Prostate cancer ([place])   ? Shortness of breath dyspnea   ? ? ?SURGICAL HISTORY: ?Past Surgical History:  ?Procedure Laterality Date  ? [place]  ? CYSTOSCOPY  06/24/2015  ? Procedure: CYSTOSCOPY;  Surgeon: Hollice Espy, MD;   Location: [facility] ORS;  Service: Urology;;  ? HERNIA REPAIR    ? left and right inguinal hernia repair  ? RADIOACTIVE SEED IMPLANT N/A 06/24/2015  ? Procedure: RADIOACTIVE SEED IMPLANT/BRACHYTHERAPY IMPLANT;  Surgeon: Hollice Espy, MD;  Location: [facility] ORS;  Service: Urology;  Laterality: N/A;  ? ? ?SOCIAL HISTORY: ?Social History  ? ?Socioeconomic History  ? Marital status: Married  ?  Spouse name: Not on file  ? Number of children: 1  ? Years of education: Not on file  ? Highest education level: 8th grade  ?Occupational History  ? Occupation: retired  ?Tobacco Use  ? Smoking status: Former  ?  Packs/day: 1.50  ?  Types: Cigarettes  ?  Quit date: 05/24/1981  ?  Years since quitting: 40.9  ? Smokeless tobacco: Current  ?  Types: Chew  ?Vaping Use  ? Vaping Use: Never used  ?Substance and Sexual Activity  ? Alcohol use: Not Currently  ?  Alcohol/week: 0.0 standard drinks  ? Drug use: No  ? Sexual activity: Not on file  ?Other Topics Concern  ? Not on file  ?Social History Narrative  ? Not on file  ? ?Social Determinants of Health  ? ?Financial Resource Strain: Not on file  ?Food Insecurity: Not on file  ?Transportation Needs: Not on file  ?Physical Activity: Not on file  ?Stress: Not on file  ?Social Connections: Not on file  ?Intimate Partner Violence: Not on file  ? ? ?FAMILY HISTORY: ?Family History  ?Problem Relation Age of Onset  ? Cancer Brother   ? Heart disease Brother   ? Diabetes Brother   ? Emphysema Brother   ? Prostate cancer Brother   ? Diabetes Sister   ? Emphysema Mother   ? Healthy Sister   ? Healthy Sister   ? Healthy Sister   ? Diabetes Brother   ? Diabetes Brother   ? Diabetes Brother   ? Healthy Brother   ? ? ?ALLERGIES:  is allergic to mevacor [lovastatin], niacin and related, statins, and aleve [naproxen sodium]. ? ?MEDICATIONS:  ?Current Outpatient Medications  ?Medication Sig Dispense Refill  ? albuterol (VENTOLIN HFA) 108 (90 Base) MCG/ACT inhaler Inhale 2 puffs into the lungs every 6 (six) hours  as needed for wheezing or shortness of breath. 8 g 2  ? calcium-vitamin D (OSCAL WITH D) 500-5 MG-MCG tablet Take 2 tablets by mouth daily. 90 tablet 1  ? darolutamide (NUBEQA) 300 MG tablet Take 1 tablet (300 mg total) by mouth 2 (two) times daily with a meal. 60 tablet 3  ? fluticasone-salmeterol (ADVAIR DISKUS) 250-50 MCG/ACT AEPB Inhale 1 puff into the lungs in the morning and at bedtime. 60 each 1  ? ?No current facility-administered medications for this visit.  ? ? ? ?PHYSICAL EXAMINATION: ?ECOG PERFORMANCE STATUS: 1 - Symptomatic but completely ambulatory ?Vitals:  ? 04/13/22 0941  ?BP: (!) 152/51  ?Pulse: 65  ?Resp: 18  ?Temp: (!) 96.2 ?F (35.7 ?C)  ? ?Filed Weights  ? 04/13/22 0941  ?Weight: 143 lb (64.9 kg)  ? ? ? ?Physical Exam ?Constitutional:   ?   General: He is not in acute distress. ?  Comments: Patient walks independently.  ?HENT:  ?   Head: Normocephalic and atraumatic.  ?Eyes:  ?   General: No scleral icterus. ?   Conjunctiva/sclera: Conjunctivae normal.  ?   Pupils: Pupils are equal, round, and reactive to light.  ?Cardiovascular:  ?   Rate and Rhythm: Normal rate and regular rhythm.  ?   Heart sounds: Normal heart sounds.  ?Pulmonary:  ?   Effort: Pulmonary effort is normal. No respiratory distress.  ?   Breath sounds: No wheezing or rales.  ?   Comments: Decreased breath sound bilaterally ?Chest:  ?   Chest wall: No tenderness.  ?Abdominal:  ?   General: Bowel sounds are normal. There is no distension.  ?   Palpations: Abdomen is soft. There is no mass.  ?   Tenderness: There is no abdominal tenderness.  ?Musculoskeletal:     ?   General: No deformity. Normal range of motion.  ?   Cervical back: Normal range of motion and neck supple.  ?Lymphadenopathy:  ?   Cervical: No cervical adenopathy.  ?Skin: ?   General: Skin is warm and dry.  ?   Findings: No erythema or rash.  ?Neurological:  ?   Mental Status: He is alert and oriented to person, place, and time. Mental status is at baseline.  ?    Cranial Nerves: No cranial nerve deficit.  ?   Coordination: Coordination normal.  ?Psychiatric:  ?   Comments: Grieving  ? ? ? ?LABORATORY DATA:  ?I have reviewed the data as listed ?Lab Results  ?[place]

## 2022-04-15 ENCOUNTER — Other Ambulatory Visit (HOSPITAL_COMMUNITY): Payer: Self-pay

## 2022-04-15 ENCOUNTER — Encounter: Payer: PPO | Admitting: Nurse Practitioner

## 2022-04-15 ENCOUNTER — Telehealth: Payer: Self-pay | Admitting: Nurse Practitioner

## 2022-04-15 NOTE — Telephone Encounter (Signed)
I attempted to contact Sean Wood to confirm scheduled f/u pc visit multiple times, busy signal then 1 hour later ran without able to leave message, will need to rescheduled as missed appt.  ?

## 2022-04-19 ENCOUNTER — Other Ambulatory Visit (HOSPITAL_COMMUNITY): Payer: Self-pay

## 2022-04-20 ENCOUNTER — Other Ambulatory Visit (HOSPITAL_COMMUNITY): Payer: Self-pay

## 2022-04-21 NOTE — Progress Notes (Signed)
This encounter was created in error - please disregard.

## 2022-05-12 ENCOUNTER — Other Ambulatory Visit (HOSPITAL_COMMUNITY): Payer: Self-pay

## 2022-05-13 ENCOUNTER — Telehealth: Payer: Self-pay

## 2022-05-13 NOTE — Telephone Encounter (Signed)
1215 pm.  Phone call made to patient to schedule a home visit.  No answer and unable to leave a message.  Called cell # listed and reached daughter Johnny Bridge.  She advised patient has been doing pretty well overall.  Since the recent passing of his wife, he has been on his tractor riding the fields.  Karna Christmas will notify patient of my call and have him call me back in the morning.

## 2022-05-14 ENCOUNTER — Other Ambulatory Visit (HOSPITAL_COMMUNITY): Payer: Self-pay

## 2022-05-18 ENCOUNTER — Other Ambulatory Visit (HOSPITAL_COMMUNITY): Payer: Self-pay

## 2022-05-19 ENCOUNTER — Telehealth: Payer: Self-pay

## 2022-05-19 NOTE — Telephone Encounter (Signed)
415 pm.  Message received from patient, returning my call from last week.  Return call made.  No answer and unable to leave a message.

## 2022-06-08 ENCOUNTER — Other Ambulatory Visit (HOSPITAL_COMMUNITY): Payer: Self-pay

## 2022-06-10 ENCOUNTER — Inpatient Hospital Stay: Payer: PPO | Attending: Oncology

## 2022-06-10 ENCOUNTER — Inpatient Hospital Stay: Payer: PPO

## 2022-06-10 ENCOUNTER — Inpatient Hospital Stay (HOSPITAL_BASED_OUTPATIENT_CLINIC_OR_DEPARTMENT_OTHER): Payer: PPO | Admitting: Oncology

## 2022-06-10 ENCOUNTER — Encounter: Payer: Self-pay | Admitting: Oncology

## 2022-06-10 ENCOUNTER — Other Ambulatory Visit (HOSPITAL_COMMUNITY): Payer: Self-pay

## 2022-06-10 VITALS — BP 115/65 | HR 61 | Temp 97.0°F | Resp 18 | Wt 143.6 lb

## 2022-06-10 DIAGNOSIS — R5383 Other fatigue: Secondary | ICD-10-CM | POA: Diagnosis not present

## 2022-06-10 DIAGNOSIS — C61 Malignant neoplasm of prostate: Secondary | ICD-10-CM | POA: Diagnosis not present

## 2022-06-10 DIAGNOSIS — J438 Other emphysema: Secondary | ICD-10-CM | POA: Diagnosis not present

## 2022-06-10 DIAGNOSIS — Z5111 Encounter for antineoplastic chemotherapy: Secondary | ICD-10-CM | POA: Diagnosis not present

## 2022-06-10 LAB — CBC WITH DIFFERENTIAL/PLATELET
Abs Immature Granulocytes: 0.02 10*3/uL (ref 0.00–0.07)
Basophils Absolute: 0 10*3/uL (ref 0.0–0.1)
Basophils Relative: 0 %
Eosinophils Absolute: 0 10*3/uL (ref 0.0–0.5)
Eosinophils Relative: 1 %
HCT: 42.2 % (ref 39.0–52.0)
Hemoglobin: 13.4 g/dL (ref 13.0–17.0)
Immature Granulocytes: 1 %
Lymphocytes Relative: 39 %
Lymphs Abs: 1.7 10*3/uL (ref 0.7–4.0)
MCH: 28.4 pg (ref 26.0–34.0)
MCHC: 31.8 g/dL (ref 30.0–36.0)
MCV: 89.4 fL (ref 80.0–100.0)
Monocytes Absolute: 0.8 10*3/uL (ref 0.1–1.0)
Monocytes Relative: 18 %
Neutro Abs: 1.9 10*3/uL (ref 1.7–7.7)
Neutrophils Relative %: 41 %
Platelets: 197 10*3/uL (ref 150–400)
RBC: 4.72 MIL/uL (ref 4.22–5.81)
RDW: 13.5 % (ref 11.5–15.5)
WBC: 4.4 10*3/uL (ref 4.0–10.5)
nRBC: 0 % (ref 0.0–0.2)

## 2022-06-10 LAB — COMPREHENSIVE METABOLIC PANEL
ALT: 9 U/L (ref 0–44)
AST: 18 U/L (ref 15–41)
Albumin: 4 g/dL (ref 3.5–5.0)
Alkaline Phosphatase: 82 U/L (ref 38–126)
Anion gap: 8 (ref 5–15)
BUN: 19 mg/dL (ref 8–23)
CO2: 32 mmol/L (ref 22–32)
Calcium: 8.8 mg/dL — ABNORMAL LOW (ref 8.9–10.3)
Chloride: 101 mmol/L (ref 98–111)
Creatinine, Ser: 0.98 mg/dL (ref 0.61–1.24)
GFR, Estimated: >60 mL/min (ref >60)
Glucose, Bld: 112 mg/dL — ABNORMAL HIGH (ref 70–99)
Potassium: 3.8 mmol/L (ref 3.5–5.1)
Sodium: 141 mmol/L (ref 135–145)
Total Bilirubin: 0.4 mg/dL (ref 0.3–1.2)
Total Protein: 7.1 g/dL (ref 6.5–8.1)

## 2022-06-10 LAB — PSA: Prostatic Specific Antigen: 0.6 ng/mL (ref 0.00–4.00)

## 2022-06-10 MED ORDER — LEUPROLIDE ACETATE (4 MONTH) 30 MG ~~LOC~~ KIT
30.0000 mg | PACK | Freq: Once | SUBCUTANEOUS | Status: AC
  Administered 2022-06-10: 30 mg via SUBCUTANEOUS
  Filled 2022-06-10: qty 30

## 2022-06-10 MED ORDER — OYSTER SHELL CALCIUM/D3 500-5 MG-MCG PO TABS: 2.0000 | ORAL_TABLET | Freq: Every day | ORAL | 1 refills | Status: DC

## 2022-06-10 NOTE — Progress Notes (Signed)
Hematology/Oncology Progress note [phone] [phone]      Patient Care Team: Chrismon, Vickki Muff, [name] (Inactive) as PCP - General (Physician Assistant) Garrel Ridgel, DPM as Consulting Physician (Podiatry) Noreene Filbert, MD as Referring Physician (Radiation Oncology) Idelle Leech, OD as Consulting Physician (Optometry) Rudra Server, MD as Consulting Physician (Oncology)   ASSESSMENT & PLAN:   Malignant neoplasm of prostate [facility]) Labs reviewed and discussed with patient. Patient has had fatigue secondary to darolutamide, given at reduced dose of 300 mg twice daily. I recommend patient to take 2 weeks break and resume after that.   ADT with Eligard '30mg'$  Q20month, will give today.  Other fatigue Patient forgot to hold his medication after last visit.   We will hold chemotherapy for 2 weeks.  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HCC) OPD/Emphysema, recommend smoke cessation.  Patient is reluctant about establishing care with pulmonology.  He is not actively follow-up with primary care provider as well.  I encouraged to schedule a follow-up appointment with PCP. Recommend albuterol as needed for wheezing/shortness of breath.  Advised twice daily.   Hypocalcemia Continue calcium supplementation.  Refills were sent.  Orders Placed This Encounter  Procedures   CBC with Differential/Platelet    Standing Status:   Future    Standing Expiration Date:   06/11/2023   Comprehensive metabolic panel    Standing Status:   Future    Standing Expiration Date:   06/10/2023   PSA    Standing Status:   Future    Standing Expiration Date:   06/11/2023   Follow up  Lab MD 4 months + Eligard.   All questions were answered. The patient knows to call the clinic with any problems, questions or concerns.  ZEarlie Server MD, PhD [facility] Hematology Oncology 06/10/2022    HISTORY OF PRESENTING ILLNESS:  Sean Wood is a  86y.o.  male with PMH listed below who  was referred to me for evaluation of prostate cancer.   Cancer history dated back to 2009 when He was diagnosed with intermediate risk, Gleason 3+4, T1c prostate cancer in 2009. PSA at diagnosis was 6.1. IMRT was completed in August 2009. PSA nadir was 0.5. His PSA January 2013 was 0.8 and in January 2014 was 1.3. This was repeated in May 2014 and had increased to 1.8. He elected a repeat prostate biopsy in November 2014 after his PSA in October had increased to 2.2. This showed a focus of Gleason 3+3 adenocarcinoma from the right prostate involving less than 5%. He elected surveillance after discussing options. In May 2016, he was evaluated by Dr.Chrystal and he received salvage seed implantation with I-125. He subsequently follows with Dr.Chrystal for Lupron every 4 months since then. Achieve PSA nadir of 0.06 in 09/2016, and started to trend up and most recently trended up to 9.64.   # Lupron 30 mg on 05/10/2018, 06/14/2018 testosterone level was at 6  # Leupron 22.'5mg'$  11/06/2018.  Delayed as patient reports feeling very fatigued and we discussed about holding Lupron.  265-monthnd restarted on 11/06/2018.  10/06/2021, PSMA PET scan showed marked radiotracer accumulation with elevated SUV in the prostate/prostate bed tracking towards the bladder base with distortion of the urinary bladder.  Signs of brachytherapy in the prostate as well.  No definitive signs of extraprostatic disease.  A single lymph node in the left the pelvis with only minimally elevated SUV.  Equivocal.  Aortic atherosclerosis.  10/20/2021 Darolutamide '600mg'$  BID 01/01/2022, decreased to Darolutamide  $'300mg'E$  BID  INTERVAL HISTORY Sean Wood. is a 86 y.o. male who has above history reviewed by me present for follow-up of management of biochemical recurrence of prostate cancer Patient has been on darolutamide 300 mg twice daily since early January 2023. Patient continues to feel tired. Wife passed away in 2022-04-18.  Patient is  grieving. Chronic shortness of breath due to COPD/emphysema.  Patient has not established care with pulmonology.     Review of Systems  Constitutional:  Positive for malaise/fatigue. Negative for chills, fever and weight loss.  HENT:  Negative for sore throat.   Eyes:  Negative for redness.  Respiratory:  Positive for shortness of breath. Negative for cough and wheezing.   Cardiovascular:  Negative for chest pain, palpitations and leg swelling.  Gastrointestinal:  Negative for abdominal pain, blood in stool, nausea and vomiting.  Genitourinary:  Negative for dysuria.  Musculoskeletal:  Negative for myalgias.  Skin:  Negative for rash.  Neurological:  Negative for dizziness, tingling and tremors.  Endo/Heme/Allergies:  Does not bruise/bleed easily.  Psychiatric/Behavioral:  Negative for hallucinations.     MEDICAL HISTORY:  Past Medical History:  Diagnosis Date   Arthritis    COPD (chronic obstructive pulmonary disease) (HCC)    Elevated PSA    Hematuria, gross    History of hiatal hernia    HOH (hard of hearing)    Kidney stone    Prostate cancer ([place])    Shortness of breath dyspnea     SURGICAL HISTORY: Past Surgical History:  Procedure Laterality Date   [place]  06/24/2015   Procedure: CYSTOSCOPY;  Surgeon: Hollice Espy, MD;  Location: [facility] ORS;  Service: Urology;;   HERNIA REPAIR     left and right inguinal hernia repair   RADIOACTIVE SEED IMPLANT N/A 06/24/2015   Procedure: RADIOACTIVE SEED IMPLANT/BRACHYTHERAPY IMPLANT;  Surgeon: Hollice Espy, MD;  Location: [facility] ORS;  Service: Urology;  Laterality: N/A;    SOCIAL HISTORY: Social History   Socioeconomic History   Marital status: Married    Spouse name: Not on file   Number of children: 1   Years of education: Not on file   Highest education level: 8th grade  Occupational History   Occupation: retired  Tobacco Use   Smoking status: Former    Packs/day: 1.50    Types:  Cigarettes    Quit date: 05/24/1981    Years since quitting: 41.0   Smokeless tobacco: Current    Types: Chew  Vaping Use   Vaping Use: Never used  Substance and Sexual Activity   Alcohol use: Not Currently    Alcohol/week: 0.0 standard drinks of alcohol   Drug use: No   Sexual activity: Not on file  Other Topics Concern   Not on file  Social History Narrative   Not on file   Social Determinants of Health   Financial Resource Strain: Low Risk  (04/06/2018)   Overall Financial Resource Strain (CARDIA)    Difficulty of Paying Living Expenses: Not hard at all  Food Insecurity: No Food Insecurity (04/06/2018)   Hunger Vital Sign    Worried About Running Out of Food in the Last Year: Never true    [place] in the Last Year: Never true  Transportation Needs: No Transportation Needs (04/06/2018)   PRAPARE - Hydrologist (Medical): No    Lack of Transportation (Non-Medical): No  Physical Activity: Inactive (09/19/2019)  Exercise Vital Sign    Days of Exercise per Week: 0 days    Minutes of Exercise per Session: 0 min  Stress: No Stress Concern Present (09/19/2019)   [place]    Feeling of Stress : Not at all  Social Connections: Unknown (09/19/2019)   Social Connection and Isolation Panel [NHANES]    Frequency of Communication with Friends and Family: Patient refused    Frequency of Social Gatherings with Friends and Family: Patient refused    Attends Religious Services: Patient refused    Active Member of Clubs or Organizations: Patient refused    Attends Archivist Meetings: Patient refused    Marital Status: Patient refused  Intimate Partner Violence: Unknown (09/19/2019)   Humiliation, Afraid, Rape, and Kick questionnaire    Fear of Current or Ex-Partner: Patient refused    Emotionally Abused: Patient refused    Physically Abused: Patient refused    Sexually Abused:  Patient refused    FAMILY HISTORY: Family History  Problem Relation Age of Onset   Cancer Brother    Heart disease Brother    Diabetes Brother    Emphysema Brother    Prostate cancer Brother    Diabetes Sister    Emphysema Mother    Healthy Sister    Healthy Sister    Healthy Sister    Diabetes Brother    Diabetes Brother    Diabetes Brother    Healthy Brother     ALLERGIES:  is allergic to [employer] [lovastatin], niacin and related, statins, and aleve [naproxen sodium].  MEDICATIONS:  Current Outpatient Medications  Medication Sig Dispense Refill   albuterol (VENTOLIN HFA) 108 (90 Base) MCG/ACT inhaler Inhale 2 puffs into the lungs every 6 (six) hours as needed for wheezing or shortness of breath. 8 g 2   calcium-vitamin D (OSCAL WITH D) 500-5 MG-MCG tablet Take 2 tablets by mouth daily. 90 tablet 1   darolutamide (NUBEQA) 300 MG tablet Take 1 tablet (300 mg total) by mouth 2 (two) times daily with a meal. 60 tablet 3   fluticasone-salmeterol (ADVAIR DISKUS) 250-50 MCG/ACT AEPB Inhale 1 puff into the lungs in the morning and at bedtime. 60 each 1   No current facility-administered medications for this visit.     PHYSICAL EXAMINATION: ECOG PERFORMANCE STATUS: 1 - Symptomatic but completely ambulatory Vitals:   06/10/22 1024  BP: 115/65  Pulse: 61  Resp: 18  Temp: (!) 97 F (36.1 C)   Filed Weights   06/10/22 1024  Weight: 143 lb 9.6 oz (65.1 kg)     Physical Exam Constitutional:      General: He is not in acute distress.    Comments: Patient walks independently.  HENT:     Head: Normocephalic and atraumatic.  Eyes:     General: No scleral icterus.    Conjunctiva/sclera: Conjunctivae normal.     Pupils: Pupils are equal, round, and reactive to light.  Cardiovascular:     Rate and Rhythm: Normal rate and regular rhythm.     Heart sounds: Normal heart sounds.  Pulmonary:     Effort: Pulmonary effort is normal. No respiratory distress.     Breath sounds:  No wheezing or rales.     Comments: Decreased breath sound bilaterally Chest:     Chest wall: No tenderness.  Abdominal:     General: Bowel sounds are normal. There is no distension.     Palpations: Abdomen is soft. There is  no mass.     Tenderness: There is no abdominal tenderness.  Musculoskeletal:        General: No deformity. Normal range of motion.     Cervical back: Normal range of motion and neck supple.  Lymphadenopathy:     Cervical: No cervical adenopathy.  Skin:    General: Skin is warm and dry.     Findings: No erythema or rash.  Neurological:     Mental Status: He is alert and oriented to person, place, and time. Mental status is at baseline.     Cranial Nerves: No cranial nerve deficit.     Coordination: Coordination normal.  Psychiatric:     Comments: Grieving      LABORATORY DATA:  I have reviewed the data as listed Lab Results  Component Value Date   WBC 4.4 06/10/2022   HGB 13.4 06/10/2022   HCT 42.2 06/10/2022   MCV 89.4 06/10/2022   PLT 197 06/10/2022   Recent Labs    02/09/22 1000 04/13/22 0929 06/10/22 0954  NA 135 139 141  K 4.0 3.5 3.8  CL 97* 102 101  CO2 31 32 32  GLUCOSE 102* 106* 112*  BUN '19 16 19  '$ CREATININE 0.79 0.89 0.98  CALCIUM 9.0 8.8* 8.8*  GFRNONAA >60 >60 >60  PROT 7.1 6.8 7.1  ALBUMIN 4.2 3.9 4.0  AST '17 16 18  '$ ALT '9 9 9  '$ ALKPHOS 59 56 82  BILITOT 0.5 0.3 0.4    RADIOGRAPHIC STUDIES: I have personally reviewed the radiological images as listed and agreed with the findings in the report. No results found.

## 2022-06-10 NOTE — Assessment & Plan Note (Signed)
Patient forgot to hold his medication after last visit.   We will hold chemotherapy for 2 weeks.

## 2022-06-10 NOTE — Progress Notes (Signed)
Pt here for follow up. No new concerns voiced.   

## 2022-06-10 NOTE — Assessment & Plan Note (Signed)
OPD/Emphysema, recommend smoke cessation.  Patient is reluctant about establishing care with pulmonology.  He is not actively follow-up with primary care provider as well.  I encouraged to schedule a follow-up appointment with PCP. Recommend albuterol as needed for wheezing/shortness of breath.  Advised twice daily.

## 2022-06-10 NOTE — Assessment & Plan Note (Signed)
Continue calcium supplementation.  Refills were sent.

## 2022-06-10 NOTE — Assessment & Plan Note (Signed)
Labs reviewed and discussed with patient. Patient has had fatigue secondary to darolutamide, given at reduced dose of 300 mg twice daily. I recommend patient to take 2 weeks break and resume after that.   ADT with Eligard '30mg'$  Q71month, will give today.

## 2022-06-24 DIAGNOSIS — S92001A Unspecified fracture of right calcaneus, initial encounter for closed fracture: Secondary | ICD-10-CM | POA: Diagnosis not present

## 2022-06-24 DIAGNOSIS — S61411A Laceration without foreign body of right hand, initial encounter: Secondary | ICD-10-CM | POA: Diagnosis not present

## 2022-06-25 ENCOUNTER — Other Ambulatory Visit: Payer: Self-pay | Admitting: Physician Assistant

## 2022-06-25 ENCOUNTER — Ambulatory Visit
Admission: RE | Admit: 2022-06-25 | Discharge: 2022-06-25 | Disposition: A | Discharge status: Home / Self Care | Payer: PPO | Source: Ambulatory Visit | Attending: Physician Assistant | Admitting: Physician Assistant

## 2022-06-25 ENCOUNTER — Ambulatory Visit: Admission: RE | Admit: 2022-06-25 | Payer: PPO | Source: Ambulatory Visit

## 2022-06-25 DIAGNOSIS — S92131A Displaced fracture of posterior process of right talus, initial encounter for closed fracture: Secondary | ICD-10-CM | POA: Diagnosis not present

## 2022-06-25 DIAGNOSIS — S92001A Unspecified fracture of right calcaneus, initial encounter for closed fracture: Secondary | ICD-10-CM | POA: Insufficient documentation

## 2022-06-28 ENCOUNTER — Other Ambulatory Visit: Payer: Self-pay

## 2022-06-28 ENCOUNTER — Telehealth: Payer: Self-pay | Admitting: *Deleted

## 2022-06-28 DIAGNOSIS — C61 Malignant neoplasm of prostate: Secondary | ICD-10-CM

## 2022-06-28 NOTE — Telephone Encounter (Addendum)
Per MD, will hold off medicaiton for about 4 week to allow for recovery time. Pt's daughter aware.  [place] please schedule pt for lab/MD (cbc,cmp, psa) in 4 weeks and contact pt's daughter with appts.
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
## 2022-06-29 DIAGNOSIS — S92011A Displaced fracture of body of right calcaneus, initial encounter for closed fracture: Secondary | ICD-10-CM | POA: Diagnosis not present

## 2022-06-29 DIAGNOSIS — M79671 Pain in right foot: Secondary | ICD-10-CM | POA: Diagnosis not present

## 2022-06-30 DIAGNOSIS — T148XXA Other injury of unspecified body region, initial encounter: Secondary | ICD-10-CM | POA: Diagnosis not present

## 2022-07-05 DIAGNOSIS — T148XXA Other injury of unspecified body region, initial encounter: Secondary | ICD-10-CM | POA: Diagnosis not present

## 2022-07-09 ENCOUNTER — Encounter: Payer: PPO | Attending: Physician Assistant | Admitting: Physician Assistant

## 2022-07-09 DIAGNOSIS — S51811A Laceration without foreign body of right forearm, initial encounter: Secondary | ICD-10-CM | POA: Insufficient documentation

## 2022-07-09 DIAGNOSIS — L98492 Non-pressure chronic ulcer of skin of other sites with fat layer exposed: Secondary | ICD-10-CM | POA: Diagnosis not present

## 2022-07-09 DIAGNOSIS — X58XXXA Exposure to other specified factors, initial encounter: Secondary | ICD-10-CM | POA: Insufficient documentation

## 2022-07-09 DIAGNOSIS — J449 Chronic obstructive pulmonary disease, unspecified: Secondary | ICD-10-CM | POA: Diagnosis not present

## 2022-07-13 NOTE — Progress Notes (Signed)
FITZPATRICK, ALBERICO ([id number]) Visit Report for 07/09/2022 Allergy List Details Patient Name: AUL, MANGIERI. Date of Service: 07/09/2022 12:45 PM Medical Record Number: [id number] Patient Account Number: [id number] Date of Birth/Sex: 1936-07-22 (86 y.o. M) Treating RN: Carlene Coria Primary Care Mava Suares: [name] Other Clinician: Referring Yasmina Chico: Roland Rack Treating Bradd Merlos/Extender: Jeri Cos Weeks in Treatment: 0 Allergies Active Allergies lovastatin niacin Statins-HMG-CoA Reductase Inhibitors Aleve Allergy Notes Electronic Signature(s) Signed: 07/13/2022 3:54:37 PM By: Carlene Coria RN Entered By: Carlene Coria on 07/09/2022 13:07:27 Guimaraes, Wynell Balloon ([id number]) -------------------------------------------------------------------------------- Arrival Information Details Patient Name: [name] Date of Service: 07/09/2022 12:45 PM Medical Record Number: [id number] Patient Account Number: [id number] Date of Birth/Sex: 30-Apr-1936 (86 y.o. M) Treating RN: Carlene Coria Primary Care Jazion Atteberry: [name] Other Clinician: Referring Giulio Bertino: Roland Rack Treating Pebble Botkin/Extender: Skipper Cliche in Treatment: 0 Visit Information Patient Arrived: Charlyn Minerva Time: 13:02 Accompanied By: daughter Transfer Assistance: None Patient Identification Verified: Yes Secondary Verification Process Completed: Yes Patient Requires Transmission-Based Precautions: No Patient Has Alerts: No Electronic Signature(s) Signed: 07/13/2022 3:54:37 PM By: Carlene Coria RN Entered By: Carlene Coria on 07/09/2022 13:04:21 Lebo, Wynell Balloon ([id number]) -------------------------------------------------------------------------------- Clinic Level of Care Assessment Details Patient Name: [name] Date of Service: 07/09/2022 12:45 PM Medical Record Number: [id number] Patient Account Number: [id number] Date of Birth/Sex: 09-Apr-1936 (86 y.o. M) Treating RN: Carlene Coria Primary Care Famous Eisenhardt: [name] Other Clinician: Referring Sher Shampine: Roland Rack Treating Mostyn Varnell/Extender: Skipper Cliche in Treatment: 0 Clinic Level of Care Assessment Items TOOL 1 Quantity Score X - Use when EandM and Procedure is performed on INITIAL visit 1 0 ASSESSMENTS - Nursing Assessment / Reassessment X - General Physical Exam (combine w/ comprehensive assessment (listed just below) when performed on new 1 20 pt. evals) X- 1 25 Comprehensive Assessment (HX, ROS, Risk Assessments, Wounds Hx, etc.) ASSESSMENTS - Wound and Skin Assessment / Reassessment '[]'$  - Dermatologic / Skin Assessment (not related to wound area) 0 ASSESSMENTS - Ostomy and/or Continence Assessment and Care '[]'$  - Incontinence Assessment and Management 0 '[]'$  - 0 Ostomy Care Assessment and Management (repouching, etc.) PROCESS - Coordination of Care X - Simple Patient / Family Education for ongoing care 1 15 '[]'$  - 0 Complex (extensive) Patient / Family Education for ongoing care '[]'$  - 0 Staff obtains Programmer, systems, Records, Test Results / Process Orders '[]'$  - 0 Staff telephones HHA, Nursing Homes / Clarify orders / etc '[]'$  - 0 Routine Transfer to another Facility (non-emergent condition) '[]'$  - 0 Routine Hospital Admission (non-emergent condition) X- 1 15 New Admissions / Biomedical engineer / Ordering NPWT, Apligraf, etc. '[]'$  - 0 Emergency Hospital Admission (emergent condition) PROCESS - Special Needs '[]'$  - Pediatric / Minor Patient Management 0 '[]'$  - 0 Isolation Patient Management '[]'$  - 0 Hearing / Language / Visual special needs '[]'$  - 0 Assessment of Community assistance (transportation, D/C planning, etc.) '[]'$  - 0 Additional assistance / Altered mentation '[]'$  - 0 Support Surface(s) Assessment (bed, cushion, seat, etc.) INTERVENTIONS - Miscellaneous '[]'$  - External ear exam 0 '[]'$  - 0 Patient Transfer (multiple staff / Civil Service fast streamer / Similar devices) '[]'$  - 0 Simple Staple / Suture removal  (25 or less) '[]'$  - 0 Complex Staple / Suture removal (26 or more) '[]'$  - 0 Hypo/Hyperglycemic Management (do not check if billed separately) '[]'$  - 0 Ankle / Brachial Index (ABI) - do not check if billed separately Has the patient been seen at the hospital within the last three years: Yes Total Score: 75  Level Of Care: New/Established - Level 2 Ponti, JARED WHORLEY ([id number]) Electronic Signature(s) Signed: 07/13/2022 3:54:37 PM By: Carlene Coria RN Entered By: Carlene Coria on 07/09/2022 14:06:54 Dillon, Wynell Balloon ([id number]) -------------------------------------------------------------------------------- Encounter Discharge Information Details Patient Name: [name] Date of Service: 07/09/2022 12:45 PM Medical Record Number: [id number] Patient Account Number: [id number] Date of Birth/Sex: [birth year] (86 y.o. M) Treating RN: Carlene Coria Primary Care Roshaunda Starkey: [name] Other Clinician: Referring Xayvion Shirah: Roland Rack Treating Latreece Mochizuki/Extender: Skipper Cliche in Treatment: 0 Encounter Discharge Information Items Post Procedure Vitals Discharge Condition: Stable Temperature (F): 97.4 Ambulatory Status: Ambulatory Pulse (bpm): 80 Discharge Destination: Home Respiratory Rate (breaths/min): 18 Transportation: Private Auto Blood Pressure (mmHg): 125/67 Accompanied By: daughter Schedule Follow-up Appointment: Yes Clinical Summary of Care: Electronic Signature(s) Signed: 07/13/2022 3:54:37 PM By: Carlene Coria RN Entered By: Carlene Coria on 07/09/2022 14:09:57 Mcniel, Wynell Balloon ([id number]) -------------------------------------------------------------------------------- Lower Extremity Assessment Details Patient Name: [name] Date of Service: 07/09/2022 12:45 PM Medical Record Number: [id number] Patient Account Number: [id number] Date of Birth/Sex: 02-11-1936 (86 y.o. M) Treating RN: Carlene Coria Primary Care Thanh Pomerleau: [name] Other  Clinician: Referring Ranada Vigorito: Roland Rack Treating Marisal Swarey/Extender: Jeri Cos Weeks in Treatment: 0 Electronic Signature(s) Signed: 07/13/2022 3:54:37 PM By: Carlene Coria RN Entered By: Carlene Coria on 07/09/2022 13:13:28 Kon, Wynell Balloon ([id number]) -------------------------------------------------------------------------------- Multi Wound Chart Details Patient Name: [name] Date of Service: 07/09/2022 12:45 PM Medical Record Number: [id number] Patient Account Number: [id number] Date of Birth/Sex: 1936/07/03 (86 y.o. M) Treating RN: Carlene Coria Primary Care Carless Slatten: [name] Other Clinician: Referring Diesha Rostad: Roland Rack Treating Zamara Cozad/Extender: Skipper Cliche in Treatment: 0 Vital Signs Height(in): 69 Pulse(bpm): 80 Weight(lbs): 143 Blood Pressure(mmHg): 125/67 Body Mass Index(BMI): 21.1 Temperature(F): 97.4 Respiratory Rate(breaths/min): 18 Photos: [N/A:N/A] Wound Location: Right Forearm N/A N/A Wounding Event: Trauma N/A N/A Primary Etiology: Skin Tear N/A N/A Comorbid History: Chronic Obstructive Pulmonary N/A N/A Disease (COPD), Received Radiation Date Acquired: 06/19/2022 N/A N/A Weeks of Treatment: 0 N/A N/A Wound Status: Open N/A N/A Wound Recurrence: No N/A N/A Measurements L x W x D (cm) 1x0.5x0.1 N/A N/A Area (cm) : 0.393 N/A N/A Volume (cm) : 0.039 N/A N/A Classification: Full Thickness Without Exposed N/A N/A Support Structures Exudate Amount: Medium N/A N/A Exudate Type: Serosanguineous N/A N/A Exudate Color: red, brown N/A N/A Granulation Amount: Large (67-100%) N/A N/A Necrotic Amount: None Present (0%) N/A N/A Exposed Structures: Fat Layer (Subcutaneous Tissue): N/A N/A Yes Fascia: No Tendon: No Muscle: No Joint: No Bone: No Treatment Notes Electronic Signature(s) Signed: 07/13/2022 3:54:37 PM By: Carlene Coria RN Entered By: Carlene Coria on 07/09/2022 [place] Staron, Wynell Balloon  ([id number]) -------------------------------------------------------------------------------- [place] Details Patient Name: [name] Date of Service: 07/09/2022 12:45 PM Medical Record Number: [id number] Patient Account Number: [id number] Date of Birth/Sex: 07-Feb-1936 (86 y.o. M) Treating RN: Carlene Coria Primary Care Jonuel Butterfield: [name] Other Clinician: Referring Navil Kole: Roland Rack Treating Merit Maybee/Extender: Skipper Cliche in Treatment: 0 Active Inactive Wound/Skin Impairment Nursing Diagnoses: Knowledge deficit related to ulceration/compromised skin integrity Goals: Ulcer/skin breakdown will have a volume reduction of 30% by week 4 Date Initiated: 07/09/2022 Target Resolution Date: 08/09/2022 Goal Status: Active Ulcer/skin breakdown will have a volume reduction of 50% by week 8 Date Initiated: 07/09/2022 Target Resolution Date: 08/09/2022 Goal Status: Active Ulcer/skin breakdown will have a volume reduction of 80% by week 12 Date Initiated: 07/09/2022 Target Resolution Date: 09/09/2022 Goal Status: Active Ulcer/skin breakdown will heal within 14 weeks Date Initiated: 07/09/2022 Target  Resolution Date: 10/09/2022 Goal Status: Active Interventions: Assess patient/caregiver ability to obtain necessary supplies Assess patient/caregiver ability to perform ulcer/skin care regimen upon admission and as needed Assess ulceration(s) every visit Notes: Electronic Signature(s) Signed: 07/13/2022 3:54:37 PM By: Carlene Coria RN Entered By: Carlene Coria on 07/09/2022 14:04:36 Joss, Wynell Balloon ([id number]) -------------------------------------------------------------------------------- Pain Assessment Details Patient Name: [name] Date of Service: 07/09/2022 12:45 PM Medical Record Number: [id number] Patient Account Number: [id number] Date of Birth/Sex: Nov 10, 1936 (86 y.o. M) Treating RN: Carlene Coria Primary Care Sayyid Harewood: [name]  Other Clinician: Referring Alberto Schoch: Roland Rack Treating Dani Danis/Extender: Skipper Cliche in Treatment: 0 Active Problems Location of Pain Severity and Description of Pain Patient Has Paino No Site Locations Pain Management and Medication Current Pain Management: Electronic Signature(s) Signed: 07/13/2022 3:54:37 PM By: Carlene Coria RN Entered By: Carlene Coria on 07/09/2022 13:04:36 Neyer, Wynell Balloon ([id number]) -------------------------------------------------------------------------------- Patient/Caregiver Education Details Patient Name: [name] Date of Service: 07/09/2022 12:45 PM Medical Record Number: [id number] Patient Account Number: [id number] Date of Birth/Gender: February 18, 1936 (86 y.o. M) Treating RN: Carlene Coria Primary Care Physician: [name] Other Clinician: Referring Physician: Roland Rack Treating Physician/Extender: Skipper Cliche in Treatment: 0 Education Assessment Education Provided To: Patient Education Topics Provided Wound/Skin Impairment: Methods: Explain/Verbal Responses: State content correctly Electronic Signature(s) Signed: 07/13/2022 3:54:37 PM By: Carlene Coria RN Entered By: Carlene Coria on 07/09/2022 14:09:08 Hiott, Wynell Balloon ([id number]) -------------------------------------------------------------------------------- Wound Assessment Details Patient Name: [name] Date of Service: 07/09/2022 12:45 PM Medical Record Number: [id number] Patient Account Number: [id number] Date of Birth/Sex: 1936/09/02 (86 y.o. M) Treating RN: Carlene Coria Primary Care Sheylin Scharnhorst: [name] Other Clinician: Referring Araiyah Cumpton: Roland Rack Treating Ayianna Darnold/Extender: Skipper Cliche in Treatment: 0 Wound Status Wound Number: 1 Primary Skin Tear Etiology: Wound Location: Right Forearm Wound Status: Open Wounding Event: Trauma Comorbid Chronic Obstructive Pulmonary Disease (COPD), Date Acquired: 06/19/2022 History:  Received Radiation Weeks Of Treatment: 0 Clustered Wound: No Photos Wound Measurements Length: (cm) 1 Width: (cm) 0.5 Depth: (cm) 0.1 Area: (cm) 0.393 Volume: (cm) 0.039 % Reduction in Area: % Reduction in Volume: Tunneling: No Undermining: No Wound Description Classification: Full Thickness Without Exposed Support Structu Exudate Amount: Medium Exudate Type: Serosanguineous Exudate Color: red, brown res Foul Odor After Cleansing: No Slough/Fibrino No Wound Bed Granulation Amount: Large (67-100%) Exposed Structure Necrotic Amount: None Present (0%) Fascia Exposed: No Fat Layer (Subcutaneous Tissue) Exposed: Yes Tendon Exposed: No Muscle Exposed: No Joint Exposed: No Bone Exposed: No Treatment Notes Wound #1 (Forearm) Wound Laterality: Right Cleanser Peri-Wound Care Topical Primary Dressing BRIAR, WITHERSPOON ([id number]) Xtrasorb Classic Super Absorbent Dressing, 4x5 (in/in) Secondary Dressing Kerlix 4.5 x 4.1 (in/yd) Discharge Instruction: Apply Kerlix 4.5 x 4.1 (in/yd) as instructed Secured With Medipore Tape - 31M Medipore H Soft Cloth Surgical Tape, 2x2 (in/yd) Compression Wrap Compression Stockings Add-Ons Electronic Signature(s) Signed: 07/13/2022 3:54:37 PM By: Carlene Coria RN Entered By: Carlene Coria on 07/09/2022 14:03:16 Llorens, Wynell Balloon ([id number]) -------------------------------------------------------------------------------- Vitals Details Patient Name: [name] Date of Service: 07/09/2022 12:45 PM Medical Record Number: [id number] Patient Account Number: [id number] Date of Birth/Sex: 01-04-1936 (86 y.o. M) Treating RN: Carlene Coria Primary Care Sinaya Minogue: [name] Other Clinician: Referring Nat Lowenthal: Roland Rack Treating Thomasa Heidler/Extender: Skipper Cliche in Treatment: 0 Vital Signs Time Taken: 13:00 Temperature (F): 97.4 Height (in): 69 Pulse (bpm): 80 Source: Stated Respiratory Rate (breaths/min): 18 Weight (lbs):  143 Blood Pressure (mmHg): 125/67 Source: Stated Reference Range: 80 - 120 mg / dl Body Mass Index (  BMI): 21.1 Electronic Signature(s) Signed: 07/13/2022 3:54:37 PM By: Carlene Coria RN Entered By: Carlene Coria on 07/09/2022 13:05:21

## 2022-07-13 NOTE — Progress Notes (Signed)
JAVIAN, NUDD ([id number]) Visit Report for 07/09/2022 Chief Complaint Document Details Patient Name: Sean Wood, Sean Wood. Date of Service: 07/09/2022 12:45 PM Medical Record Number: [id number] Patient Account Number: [id number] Date of Birth/Sex: [birth year] (86 y.o. M) Treating RN: Carlene Coria Primary Care Provider: [name] Other Clinician: Referring Provider: Roland Rack Treating Provider/Extender: Skipper Cliche in Treatment: 0 Information Obtained from: Patient Chief Complaint Right forearm laceration Electronic Signature(s) Signed: 07/09/2022 1:44:44 PM By: Worthy Keeler [name] Entered By: Worthy Keeler on 07/09/2022 13:44:44 Sean Wood, Sean Wood ([id number]) -------------------------------------------------------------------------------- Debridement Details Patient Name: Sean Care L. Date of Service: 07/09/2022 12:45 PM Medical Record Number: [id number] Patient Account Number: [id number] Date of Birth/Sex: [birth year] (85 y.o. M) Treating RN: Carlene Coria Primary Care Provider: [name] Other Clinician: Referring Provider: Roland Rack Treating Provider/Extender: Skipper Cliche in Treatment: 0 Debridement Performed for Wound #1 Right Forearm Assessment: Performed By: Physician Tommie Sams., [name] Debridement Type: Debridement Level of Consciousness (Pre- Awake and Alert procedure): Pre-procedure Verification/Time Out Yes - 14:00 Taken: Start Time: 14:00 Total Area Debrided (L x W): 1 (cm) x 1 (cm) = 1 (cm) Tissue and other material Viable, Non-Viable, Subcutaneous, Skin: Dermis , Skin: Epidermis debrided: Level: Skin/Subcutaneous Tissue Debridement Description: Excisional Instrument: Curette Bleeding: Minimum Hemostasis Achieved: Pressure End Time: 14:08 Procedural Pain: 0 Post Procedural Pain: 0 Response to Treatment: Procedure was tolerated well Level of Consciousness (Post- Awake and Alert procedure): Post Debridement  Measurements of Total Wound Length: (cm) 1 Width: (cm) 0.5 Depth: (cm) 0.1 Volume: (cm) 0.039 Character of Wound/Ulcer Post Debridement: Improved Post Procedure Diagnosis Same as Pre-procedure Electronic Signature(s) Signed: 07/09/2022 4:22:23 PM By: Worthy Keeler [name] Signed: 07/13/2022 3:54:37 PM By: Carlene Coria RN Entered By: Carlene Coria on 07/09/2022 14:08:44 Sean Wood, Sean Wood ([id number]) -------------------------------------------------------------------------------- HPI Details Patient Name: Sean Care L. Date of Service: 07/09/2022 12:45 PM Medical Record Number: [id number] Patient Account Number: [id number] Date of Birth/Sex: 08-Jan-1936 (86 y.o. M) Treating RN: Carlene Coria Primary Care Provider: [name] Other Clinician: Referring Provider: Roland Rack Treating Provider/Extender: Skipper Cliche in Treatment: 0 History of Present Illness HPI Description: 07-09-2022 upon evaluation today patient appears to be doing well currently in regard to her significant skin tears that occurred over the right forearm and hand location. The hand was sutured the forearm with Steri-Strips. Nonetheless the hand is completely closed the forearm and may have some areas that are still open based on what we are seeing but it does look like it is doing much better than where things started he tells me that it was really torn up when it first happened. Patient does have a history of COPD but otherwise really no major medical problems other than a history of prostate cancer which has now resolved. Electronic Signature(s) Signed: 07/09/2022 2:17:33 PM By: Worthy Keeler [name] Entered By: Worthy Keeler on 07/09/2022 14:17:32 Sean Wood, Sean Wood ([id number]) -------------------------------------------------------------------------------- Physical Exam Details Patient Name: Sean Care L. Date of Service: 07/09/2022 12:45 PM Medical Record Number: [id number] Patient Account  Number: [id number] Date of Birth/Sex: 01-Apr-1936 (86 y.o. M) Treating RN: Carlene Coria Primary Care Provider: [name] Other Clinician: Referring Provider: Roland Rack Treating Provider/Extender: Skipper Cliche in Treatment: 0 Constitutional sitting or standing blood pressure is within target range for patient.. pulse regular and within target range for patient.Marland Kitchen respirations regular, non- labored and within target range for patient.Marland Kitchen temperature within target range for patient.. Well-nourished and well-hydrated in no acute distress. Eyes conjunctiva clear no eyelid  edema noted. pupils equal round and reactive to light and accommodation. Ears, Nose, Mouth, and Throat no gross abnormality of ear auricles or external auditory canals. normal hearing noted during conversation. mucus membranes moist. Respiratory normal breathing without difficulty. Musculoskeletal Patient unable to walk without assistance. Psychiatric this patient is able to make decisions and demonstrates good insight into disease process. Alert and Oriented x 3. pleasant and cooperative. Notes Upon inspection patient's arm on the right in the forearm region did have some necrotic tissue as well as eschar and dried blood over the surface of the wound I was able to clear this away today and once cleared this actually looks to be doing much better. Fortunately I do not see any evidence of active infection locally or systemically which is great news. No fevers, chills, nausea, vomiting, or diarrhea. Electronic Signature(s) Signed: 07/09/2022 2:18:25 PM By: Worthy Keeler [name] Entered By: Worthy Keeler on 07/09/2022 14:18:25 Sean Wood, Sean Wood ([id number]) -------------------------------------------------------------------------------- Physician Orders Details Patient Name: Sean Wood. Date of Service: 07/09/2022 12:45 PM Medical Record Number: [id number] Patient Account Number: [id number] Date of Birth/Sex:  1936/05/04 (86 y.o. M) Treating RN: Carlene Coria Primary Care Provider: [name] Other Clinician: Referring Provider: Roland Rack Treating Provider/Extender: Skipper Cliche in Treatment: 0 Verbal / Phone Orders: No Diagnosis Coding ICD-10 Coding Code Description [phone] Laceration without foreign body of right forearm, initial encounter J44.9 Chronic obstructive pulmonary disease, unspecified Follow-up Appointments o Return Appointment in 2 weeks. Bathing/ Shower/ Hygiene o May shower; gently cleanse wound with antibacterial soap, rinse and pat dry prior to dressing wounds Wound Treatment Wound #1 - Forearm Wound Laterality: Right Primary Dressing: Xeroform 5x9-HBD (in/in) 3 x Per Week/30 Days Discharge Instructions: Apply Xeroform 5x9-HBD (in/in) as directed Secondary Dressing: Kerlix 4.5 x 4.1 (in/yd) 3 x Per Week/30 Days Discharge Instructions: Apply Kerlix 4.5 x 4.1 (in/yd) as instructed Secured With: Medipore Tape - 9M Medipore H Soft Cloth Surgical Tape, 2x2 (in/yd) 3 x Per Week/30 Days Electronic Signature(s) Signed: 07/09/2022 4:22:23 PM By: Worthy Keeler [name] Signed: 07/13/2022 3:54:37 PM By: Carlene Coria RN Entered By: Carlene Coria on 07/09/2022 14:19:54 Kato, Sean Wood ([id number]) -------------------------------------------------------------------------------- Problem List Details Patient Name: Sean Care L. Date of Service: 07/09/2022 12:45 PM Medical Record Number: [id number] Patient Account Number: [id number] Date of Birth/Sex: 1936/11/23 (86 y.o. M) Treating RN: Carlene Coria Primary Care Provider: [name] Other Clinician: Referring Provider: Roland Rack Treating Provider/Extender: Skipper Cliche in Treatment: 0 Active Problems ICD-10 Encounter Code Description Active Date MDM Diagnosis [phone] Laceration without foreign body of right forearm, initial encounter 07/09/2022 No Yes J44.9 Chronic obstructive pulmonary disease,  unspecified 07/09/2022 No Yes Inactive Problems Resolved Problems Electronic Signature(s) Signed: 07/09/2022 1:44:29 PM By: Worthy Keeler [name] Entered By: Worthy Keeler on 07/09/2022 13:44:29 Sean Wood, Sean Wood ([id number]) -------------------------------------------------------------------------------- Progress Note Details Patient Name: Sean Care L. Date of Service: 07/09/2022 12:45 PM Medical Record Number: [id number] Patient Account Number: [id number] Date of Birth/Sex: May 29, 1936 (86 y.o. M) Treating RN: Carlene Coria Primary Care Provider: [name] Other Clinician: Referring Provider: Roland Rack Treating Provider/Extender: Skipper Cliche in Treatment: 0 Subjective Chief Complaint Information obtained from Patient Right forearm laceration History of Present Illness (HPI) 07-09-2022 upon evaluation today patient appears to be doing well currently in regard to her significant skin tears that occurred over the right forearm and hand location. The hand was sutured the forearm with Steri-Strips. Nonetheless the hand is completely closed the forearm and may have some  areas that are still open based on what we are seeing but it does look like it is doing much better than where things started he tells me that it was really torn up when it first happened. Patient does have a history of COPD but otherwise really no major medical problems other than a history of prostate cancer which has now resolved. Patient History Allergies lovastatin, niacin, Statins-HMG-CoA Reductase Inhibitors, Aleve Social History Former smoker, Marital Status - Widowed, Alcohol Use - Never, Drug Use - No History, Caffeine Use - Rarely. Medical History Respiratory Patient has history of Chronic Obstructive Pulmonary Disease (COPD) Oncologic Patient has history of Received Radiation - 2013 Review of Systems (ROS) Integumentary (Skin) Complains or has symptoms of  Wounds. Objective Constitutional sitting or standing blood pressure is within target range for patient.. pulse regular and within target range for patient.Marland Kitchen respirations regular, non- labored and within target range for patient.Marland Kitchen temperature within target range for patient.. Well-nourished and well-hydrated in no acute distress. Vitals Time Taken: 1:00 PM, Height: 69 in, Source: Stated, Weight: 143 lbs, Source: Stated, BMI: 21.1, Temperature: 97.4 F, Pulse: 80 bpm, Respiratory Rate: 18 breaths/min, Blood Pressure: 125/67 mmHg. Eyes conjunctiva clear no eyelid edema noted. pupils equal round and reactive to light and accommodation. Ears, Nose, Mouth, and Throat no gross abnormality of ear auricles or external auditory canals. normal hearing noted during conversation. mucus membranes moist. Respiratory normal breathing without difficulty. Musculoskeletal Patient unable to walk without assistance. Psychiatric Sean Wood, Sean Wood ([id number]) this patient is able to make decisions and demonstrates good insight into disease process. Alert and Oriented x 3. pleasant and cooperative. General Notes: Upon inspection patient's arm on the right in the forearm region did have some necrotic tissue as well as eschar and dried blood over the surface of the wound I was able to clear this away today and once cleared this actually looks to be doing much better. Fortunately I do not see any evidence of active infection locally or systemically which is great news. No fevers, chills, nausea, vomiting, or diarrhea. Integumentary (Hair, Skin) Wound #1 status is Open. Original cause of wound was Trauma. The date acquired was: 06/19/2022. The wound is located on the Right Forearm. The wound measures 1cm length x 0.5cm width x 0.1cm depth; 0.393cm^2 area and 0.039cm^3 volume. There is Fat Layer (Subcutaneous Tissue) exposed. There is no tunneling or undermining noted. There is a medium amount of serosanguineous drainage  noted. There is large (67- 100%) granulation within the wound bed. There is no necrotic tissue within the wound bed. Assessment Active Problems ICD-10 Laceration without foreign body of right forearm, initial encounter Chronic obstructive pulmonary disease, unspecified Procedures Wound #1 Pre-procedure diagnosis of Wound #1 is a Skin Tear located on the Right Forearm . There was a Excisional Skin/Subcutaneous Tissue Debridement with a total area of 1 sq cm performed by Tommie Sams., [name]. With the following instrument(s): Curette to remove Viable and Non-Viable tissue/material. Material removed includes Subcutaneous Tissue, Skin: Dermis, and Skin: Epidermis. No specimens were taken. A time out was conducted at 14:00, prior to the start of the procedure. A Minimum amount of bleeding was controlled with Pressure. The procedure was tolerated well with a pain level of 0 throughout and a pain level of 0 following the procedure. Post Debridement Measurements: 1cm length x 0.5cm width x 0.1cm depth; 0.039cm^3 volume. Character of Wound/Ulcer Post Debridement is improved. Post procedure Diagnosis Wound #1: Same as Pre-Procedure Plan Follow-up Appointments: Return Appointment in  2 weeks. Bathing/ Shower/ Hygiene: May shower; gently cleanse wound with antibacterial soap, rinse and pat dry prior to dressing wounds WOUND #1: - Forearm Wound Laterality: Right Primary Dressing: Xeroform 5x9-HBD (in/in) 3 x Per Week/30 Days Discharge Instructions: Apply Xeroform 5x9-HBD (in/in) as directed Secondary Dressing: Kerlix 4.5 x 4.1 (in/yd) 3 x Per Week/30 Days Discharge Instructions: Apply Kerlix 4.5 x 4.1 (in/yd) as instructed Secured With: Medipore Tape - 58M Medipore H Soft Cloth Surgical Tape, 2x2 (in/yd) 3 x Per Week/30 Days 1. I would recommend that we go ahead and initiate treatment with Xeroform gauze to the wound bed which I think is definitely going to do well currently. 2. I am also can  recommend that we use an ABD pad and Kerlix to secure following this. 3. I would also suggest patient continue to monitor for any signs of worsening or infection if anything changes he should let me know. We will see patient back for reevaluation in 1 week here in the clinic. If anything worsens or changes patient will contact our office for additional recommendations. Electronic Signature(s) Signed: 07/09/2022 2:21:20 PM By: Sean Wood, Sean Wood ([id number]) Entered By: Worthy Keeler on 07/09/2022 14:21:19 Sean Wood ([id number]) -------------------------------------------------------------------------------- ROS/PFSH Details Patient Name: Sean Wood. Date of Service: 07/09/2022 12:45 PM Medical Record Number: [id number] Patient Account Number: [id number] Date of Birth/Sex: 1936-01-03 (86 y.o. M) Treating RN: Carlene Coria Primary Care Provider: [name] Other Clinician: Referring Provider: Roland Rack Treating Provider/Extender: Skipper Cliche in Treatment: 0 Integumentary (Skin) Complaints and Symptoms: Positive for: Wounds Respiratory Medical History: Positive for: Chronic Obstructive Pulmonary Disease (COPD) Oncologic Medical History: Positive for: Received Radiation - 2013 Immunizations Pneumococcal Vaccine: Received Pneumococcal Vaccination: Yes Received Pneumococcal Vaccination On or After 60th Birthday: Yes Implantable Devices None Family and Social History Former smoker; Marital Status - Widowed; Alcohol Use: Never; Drug Use: No History; Caffeine Use: Rarely Electronic Signature(s) Signed: 07/09/2022 4:22:23 PM By: Worthy Keeler [name] Signed: 07/13/2022 3:54:37 PM By: Carlene Coria RN Entered By: Carlene Coria on 07/09/2022 13:11:40 Sean Wood, Sean Wood ([id number]) -------------------------------------------------------------------------------- SuperBill Details Patient Name: Sean Care L. Date of Service:  07/09/2022 Medical Record Number: [id number] Patient Account Number: [id number] Date of Birth/Sex: [birth year] (86 y.o. M) Treating RN: Carlene Coria Primary Care Provider: [name] Other Clinician: Referring Provider: Roland Rack Treating Provider/Extender: Skipper Cliche in Treatment: 0 Diagnosis Coding ICD-10 Codes Code Description [phone] Laceration without foreign body of right forearm, initial encounter J44.9 Chronic obstructive pulmonary disease, unspecified Facility Procedures CPT4 Code: [id number] Description: [phone] - WOUND CARE VISIT-LEV 2 EST PT Modifier: Quantity: 1 CPT4 Code: [id number] Description: 36629 - DEB SUBQ TISSUE 20 SQ CM/< Modifier: Quantity: 1 CPT4 Code: Description: ICD-10 Diagnosis Description U76.546T Laceration without foreign body of right forearm, initial encounter Modifier: Quantity: Physician Procedures CPT4 Code: [id number] Description: WC PHYS LEVEL 3 o NEW PT Modifier: 25 Quantity: 1 CPT4 Code: Description: ICD-10 Diagnosis Description C12.751Z Laceration without foreign body of right forearm, initial encounter J44.9 Chronic obstructive pulmonary disease, unspecified Modifier: Quantity: CPT4 Code: [id number] Description: 49675 - WC PHYS SUBQ TISS 20 SQ CM Modifier: Quantity: 1 CPT4 Code: Description: ICD-10 Diagnosis Description F16.384Y Laceration without foreign body of right forearm, initial encounter Modifier: Quantity: Electronic Signature(s) Signed: 07/09/2022 2:21:30 PM By: Worthy Keeler [name] Entered By: Worthy Keeler on 07/09/2022 14:21:30

## 2022-07-13 NOTE — Progress Notes (Signed)
Sean Wood, Sean Wood ([id number]) Visit Report for 07/09/2022 Abuse Risk Screen Details Patient Name: Sean Wood, Sean Wood. Date of Service: 07/09/2022 12:45 PM Medical Record Number: [id number] Patient Account Number: [id number] Date of Birth/Sex: 1936/09/28 (86 y.o. M) Treating RN: Carlene Coria Primary Care Wilburn Keir: [name] Other Clinician: Referring Leodis Alcocer: Roland Rack Treating Nickey Canedo/Extender: Skipper Cliche in Treatment: 0 Abuse Risk Screen Items Answer ABUSE RISK SCREEN: Has anyone close to you tried to hurt or harm you recentlyo No Do you feel uncomfortable with anyone in your familyo No Has anyone forced you do things that you didnot want to doo No Electronic Signature(s) Signed: 07/13/2022 3:54:37 PM By: Carlene Coria RN Entered By: Carlene Coria on 07/09/2022 13:11:46 Sean Wood, Sean Wood ([id number]) -------------------------------------------------------------------------------- Activities of Daily Living Details Patient Name: Sean Wood, Sean L. Date of Service: 07/09/2022 12:45 PM Medical Record Number: [id number] Patient Account Number: [id number] Date of Birth/Sex: 1936-07-03 (86 y.o. M) Treating RN: Carlene Coria Primary Care Gillie Crisci: [name] Other Clinician: Referring Linday Rhodes: Roland Rack Treating Jeferson Boozer/Extender: Skipper Cliche in Treatment: 0 Activities of Daily Living Items Answer Activities of Daily Living (Please select one for each item) Drive Automobile Completely Able Take Medications Completely Able Use Telephone Completely Able Care for Appearance Completely Able Use Toilet Completely Able Bath / Shower Completely Able Dress Self Completely Able Feed Self Completely Able Walk Completely Able Get In / Out Bed Completely Able Housework Completely Able Prepare Meals Completely Able Handle Money Completely Able Shop for Self Completely Able Electronic Signature(s) Signed: 07/13/2022 3:54:37 PM By: Carlene Coria RN Entered By: Carlene Coria on 07/09/2022 13:12:23 Sean Wood, Sean Wood ([id number]) -------------------------------------------------------------------------------- Education Screening Details Patient Name: Sean Care L. Date of Service: 07/09/2022 12:45 PM Medical Record Number: [id number] Patient Account Number: [id number] Date of Birth/Sex: 1936-12-18 (86 y.o. M) Treating RN: Carlene Coria Primary Care Aman Bonet: [name] Other Clinician: Referring Zanyiah Posten: Roland Rack Treating Noma Quijas/Extender: Skipper Cliche in Treatment: 0 Primary Learner Assessed: Patient Learning Preferences/Education Level/Primary Language Learning Preference: Explanation Highest Education Level: Grade School Preferred Language: English Cognitive Barrier Language Barrier: No Translator Needed: No Memory Deficit: No Emotional Barrier: No Cultural/Religious Beliefs Affecting Medical Care: No Physical Barrier Impaired Vision: No Impaired Hearing: No Decreased Hand dexterity: No Knowledge/Comprehension Knowledge Level: Medium Comprehension Level: Medium Ability to understand written instructions: Medium Ability to understand verbal instructions: Medium Motivation Anxiety Level: Anxious Cooperation: Cooperative Education Importance: Acknowledges Need Interest in Health Problems: Asks Questions Perception: Coherent Willingness to Engage in Self-Management High Activities: Readiness to Engage in Self-Management High Activities: Electronic Signature(s) Signed: 07/13/2022 3:54:37 PM By: Carlene Coria RN Entered By: Carlene Coria on 07/09/2022 13:12:49 Sean Wood, Sean Wood ([id number]) -------------------------------------------------------------------------------- Fall Risk Assessment Details Patient Name: Sean Mo. Date of Service: 07/09/2022 12:45 PM Medical Record Number: [id number] Patient Account Number: [id number] Date of Birth/Sex: 1936/06/02 (86 y.o. M) Treating RN: Carlene Coria Primary Care  Kyannah Climer: [name] Other Clinician: Referring Zyrus Hetland: Roland Rack Treating Temeca Somma/Extender: Skipper Cliche in Treatment: 0 Fall Risk Assessment Items Have you had 2 or more falls in the last 12 monthso 0 No Have you had any fall that resulted in injury in the last 12 monthso 0 No FALLS RISK SCREEN History of falling - immediate or within 3 months 0 No Secondary diagnosis (Do you have 2 or more medical diagnoseso) 0 No Ambulatory aid None/bed rest/wheelchair/nurse 0 No Crutches/cane/walker 0 No Furniture 0 No Intravenous therapy Access/Saline/Heparin Lock 0 No Gait/Transferring Normal/ bed rest/ wheelchair 0 No Weak (short steps with or  without shuffle, stooped but able to lift head while walking, may 0 No seek support from furniture) Impaired (short steps with shuffle, may have difficulty arising from chair, head down, impaired 0 No balance) Mental Status Oriented to own ability 0 No Electronic Signature(s) Signed: 07/13/2022 3:54:37 PM By: Carlene Coria RN Entered By: Carlene Coria on 07/09/2022 13:12:58 Sean Wood, Sean Wood ([id number]) -------------------------------------------------------------------------------- Foot Assessment Details Patient Name: Sean Care L. Date of Service: 07/09/2022 12:45 PM Medical Record Number: [id number] Patient Account Number: [id number] Date of Birth/Sex: 1936-06-08 (86 y.o. M) Treating RN: Carlene Coria Primary Care Keela Rubert: [name] Other Clinician: Referring Cheney Ewart: Roland Rack Treating Tiffine Henigan/Extender: Skipper Cliche in Treatment: 0 Foot Assessment Items Site Locations + = Sensation present, - = Sensation absent, C = Callus, U = Ulcer R = Redness, W = Warmth, M = Maceration, PU = Pre-ulcerative lesion F = Fissure, S = Swelling, D = Dryness Assessment Right: Left: Other Deformity: No No Prior Foot Ulcer: No No Prior Amputation: No No Charcot Joint: No No Ambulatory Status: Ambulatory Without  Help Gait: Steady Electronic Signature(s) Signed: 07/13/2022 3:54:37 PM By: Carlene Coria RN Entered By: Carlene Coria on 07/09/2022 13:13:19 Sean Wood, Sean Wood ([id number]) -------------------------------------------------------------------------------- Nutrition Risk Screening Details Patient Name: Sean Care L. Date of Service: 07/09/2022 12:45 PM Medical Record Number: [id number] Patient Account Number: [id number] Date of Birth/Sex: April 07, 1936 (86 y.o. M) Treating RN: Carlene Coria Primary Care Cache Bills: [name] Other Clinician: Referring Zayonna Ayuso: Roland Rack Treating Mayce Noyes/Extender: Skipper Cliche in Treatment: 0 Height (in): 69 Weight (lbs): 143 Body Mass Index (BMI): 21.1 Nutrition Risk Screening Items Score Screening NUTRITION RISK SCREEN: I have an illness or condition that made me change the kind and/or amount of food I eat 0 No I eat fewer than two meals per day 0 No I eat few fruits and vegetables, or milk products 0 No I have three or more drinks of beer, liquor or wine almost every day 0 No I have tooth or mouth problems that make it hard for me to eat 0 No I don't always have enough money to buy the food I need 0 No I eat alone most of the time 0 No I take three or more different prescribed or over-the-counter drugs a day 1 Yes Without wanting to, I have lost or gained 10 pounds in the last six months 0 No I am not always physically able to shop, cook and/or feed myself 0 No Nutrition Protocols Good Risk Protocol 0 No interventions needed Moderate Risk Protocol High Risk Proctocol Risk Level: Good Risk Score: 1 Electronic Signature(s) Signed: 07/13/2022 3:54:37 PM By: Carlene Coria RN Entered By: Carlene Coria on 07/09/2022 13:13:10

## 2022-07-23 ENCOUNTER — Encounter: Payer: PPO | Attending: Physician Assistant | Admitting: Physician Assistant

## 2022-07-23 DIAGNOSIS — Z872 Personal history of diseases of the skin and subcutaneous tissue: Secondary | ICD-10-CM | POA: Insufficient documentation

## 2022-07-23 DIAGNOSIS — J449 Chronic obstructive pulmonary disease, unspecified: Secondary | ICD-10-CM | POA: Diagnosis not present

## 2022-07-23 DIAGNOSIS — Z09 Encounter for follow-up examination after completed treatment for conditions other than malignant neoplasm: Secondary | ICD-10-CM | POA: Diagnosis not present

## 2022-07-23 DIAGNOSIS — L98498 Non-pressure chronic ulcer of skin of other sites with other specified severity: Secondary | ICD-10-CM | POA: Diagnosis not present

## 2022-07-23 NOTE — Progress Notes (Addendum)
Sean Wood ([id number]) Visit Report for 07/23/2022 Arrival Information Details Patient Name: Sean Wood, Sean Wood. Date of Service: 07/23/2022 11:15 AM Medical Record Number: [id number] Patient Account Number: [id number] Date of Birth/Sex: [birth year] (86 y.o. M) Treating RN: Cornell Barman Primary Care Kao Berkheimer: [name] Other Clinician: Massie Kluver Referring Allahna Husband: Roland Rack Treating Zanovia Rotz/Extender: Skipper Cliche in Treatment: 2 Visit Information History Since Last Visit All ordered tests and consults were completed: No Patient Arrived: Knee Scooter Added or deleted any medications: No Arrival Time: 12:01 Any new allergies or adverse reactions: No Transfer Assistance: None Had a fall or experienced change in No Patient Requires Transmission-Based Precautions: No activities of daily living that may affect Patient Has Alerts: No risk of falls: Hospitalized since last visit: No Pain Present Now: No Electronic Signature(s) Signed: 07/26/2022 4:15:07 PM By: Massie Kluver Entered By: Massie Kluver on 07/23/2022 12:04:10 Penman, Wynell Balloon ([id number]) -------------------------------------------------------------------------------- Clinic Level of Care Assessment Details Patient Name: Sean Mo. Date of Service: 07/23/2022 11:15 AM Medical Record Number: [id number] Patient Account Number: [id number] Date of Birth/Sex: 09-29-1936 (86 y.o. M) Treating RN: Cornell Barman Primary Care Yukari Flax: [name] Other Clinician: Massie Kluver Referring Jules Baty: Roland Rack Treating Emmanuell Kantz/Extender: Skipper Cliche in Treatment: 2 Clinic Level of Care Assessment Items TOOL 4 Quantity Score '[]'$  - Use when only an EandM is performed on FOLLOW-UP visit 0 ASSESSMENTS - Nursing Assessment / Reassessment X - Reassessment of Co-morbidities (includes updates in patient status) 1 10 X- 1 5 Reassessment of Adherence to Treatment Plan ASSESSMENTS - Wound and Skin  Assessment / Reassessment X - Simple Wound Assessment / Reassessment - one wound 1 5 '[]'$  - 0 Complex Wound Assessment / Reassessment - multiple wounds '[]'$  - 0 Dermatologic / Skin Assessment (not related to wound area) ASSESSMENTS - Focused Assessment '[]'$  - Circumferential Edema Measurements - multi extremities 0 '[]'$  - 0 Nutritional Assessment / Counseling / Intervention '[]'$  - 0 Lower Extremity Assessment (monofilament, tuning fork, pulses) '[]'$  - 0 Peripheral Arterial Disease Assessment (using hand held doppler) ASSESSMENTS - Ostomy and/or Continence Assessment and Care '[]'$  - Incontinence Assessment and Management 0 '[]'$  - 0 Ostomy Care Assessment and Management (repouching, etc.) PROCESS - Coordination of Care X - Simple Patient / Family Education for ongoing care 1 15 '[]'$  - 0 Complex (extensive) Patient / Family Education for ongoing care '[]'$  - 0 Staff obtains Programmer, systems, Records, Test Results / Process Orders '[]'$  - 0 Staff telephones HHA, Nursing Homes / Clarify orders / etc '[]'$  - 0 Routine Transfer to another Facility (non-emergent condition) '[]'$  - 0 Routine Hospital Admission (non-emergent condition) '[]'$  - 0 New Admissions / Biomedical engineer / Ordering NPWT, Apligraf, etc. '[]'$  - 0 Emergency Hospital Admission (emergent condition) X- 1 10 Simple Discharge Coordination '[]'$  - 0 Complex (extensive) Discharge Coordination PROCESS - Special Needs '[]'$  - Pediatric / Minor Patient Management 0 '[]'$  - 0 Isolation Patient Management '[]'$  - 0 Hearing / Language / Visual special needs '[]'$  - 0 Assessment of Community assistance (transportation, D/C planning, etc.) '[]'$  - 0 Additional assistance / Altered mentation '[]'$  - 0 Support Surface(s) Assessment (bed, cushion, seat, etc.) INTERVENTIONS - Wound Cleansing / Measurement [place], Kreg L. ([id number]) X- 1 5 Simple Wound Cleansing - one wound '[]'$  - 0 Complex Wound Cleansing - multiple wounds X- 1 5 Wound Imaging (photographs - any number  of wounds) '[]'$  - 0 Wound Tracing (instead of photographs) X- 1 5 Simple Wound Measurement - one wound '[]'$  - 0 Complex Wound  Measurement - multiple wounds INTERVENTIONS - Wound Dressings '[]'$  - Small Wound Dressing one or multiple wounds 0 X- 1 15 Medium Wound Dressing one or multiple wounds '[]'$  - 0 Large Wound Dressing one or multiple wounds '[]'$  - 0 Application of Medications - topical '[]'$  - 0 Application of Medications - injection INTERVENTIONS - Miscellaneous '[]'$  - External ear exam 0 '[]'$  - 0 Specimen Collection (cultures, biopsies, blood, body fluids, etc.) '[]'$  - 0 Specimen(s) / Culture(s) sent or taken to Lab for analysis '[]'$  - 0 Patient Transfer (multiple staff / Civil Service fast streamer / Similar devices) '[]'$  - 0 Simple Staple / Suture removal (25 or less) '[]'$  - 0 Complex Staple / Suture removal (26 or more) '[]'$  - 0 Hypo / Hyperglycemic Management (close monitor of Blood Glucose) '[]'$  - 0 Ankle / Brachial Index (ABI) - do not check if billed separately X- 1 5 Vital Signs Has the patient been seen at the hospital within the last three years: Yes Total Score: 80 Level Of Care: New/Established - Level 3 Electronic Signature(s) Signed: 07/26/2022 4:15:07 PM By: Massie Kluver Entered By: Massie Kluver on 07/23/2022 12:28:16 Demeter, Wynell Balloon ([id number]) -------------------------------------------------------------------------------- Encounter Discharge Information Details Patient Name: Sean Care L. Date of Service: 07/23/2022 11:15 AM Medical Record Number: [id number] Patient Account Number: [id number] Date of Birth/Sex: [birth year] (86 y.o. M) Treating RN: Cornell Barman Primary Care Gene Colee: [name] Other Clinician: Massie Kluver Referring Idolina Mantell: Roland Rack Treating Sherman Donaldson/Extender: Skipper Cliche in Treatment: 2 Encounter Discharge Information Items Discharge Condition: Stable Ambulatory Status: Knee Scooter Discharge Destination: Home Transportation: Private  Auto Accompanied By: daughter Schedule Follow-up Appointment: Yes Clinical Summary of Care: Electronic Signature(s) Signed: 07/26/2022 4:15:07 PM By: Massie Kluver Entered By: Massie Kluver on 07/23/2022 12:34:55 Hughett, Wynell Balloon ([id number]) -------------------------------------------------------------------------------- Lower Extremity Assessment Details Patient Name: Sean Care L. Date of Service: 07/23/2022 11:15 AM Medical Record Number: [id number] Patient Account Number: [id number] Date of Birth/Sex: Oct 17, 1936 (85 y.o. M) Treating RN: Cornell Barman Primary Care Jla Reynolds: [name] Other Clinician: Massie Kluver Referring Lashara Urey: Roland Rack Treating Mckynzie Liwanag/Extender: Skipper Cliche in Treatment: 2 Electronic Signature(s) Signed: 07/23/2022 3:39:08 PM By: Gretta Cool BSN, RN, CWS, Kim RN, BSN Signed: 07/26/2022 4:15:07 PM By: Massie Kluver Entered By: Massie Kluver on 07/23/2022 12:13:01 Siwik, Wynell Balloon ([id number]) -------------------------------------------------------------------------------- Multi Wound Chart Details Patient Name: Cobb, Opie L. Date of Service: 07/23/2022 11:15 AM Medical Record Number: [id number] Patient Account Number: [id number] Date of Birth/Sex: 1936-03-15 (85 y.o. M) Treating RN: Cornell Barman Primary Care Trinetta Alemu: [name] Other Clinician: Massie Kluver Referring Lyris Hitchman: Roland Rack Treating Khyran Riera/Extender: Skipper Cliche in Treatment: 2 Vital Signs Height(in): 69 Pulse(bpm): 50 Weight(lbs): 143 Blood Pressure(mmHg): 129/62 Body Mass Index(BMI): 21.1 Temperature(F): 98.4 Respiratory Rate(breaths/min): 16 Photos: [N/A:N/A] Wound Location: Right Forearm N/A N/A Wounding Event: Trauma N/A N/A Primary Etiology: Skin Tear N/A N/A Comorbid History: Chronic Obstructive Pulmonary N/A N/A Disease (COPD), Received Radiation Date Acquired: 06/19/2022 N/A N/A Weeks of Treatment: 2 N/A N/A Wound Status: Open N/A  N/A Wound Recurrence: No N/A N/A Measurements L x W x D (cm) 0.5x0.3x0.1 N/A N/A Area (cm) : 0.118 N/A N/A Volume (cm) : 0.012 N/A N/A % Reduction in Area: 70.00% N/A N/A % Reduction in Volume: 69.20% N/A N/A Classification: Full Thickness Without Exposed N/A N/A Support Structures Exudate Amount: Medium N/A N/A Exudate Type: Serosanguineous N/A N/A Exudate Color: red, brown N/A N/A Granulation Amount: Large (67-100%) N/A N/A Necrotic Amount: None Present (0%) N/A N/A Exposed Structures: Fat Layer (Subcutaneous Tissue): N/A  N/A Yes Fascia: No Tendon: No Muscle: No Joint: No Bone: No Treatment Notes Electronic Signature(s) Signed: 07/26/2022 4:15:07 PM By: Massie Kluver Entered By: Massie Kluver on 07/23/2022 12:13:21 Burgher, Wynell Balloon ([id number]) -------------------------------------------------------------------------------- [place] Details Patient Name: Sean Care L. Date of Service: 07/23/2022 11:15 AM Medical Record Number: [id number] Patient Account Number: [id number] Date of Birth/Sex: May 24, 1936 (85 y.o. M) Treating RN: Cornell Barman Primary Care Chanley Mcenery: [name] Other Clinician: Massie Kluver Referring Zan Triska: Roland Rack Treating Matayah Reyburn/Extender: Skipper Cliche in Treatment: 2 Active Inactive Electronic Signature(s) Signed: 09/03/2022 1:01:31 PM By: Gretta Cool, BSN, RN, CWS, Kim RN, BSN Previous Signature: 07/23/2022 3:39:08 PM Version By: Gretta Cool BSN, RN, CWS, Kim RN, BSN Previous Signature: 07/26/2022 4:15:07 PM Version By: Massie Kluver Entered By: Gretta Cool BSN, RN, CWS, Kim on 09/03/2022 13:01:31 Hazzard, Wynell Balloon ([id number]) -------------------------------------------------------------------------------- Pain Assessment Details Patient Name: Nobrega, Carlen L. Date of Service: 07/23/2022 11:15 AM Medical Record Number: [id number] Patient Account Number: [id number] Date of Birth/Sex: [birth year] (85 y.o. M) Treating RN: Cornell Barman Primary Care Hjalmar Ballengee: [name] Other Clinician: Massie Kluver Referring Amiya Escamilla: Roland Rack Treating Demico Ploch/Extender: Skipper Cliche in Treatment: 2 Active Problems Location of Pain Severity and Description of Pain Patient Has Paino No Site Locations Pain Management and Medication Current Pain Management: Electronic Signature(s) Signed: 07/23/2022 3:39:08 PM By: Gretta Cool, BSN, RN, CWS, Kim RN, BSN Signed: 07/26/2022 4:15:07 PM By: Massie Kluver Entered By: Massie Kluver on 07/23/2022 12:07:22 Masten, Wynell Balloon ([id number]) -------------------------------------------------------------------------------- Patient/Caregiver Education Details Patient Name: Sean Care L. Date of Service: 07/23/2022 11:15 AM Medical Record Number: [id number] Patient Account Number: [id number] Date of Birth/Gender: 04/06/1936 (86 y.o. M) Treating RN: Cornell Barman Primary Care Physician: [name] Other Clinician: Massie Kluver Referring Physician: Roland Rack Treating Physician/Extender: Skipper Cliche in Treatment: 2 Education Assessment Education Provided To: Patient Education Topics Provided Infection: Handouts: Infection Prevention and Management Methods: Explain/Verbal Responses: State content correctly Wound/Skin Impairment: Handouts: Other: wound healed. Please call if any issues arise Methods: Explain/Verbal Responses: State content correctly Electronic Signature(s) Signed: 07/26/2022 4:15:07 PM By: Massie Kluver Entered By: Massie Kluver on 07/23/2022 12:29:04 Hennigan, Wynell Balloon ([id number]) -------------------------------------------------------------------------------- Wound Assessment Details Patient Name: Sean Care L. Date of Service: 07/23/2022 11:15 AM Medical Record Number: [id number] Patient Account Number: [id number] Date of Birth/Sex: 09-08-1936 (85 y.o. M) Treating RN: Cornell Barman Primary Care Jacquita Mulhearn: [name] Other Clinician:  Massie Kluver Referring Kaenan Jake: Roland Rack Treating Carrington Olazabal/Extender: Skipper Cliche in Treatment: 2 Wound Status Wound Number: 1 Primary Skin Tear Etiology: Wound Location: Right Forearm Wound Status: Healed - Epithelialized Wounding Event: Trauma Comorbid Chronic Obstructive Pulmonary Disease (COPD), Date Acquired: 06/19/2022 History: Received Radiation Weeks Of Treatment: 2 Clustered Wound: No Photos Wound Measurements Length: (cm) 0 Width: (cm) 0 Depth: (cm) 0 Area: (cm) 0 Volume: (cm) 0 % Reduction in Area: 100% % Reduction in Volume: 100% Epithelialization: Large (67-100%) Wound Description Classification: Full Thickness Without Exposed Support Structures Exudate Amount: None Present Foul Odor After Cleansing: No Slough/Fibrino No Wound Bed Granulation Amount: Large (67-100%) Exposed Structure Necrotic Amount: None Present (0%) Fascia Exposed: No Fat Layer (Subcutaneous Tissue) Exposed: No Tendon Exposed: No Muscle Exposed: No Joint Exposed: No Bone Exposed: No Treatment Notes Wound #1 (Forearm) Wound Laterality: Right Cleanser Peri-Wound Care Topical Primary Dressing SEBASTYAN, SNODGRASS ([id number]) Secondary Dressing Secured With Compression Wrap Compression Stockings Add-Ons Electronic Signature(s) Signed: 07/23/2022 3:39:08 PM By: Gretta Cool, BSN, RN, CWS, Kim RN, BSN Signed: 07/26/2022 4:15:07 PM By: Massie Kluver Entered By: Clifton James,  Angie on 07/23/2022 12:26:18 DAELIN, HASTE ([id number]) -------------------------------------------------------------------------------- Vitals Details Patient Name: ARGIL, MAHL L. Date of Service: 07/23/2022 11:15 AM Medical Record Number: [id number] Patient Account Number: [id number] Date of Birth/Sex: 1936/01/03 (85 y.o. M) Treating RN: Cornell Barman Primary Care Laneya Gasaway: [name] Other Clinician: Massie Kluver Referring Liviana Mills: Roland Rack Treating Yidel Teuscher/Extender: Skipper Cliche in  Treatment: 2 Vital Signs Time Taken: 12:04 Temperature (F): 98.4 Height (in): 69 Pulse (bpm): 64 Weight (lbs): 143 Respiratory Rate (breaths/min): 16 Body Mass Index (BMI): 21.1 Blood Pressure (mmHg): 129/62 Reference Range: 80 - 120 mg / dl Electronic Signature(s) Signed: 07/26/2022 4:15:07 PM By: Massie Kluver Entered By: Massie Kluver on 07/23/2022 12:06:55

## 2022-07-23 NOTE — Progress Notes (Addendum)
DEVINE, KLINGEL ([id number]) Visit Report for 07/23/2022 Chief Complaint Document Details Patient Name: Sean Wood, Sean Wood. Date of Service: 07/23/2022 11:15 AM Medical Record Number: [id number] Patient Account Number: [id number] Date of Birth/Sex: [birth year] (85 y.o. M) Treating RN: Cornell Barman Primary Care Provider: [name] Other Clinician: Massie Kluver Referring Provider: Roland Rack Treating Provider/Extender: Skipper Cliche in Treatment: 2 Information Obtained from: Patient Chief Complaint Right forearm laceration Electronic Signature(s) Signed: 07/23/2022 11:53:49 AM By: Worthy Keeler [name] Entered By: Worthy Keeler on 07/23/2022 11:53:49 Forrey, Wynell Balloon ([id number]) -------------------------------------------------------------------------------- HPI Details Patient Name: [name] Date of Service: 07/23/2022 11:15 AM Medical Record Number: [id number] Patient Account Number: [id number] Date of Birth/Sex: August 12, 1936 (85 y.o. M) Treating RN: Cornell Barman Primary Care Provider: [name] Other Clinician: Massie Kluver Referring Provider: Roland Rack Treating Provider/Extender: Skipper Cliche in Treatment: 2 History of Present Illness HPI Description: 07-09-2022 upon evaluation today patient appears to be doing well currently in regard to her significant skin tears that occurred over the right forearm and hand location. The hand was sutured the forearm with Steri-Strips. Nonetheless the hand is completely closed the forearm and may have some areas that are still open based on what we are seeing but it does look like it is doing much better than where things started he tells me that it was really torn up when it first happened. Patient does have a history of COPD but otherwise really no major medical problems other than a history of prostate cancer which has now resolved. 07-23-2022 upon evaluation today patient appears to be doing well currently in  regard to his forearm. In fact this appears to be completely healed based on what I am seeing. I do not see any evidence of active infection locally or systemically at this time which is great news. No fevers, chills, nausea, vomiting, or diarrhea. Electronic Signature(s) Signed: 07/23/2022 3:47:40 PM By: Worthy Keeler [name] Entered By: Worthy Keeler on 07/23/2022 15:47:40 Riggle, Wynell Balloon ([id number]) -------------------------------------------------------------------------------- Physical Exam Details Patient Name: [name] Date of Service: 07/23/2022 11:15 AM Medical Record Number: [id number] Patient Account Number: [id number] Date of Birth/Sex: [birth year] (85 y.o. M) Treating RN: Cornell Barman Primary Care Provider: [name] Other Clinician: Massie Kluver Referring Provider: Roland Rack Treating Provider/Extender: Skipper Cliche in Treatment: 2 Constitutional Well-nourished and well-hydrated in no acute distress. Respiratory normal breathing without difficulty. Psychiatric this patient is able to make decisions and demonstrates good insight into disease process. Alert and Oriented x 3. pleasant and cooperative. Notes Upon inspection I see complete epithelization in regard to the forearm I do believe still protecting this with the Xeroform gauze as well as the ABD pad and stretch net is a good way to go. I discussed that with the patient and his daughter today. They are going to keep this in place that will help for protection sake and allowing this to toughen up. Electronic Signature(s) Signed: 07/23/2022 3:48:00 PM By: Worthy Keeler [name] Entered By: Worthy Keeler on 07/23/2022 15:48:00 Roesler, Wynell Balloon ([id number]) -------------------------------------------------------------------------------- Physician Orders Details Patient Name: [name] Date of Service: 07/23/2022 11:15 AM Medical Record Number: [id number] Patient Account Number:  [id number] Date of Birth/Sex: 12-28-1935 (85 y.o. M) Treating RN: Cornell Barman Primary Care Provider: [name] Other Clinician: Massie Kluver Referring Provider: Roland Rack Treating Provider/Extender: Skipper Cliche in Treatment: 2 Verbal / Phone Orders: No Diagnosis Coding ICD-10 Coding Code Description [phone] Laceration without foreign body of right  forearm, initial encounter J44.9 Chronic obstructive pulmonary disease, unspecified Discharge From [facility] Services o Discharge from [facility] Complete - wound healed. Please call if any other issues arise Additional Orders / Instructions o Other: - use yellow xeroform dressing for next 2 weeks Electronic Signature(s) Signed: 07/23/2022 3:54:34 PM By: Worthy Keeler [name] Signed: 07/26/2022 4:15:07 PM By: Massie Kluver Entered By: Massie Kluver on 07/23/2022 12:27:44 Hession, Wynell Balloon ([id number]) -------------------------------------------------------------------------------- Problem List Details Patient Name: [name] Date of Service: 07/23/2022 11:15 AM Medical Record Number: [id number] Patient Account Number: [id number] Date of Birth/Sex: 05-Feb-1936 (85 y.o. M) Treating RN: Cornell Barman Primary Care Provider: [name] Other Clinician: Massie Kluver Referring Provider: Roland Rack Treating Provider/Extender: Skipper Cliche in Treatment: 2 Active Problems ICD-10 Encounter Code Description Active Date MDM Diagnosis [id number] Laceration without foreign body of right forearm, initial encounter 07/09/2022 No Yes J44.9 Chronic obstructive pulmonary disease, unspecified 07/09/2022 No Yes Inactive Problems Resolved Problems Electronic Signature(s) Signed: 07/23/2022 11:53:44 AM By: Worthy Keeler [name] Entered By: Worthy Keeler on 07/23/2022 11:53:44 Horkey, Wynell Balloon ([id number]) -------------------------------------------------------------------------------- Progress Note  Details Patient Name: [name] Date of Service: 07/23/2022 11:15 AM Medical Record Number: [id number] Patient Account Number: [id number] Date of Birth/Sex: 1936/09/17 (85 y.o. M) Treating RN: Cornell Barman Primary Care Provider: [name] Other Clinician: Massie Kluver Referring Provider: Roland Rack Treating Provider/Extender: Skipper Cliche in Treatment: 2 Subjective Chief Complaint Information obtained from Patient Right forearm laceration History of Present Illness (HPI) 07-09-2022 upon evaluation today patient appears to be doing well currently in regard to her significant skin tears that occurred over the right forearm and hand location. The hand was sutured the forearm with Steri-Strips. Nonetheless the hand is completely closed the forearm and may have some areas that are still open based on what we are seeing but it does look like it is doing much better than where things started he tells me that it was really torn up when it first happened. Patient does have a history of COPD but otherwise really no major medical problems other than a history of prostate cancer which has now resolved. 07-23-2022 upon evaluation today patient appears to be doing well currently in regard to his forearm. In fact this appears to be completely healed based on what I am seeing. I do not see any evidence of active infection locally or systemically at this time which is great news. No fevers, chills, nausea, vomiting, or diarrhea. Objective Constitutional Well-nourished and well-hydrated in no acute distress. Vitals Time Taken: 12:04 PM, Height: 69 in, Weight: 143 lbs, BMI: 21.1, Temperature: 98.4 F, Pulse: 64 bpm, Respiratory Rate: 16 breaths/min, Blood Pressure: 129/62 mmHg. Respiratory normal breathing without difficulty. Psychiatric this patient is able to make decisions and demonstrates good insight into disease process. Alert and Oriented x 3. pleasant and cooperative. General  Notes: Upon inspection I see complete epithelization in regard to the forearm I do believe still protecting this with the Xeroform gauze as well as the ABD pad and stretch net is a good way to go. I discussed that with the patient and his daughter today. They are going to keep this in place that will help for protection sake and allowing this to toughen up. Integumentary (Hair, Skin) Wound #1 status is Healed - Epithelialized. Original cause of wound was Trauma. The date acquired was: 06/19/2022. The wound has been in treatment 2 weeks. The wound is located on the Right Forearm. The wound measures 0cm  length x 0cm width x 0cm depth; 0cm^2 area and 0cm^3 volume. There is a none present amount of drainage noted. There is large (67-100%) granulation within the wound bed. There is no necrotic tissue within the wound bed. Assessment Active Problems ICD-10 Laceration without foreign body of right forearm, initial encounter Chronic obstructive pulmonary disease, unspecified [place], [place] Carlean Jews ([id number]) Plan Discharge From [facility] Services: Discharge from [facility] Complete - wound healed. Please call if any other issues arise Additional Orders / Instructions: Other: - use yellow xeroform dressing for next 2 weeks 1. Would recommend currently that we going continue with the wound care measures as before and the patient is in agreement with plan this includes the use of the Xeroform gauze dressing along with a protective dressing to cover secured with stretch net which will help to protect for the next 2 weeks. 2. With regard to healing however this wound is completely closed and I do think with appropriate protection that should toughen up and after 2 weeks he will not need any additional dressings in place. We will see patient back for reevaluation in 1 week here in the clinic. If anything worsens or changes patient will contact our office for additional recommendations. Electronic  Signature(s) Signed: 07/23/2022 3:48:31 PM By: Worthy Keeler [name] Entered By: Worthy Keeler on 07/23/2022 15:48:31 Duck, Wynell Balloon ([id number]) -------------------------------------------------------------------------------- SuperBill Details Patient Name: [name] Date of Service: 07/23/2022 Medical Record Number: [id number] Patient Account Number: [id number] Date of Birth/Sex: 26-Aug-1936 (86 y.o. M) Treating RN: Cornell Barman Primary Care Provider: [name] Other Clinician: Massie Kluver Referring Provider: Roland Rack Treating Provider/Extender: Skipper Cliche in Treatment: 2 Diagnosis Coding ICD-10 Codes Code Description [phone] Laceration without foreign body of right forearm, initial encounter J44.9 Chronic obstructive pulmonary disease, unspecified Facility Procedures CPT4 Code: 71696789 Description: 99213 - WOUND CARE VISIT-LEV 3 EST PT Modifier: Quantity: 1 Physician Procedures CPT4 Code: 3810175 Description: 10258 - WC PHYS LEVEL 3 - EST PT Modifier: Quantity: 1 CPT4 Code: Description: ICD-10 Diagnosis Description N27.782U Laceration without foreign body of right forearm, initial encounter J44.9 Chronic obstructive pulmonary disease, unspecified Modifier: Quantity: Electronic Signature(s) Signed: 07/23/2022 3:48:49 PM By: Worthy Keeler [name] Entered By: Worthy Keeler on 07/23/2022 15:48:49

## 2022-07-26 ENCOUNTER — Inpatient Hospital Stay: Payer: PPO | Attending: Oncology

## 2022-07-26 ENCOUNTER — Inpatient Hospital Stay (HOSPITAL_BASED_OUTPATIENT_CLINIC_OR_DEPARTMENT_OTHER): Payer: PPO | Admitting: Oncology

## 2022-07-26 ENCOUNTER — Encounter: Payer: Self-pay | Admitting: Oncology

## 2022-07-26 VITALS — BP 136/86 | HR 86 | Temp 98.7°F | Resp 20 | Wt 140.5 lb

## 2022-07-26 DIAGNOSIS — J439 Emphysema, unspecified: Secondary | ICD-10-CM | POA: Diagnosis not present

## 2022-07-26 DIAGNOSIS — Z809 Family history of malignant neoplasm, unspecified: Secondary | ICD-10-CM | POA: Insufficient documentation

## 2022-07-26 DIAGNOSIS — R5383 Other fatigue: Secondary | ICD-10-CM | POA: Diagnosis not present

## 2022-07-26 DIAGNOSIS — Z720 Tobacco use: Secondary | ICD-10-CM | POA: Diagnosis not present

## 2022-07-26 DIAGNOSIS — J438 Other emphysema: Secondary | ICD-10-CM | POA: Diagnosis not present

## 2022-07-26 DIAGNOSIS — C61 Malignant neoplasm of prostate: Secondary | ICD-10-CM | POA: Insufficient documentation

## 2022-07-26 DIAGNOSIS — Z8041 Family history of malignant neoplasm of ovary: Secondary | ICD-10-CM | POA: Diagnosis not present

## 2022-07-26 LAB — COMPREHENSIVE METABOLIC PANEL
ALT: 9 U/L (ref 0–44)
AST: 19 U/L (ref 15–41)
Albumin: 4 g/dL (ref 3.5–5.0)
Alkaline Phosphatase: 106 U/L (ref 38–126)
Anion gap: 7 (ref 5–15)
BUN: 20 mg/dL (ref 8–23)
CO2: 31 mmol/L (ref 22–32)
Calcium: 9 mg/dL (ref 8.9–10.3)
Chloride: 102 mmol/L (ref 98–111)
Creatinine, Ser: 0.96 mg/dL (ref 0.61–1.24)
GFR, Estimated: >60 mL/min (ref >60)
Glucose, Bld: 105 mg/dL — ABNORMAL HIGH (ref 70–99)
Potassium: 4.1 mmol/L (ref 3.5–5.1)
Sodium: 140 mmol/L (ref 135–145)
Total Bilirubin: 0.2 mg/dL — ABNORMAL LOW (ref 0.3–1.2)
Total Protein: 7.4 g/dL (ref 6.5–8.1)

## 2022-07-26 LAB — CBC WITH DIFFERENTIAL/PLATELET
Abs Immature Granulocytes: 0.04 10*3/uL (ref 0.00–0.07)
Basophils Absolute: 0 10*3/uL (ref 0.0–0.1)
Basophils Relative: 0 %
Eosinophils Absolute: 0 10*3/uL (ref 0.0–0.5)
Eosinophils Relative: 1 %
HCT: 41.6 % (ref 39.0–52.0)
Hemoglobin: 13.4 g/dL (ref 13.0–17.0)
Immature Granulocytes: 1 %
Lymphocytes Relative: 36 %
Lymphs Abs: 2 10*3/uL (ref 0.7–4.0)
MCH: 28.6 pg (ref 26.0–34.0)
MCHC: 32.2 g/dL (ref 30.0–36.0)
MCV: 88.9 fL (ref 80.0–100.0)
Monocytes Absolute: 0.9 10*3/uL (ref 0.1–1.0)
Monocytes Relative: 16 %
Neutro Abs: 2.6 10*3/uL (ref 1.7–7.7)
Neutrophils Relative %: 46 %
Platelets: 233 10*3/uL (ref 150–400)
RBC: 4.68 MIL/uL (ref 4.22–5.81)
RDW: 13.7 % (ref 11.5–15.5)
WBC: 5.6 10*3/uL (ref 4.0–10.5)
nRBC: 0 % (ref 0.0–0.2)

## 2022-07-26 LAB — PSA: Prostatic Specific Antigen: 3.02 ng/mL (ref 0.00–4.00)

## 2022-07-26 NOTE — Progress Notes (Signed)
Hematology/Oncology Progress note [phone] [phone]      Patient Care Team: Chrismon, Vickki Muff, [name] (Inactive) as PCP - General (Physician Assistant) Garrel Ridgel, DPM as Consulting Physician (Podiatry) Noreene Filbert, Wood as Referring Physician (Radiation Oncology) Idelle Leech, OD as Consulting Physician (Optometry) Pleasant Server, Wood as Consulting Physician (Oncology)   ASSESSMENT & PLAN:   Malignant neoplasm of prostate [facility]) Prostate Cancer with biochemical recurrence, asymptomatic, castration resistant nonmetastatic.  Labs reviewed and discussed with patient. Patient is currently off darolutamide due to severe fatigue. PSA is rising while off the treatments. We discussed about switching to other oral agents and he is interested.  We will check for insurance coverage is. Previous PSMA PET scan showed elevated SUV in the prostate/prostate bed, consider future reestablishment of Radonc for evaluation of additional radiation. ADT with Eligard '30mg'$  Q36month  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HCC) OPD/Emphysema, recommend smoke cessation.  Patient declines pulmonology follow-ups.  He does not actively follow-up with PCP Continue albuterol as needed for wheezing/shortness of breath, Advair  twice daily-patient admits not being very compliant.  We discussed about importance of compliance of medication.  No orders of the defined types were placed in this encounter.  Follow up  TBD  All questions were answered. The patient knows to call the clinic with any problems, questions or concerns.  ZEarlie Server MD, PhD [facility] Hemat[facility] Oncology 07/26/2022    HISTORY OF PRESENTING ILLNESS:  ELudger Wood is a  86y.o.  male with PMH listed below who was referred to me for evaluation of prostate cancer.   Cancer history dated back to 2009 when He was diagnosed with intermediate risk, Gleason 3+4, T1c prostate cancer in 2009. PSA at diagnosis was  6.1. IMRT was completed in August 2009. PSA nadir was 0.5. His PSA January 2013 was 0.8 and in January 2014 was 1.3. This was repeated in May 2014 and had increased to 1.8. He elected a repeat prostate biopsy in November 2014 after his PSA in October had increased to 2.2. This showed a focus of Gleason 3+3 adenocarcinoma from the right prostate involving less than 5%. He elected surveillance after discussing options. In May 2016, he was evaluated by Dr.Chrystal and he received salvage seed implantation with I-125. He subsequently follows with Dr.Chrystal for Lupron every 4 months since then. Achieve PSA nadir of 0.06 in 09/2016, and started to trend up and most recently trended up to 9.64.   # Lupron 30 mg on 05/10/2018, 06/14/2018 testosterone level was at 6  # Leupron 22.'5mg'$  11/06/2018.  Delayed as patient reports feeling very fatigued and we discussed about holding Lupron.  277-monthnd restarted on 11/06/2018.  10/06/2021, PSMA PET scan showed marked radiotracer accumulation with elevated SUV in the prostate/prostate bed tracking towards the bladder base with distortion of the urinary bladder.  Signs of brachytherapy in the prostate as well.  No definitive signs of extraprostatic disease.  A single lymph node in the left the pelvis with only minimally elevated SUV.  Equivocal.  Aortic atherosclerosis.  10/20/2021 Darolutamide '600mg'$  BID 01/01/2022, decreased to Darolutamide '300mg'$  BID 06/10/2022 Darolutamide '300mg'$  BID held  INTERVAL HISTORY EaRamsay Wood a [age].o. male who has above history reviewed by me present for follow-up of management of biochemical recurrence of prostate cancer Patient has been off darolutamide since last visit.  He reports fatigue is much better currently.  He recently had soft tissue injury and acute foot fracture secondary to incident  while using wood wood splinter.  He wears right lower extremity boots. Chronic shortness of breath, unchanged.  He is not compliant with  his bronchodilators.      Review of Systems  Constitutional:  Positive for malaise/fatigue. Negative for chills, fever and weight loss.  HENT:  Negative for sore throat.   Eyes:  Negative for redness.  Respiratory:  Positive for shortness of breath. Negative for cough and wheezing.   Cardiovascular:  Negative for chest pain, palpitations and leg swelling.  Gastrointestinal:  Negative for abdominal pain, blood in stool, nausea and vomiting.  Genitourinary:  Negative for dysuria.  Musculoskeletal:  Negative for myalgias.  Skin:  Negative for rash.  Neurological:  Negative for dizziness, tingling and tremors.  Endo/Heme/Allergies:  Does not bruise/bleed easily.  Psychiatric/Behavioral:  Negative for hallucinations.     MEDICAL HISTORY:  Past Medical History:  Diagnosis Date   Arthritis    COPD (chronic obstructive pulmonary disease) (HCC)    Elevated PSA    Hematuria, gross    History of hiatal hernia    HOH (hard of hearing)    Kidney stone    Prostate cancer ([place])    Shortness of breath dyspnea     SURGICAL HISTORY: Past Surgical History:  Procedure Laterality Date   [place]  06/24/2015   Procedure: CYSTOSCOPY;  Surgeon: Hollice Espy, Wood;  Location: [facility] ORS;  Service: Urology;;   HERNIA REPAIR     left and right inguinal hernia repair   RADIOACTIVE SEED IMPLANT N/A 06/24/2015   Procedure: RADIOACTIVE SEED IMPLANT/BRACHYTHERAPY IMPLANT;  Surgeon: Hollice Espy, Wood;  Location: [facility] ORS;  Service: Urology;  Laterality: N/A;    SOCIAL HISTORY: Social History   Socioeconomic History   Marital status: Married    Spouse name: Not on file   Number of children: 1   Years of education: Not on file   Highest education level: 8th grade  Occupational History   Occupation: retired  Tobacco Use   Smoking status: Former    Packs/day: 1.50    Types: Cigarettes    Quit date: 05/24/1981    Years since quitting: 41.2   Smokeless tobacco: Current     Types: Chew  Vaping Use   Vaping Use: Never used  Substance and Sexual Activity   Alcohol use: Not Currently    Alcohol/week: 0.0 standard drinks of alcohol   Drug use: No   Sexual activity: Not on file  Other Topics Concern   Not on file  Social History Narrative   Not on file   Social Determinants of Health   Financial Resource Strain: Low Risk  (04/06/2018)   Overall Financial Resource Strain (CARDIA)    Difficulty of Paying Living Expenses: Not hard at all  Food Insecurity: No Food Insecurity (04/06/2018)   Hunger Vital Sign    Worried About Running Out of Food in the Last Year: Never true    [place] in the Last Year: Never true  Transportation Needs: No Transportation Needs (04/06/2018)   PRAPARE - Hydrologist (Medical): No    Lack of Transportation (Non-Medical): No  Physical Activity: Inactive (09/19/2019)   Exercise Vital Sign    Days of Exercise per Week: 0 days    Minutes of Exercise per Session: 0 min  Stress: No Stress Concern Present (09/19/2019)   [place]    Feeling of Stress :  Not at all  Social Connections: Unknown (09/19/2019)   Social Connection and Isolation Panel [NHANES]    Frequency of Communication with Friends and Family: Patient refused    Frequency of Social Gatherings with Friends and Family: Patient refused    Attends Religious Services: Patient refused    Active Member of Clubs or Organizations: Patient refused    Attends Archivist Meetings: Patient refused    Marital Status: Patient refused  Intimate Partner Violence: Unknown (09/19/2019)   Humiliation, Afraid, Rape, and Kick questionnaire    Fear of Current or Ex-Partner: Patient refused    Emotionally Abused: Patient refused    Physically Abused: Patient refused    Sexually Abused: Patient refused    FAMILY HISTORY: Family History  Problem Relation Age of Onset   Cancer  Brother    Heart disease Brother    Diabetes Brother    Emphysema Brother    Prostate cancer Brother    Diabetes Sister    Emphysema Mother    Healthy Sister    Healthy Sister    Healthy Sister    Diabetes Brother    Diabetes Brother    Diabetes Brother    Healthy Brother     ALLERGIES:  is allergic to [employer] [lovastatin], niacin and related, statins, and aleve [naproxen sodium].  MEDICATIONS:  Current Outpatient Medications  Medication Sig Dispense Refill   albuterol (VENTOLIN HFA) 108 (90 Base) MCG/ACT inhaler Inhale 2 puffs into the lungs every 6 (six) hours as needed for wheezing or shortness of breath. 8 g 2   calcium-vitamin D (OSCAL WITH D) 500-5 MG-MCG tablet Take 2 tablets by mouth daily. 90 tablet 1   fluticasone-salmeterol (ADVAIR DISKUS) 250-50 MCG/ACT AEPB Inhale 1 puff into the lungs in the morning and at bedtime. 60 each 1   traMADol (ULTRAM) 50 MG tablet Take by mouth.     No current facility-administered medications for this visit.     PHYSICAL EXAMINATION: ECOG PERFORMANCE STATUS: 1 - Symptomatic but completely ambulatory Vitals:   07/26/22 1445  BP: 136/86  Pulse: 86  Resp: 20  Temp: 98.7 F (37.1 C)  SpO2: 100%   Filed Weights   07/26/22 1445  Weight: 140 lb 8 oz (63.7 kg)     Physical Exam Constitutional:      General: He is not in acute distress.    Comments: Patient walks independently.  HENT:     Head: Normocephalic and atraumatic.  Eyes:     General: No scleral icterus.    Conjunctiva/sclera: Conjunctivae normal.     Pupils: Pupils are equal, round, and reactive to light.  Cardiovascular:     Rate and Rhythm: Normal rate and regular rhythm.     Heart sounds: Normal heart sounds.  Pulmonary:     Effort: Pulmonary effort is normal. No respiratory distress.     Breath sounds: No wheezing or rales.     Comments: Decreased breath sound bilaterally Chest:     Chest wall: No tenderness.  Abdominal:     General: Bowel sounds are  normal. There is no distension.     Palpations: Abdomen is soft. There is no mass.     Tenderness: There is no abdominal tenderness.  Musculoskeletal:        General: No deformity. Normal range of motion.     Cervical back: Normal range of motion and neck supple.  Lymphadenopathy:     Cervical: No cervical adenopathy.  Skin:    General: Skin is  warm and dry.     Findings: No erythema or rash.  Neurological:     Mental Status: He is alert and oriented to person, place, and time. Mental status is at baseline.     Cranial Nerves: No cranial nerve deficit.     Coordination: Coordination normal.      LABORATORY DATA:  I have reviewed the data as listed Lab Results  Component Value Date   WBC 5.6 07/26/2022   HGB 13.4 07/26/2022   HCT 41.6 07/26/2022   MCV 88.9 07/26/2022   PLT 233 07/26/2022   Recent Labs    04/13/22 0929 06/10/22 0954 07/26/22 1354  NA 139 141 140  K 3.5 3.8 4.1  CL 102 101 102  CO2 32 32 31  GLUCOSE 106* 112* 105*  BUN '16 19 20  '$ CREATININE 0.89 0.98 0.96  CALCIUM 8.8* 8.8* 9.0  GFRNONAA >60 >60 >60  PROT 6.8 7.1 7.4  ALBUMIN 3.9 4.0 4.0  AST '16 18 19  '$ ALT '9 9 9  '$ ALKPHOS 56 82 106  BILITOT 0.3 0.4 0.2*     RADIOGRAPHIC STUDIES: I have personally reviewed the radiological images as listed and agreed with the findings in the report. CT FOOT RIGHT WO CONTRAST  Result Date: 06/25/2022 CLINICAL DATA:  Sean Wood chipper fell on right foot. EXAM: CT OF THE RIGHT FOOT WITHOUT CONTRAST TECHNIQUE: Multidetector CT imaging of the right foot was performed according to the standard protocol. Multiplanar CT image reconstructions were also generated. RADIATION DOSE REDUCTION: This exam was performed according to the departmental dose-optimization program which includes automated exposure control, adjustment of the mA and/or kV according to patient size and/or use of iterative reconstruction technique. COMPARISON:  None Available. FINDINGS: Wood/Joint/Cartilage Acute  comminuted depressed fracture of the calcaneus extending into the subtalar joint. Primary fracture line courses through the central portion of the posterior facet. 1-2 mm articular surface depression of the posterior facet medially. Tiny avulsion fracture of the medial tubercle of the posterior talar process (series 11, image 61). No dislocation. Joint spaces are relatively preserved. Small tibiotalar and subtalar joint effusions. Ligaments Ligaments are suboptimally evaluated by CT. Muscles and Tendons Grossly intact. Soft tissue Diffuse soft tissue swelling. No fluid collection or hematoma. No soft tissue mass. IMPRESSION: 1. Acute Sanders type 2b fracture of the calcaneus as described above. 2. Tiny avulsion fracture of the medial tubercle of the posterior talar process. Electronically Signed   By: Titus Dubin M.D.   On: 06/25/2022 17:49

## 2022-07-26 NOTE — Assessment & Plan Note (Signed)
OPD/Emphysema, recommend smoke cessation.  Patient declines pulmonology follow-ups.  He does not actively follow-up with PCP Continue albuterol as needed for wheezing/shortness of breath, Advair  twice daily-patient admits not being very compliant.  We discussed about importance of compliance of medication.

## 2022-07-26 NOTE — Assessment & Plan Note (Addendum)
Prostate Cancer with biochemical recurrence, asymptomatic, castration resistant nonmetastatic.  Labs reviewed and discussed with patient. Patient is currently off darolutamide due to severe fatigue. PSA is rising while off the treatments. We discussed about switching to other oral agents and he is interested.  We will check for insurance coverage is. Previous PSMA PET scan showed elevated SUV in the prostate/prostate bed, consider future reestablishment of Radonc for evaluation of additional radiation. ADT with Eligard '30mg'$  Q54month

## 2022-07-27 ENCOUNTER — Other Ambulatory Visit (HOSPITAL_COMMUNITY): Payer: Self-pay

## 2022-07-27 ENCOUNTER — Telehealth: Payer: Self-pay | Admitting: Pharmacy Technician

## 2022-07-27 ENCOUNTER — Telehealth: Payer: Self-pay | Admitting: Pharmacist

## 2022-07-27 DIAGNOSIS — C61 Malignant neoplasm of prostate: Secondary | ICD-10-CM

## 2022-07-27 MED ORDER — APALUTAMIDE 60 MG PO TABS
240.0000 mg | ORAL_TABLET | Freq: Every day | ORAL | 0 refills | Status: DC
  Filled 2022-07-27: qty 120, 30d supply, fill #0

## 2022-07-27 NOTE — Telephone Encounter (Signed)
Oral Oncology Patient Advocate Encounter   Received notification that prior authorization for Sean Wood is required.   PA submitted on 07/27/2022 Key [id number] Status is pending     Lady Deutscher, CPhT-Adv Pharmacy Patient Advocate Specialist [place] Patient Advocate Team Direct Number: [phone]  Fax: [phone]

## 2022-07-27 NOTE — Telephone Encounter (Signed)
Oral Chemotherapy Pharmacist Encounter  [place] will deliver medication on 07/29/22, he will get started when he has medication in hand.  Patient Education I spoke with patient for overview of new oral chemotherapy medication: Erleada (apalutamide) for the treatment of nonmetastatic, castration resistant prostate cancer in conjunction with ADT, planned duration until disease progression or unacceptable drug toxicity. Patient was intolerant to daralutamide (fatigue).   Pt is doing well. Counseled patient on administration, dosing, side effects, monitoring, drug-food interactions, safe handling, storage, and disposal. Patient will take 4 tablets (240 mg total) by mouth daily.  Side effects include but not limited to: fatigue, rash, decreased.    Reviewed with patient importance of keeping a medication schedule and plan for any missed doses.  After discussion with patient no patient barriers to medication adherence identified.   Mr. Migues voiced understanding and appreciation. All questions answered. Medication handout provided.  Provided patient with Oral [facility] phone number. Patient knows to call the office with questions or concerns. Oral Chemotherapy Navigation Clinic will continue to follow.  Darl Pikes, PharmD, BCPS, BCOP, CPP Hematology/Oncology Clinical Pharmacist Practitioner [place]/DB/AP Oral [facility] [phone]  07/27/2022 3:24 PM

## 2022-07-27 NOTE — Telephone Encounter (Signed)
Oral Oncology Patient Advocate Encounter  Prior Authorization for Sean Wood has been approved.    PA# [id number] Effective dates: 07/27/2022 through 07/28/2023  Patients co-pay is $1,386.47. Patient has active grant coverage from [employer] and [employer] that will cover this copay.   Lady Deutscher, CPhT-Adv Pharmacy Patient Advocate Specialist [place] Patient Advocate Team Direct Number: [phone]  Fax: [phone]

## 2022-07-27 NOTE — Telephone Encounter (Signed)
Oral Oncology Pharmacist Encounter  Received new prescription for Erleada (apalutamide) for the treatment of nonmetastatic, castration resistant prostate cancer in conjunction with ADT, planned duration until disease progression or unacceptable drug toxicity. Patient was intolerant to daralutamide (fatigue).  Prescription dose and frequency assessed.   Current medication list in Epic reviewed, one DDIs with apalutamide identified: Tramadol: Apalutamide may decrease the concentration of tramadol. Monitor patient of decrease effectiveness of tramadol.   Evaluated chart and no patient barriers to medication adherence identified.   Prescription has been e-scribed to the [facility] for benefits analysis and approval.  Oral Oncology Clinic will continue to follow for insurance authorization, copayment issues, initial counseling and start date.  Patient agreed to treatment on 07/26/22 per MD documentation.  Darl Pikes, PharmD, BCPS, BCOP, CPP Hematology/Oncology Clinical Pharmacist Practitioner [place]/DB/AP Oral [facility] [phone]  07/27/2022 11:09 AM

## 2022-07-29 ENCOUNTER — Encounter: Payer: Self-pay | Admitting: Oncology

## 2022-08-03 DIAGNOSIS — S92011D Displaced fracture of body of right calcaneus, subsequent encounter for fracture with routine healing: Secondary | ICD-10-CM | POA: Diagnosis not present

## 2022-08-03 DIAGNOSIS — M79671 Pain in right foot: Secondary | ICD-10-CM | POA: Diagnosis not present

## 2022-08-05 ENCOUNTER — Encounter: Payer: Self-pay | Admitting: Nurse Practitioner

## 2022-08-05 ENCOUNTER — Ambulatory Visit: Payer: PPO | Admitting: Nurse Practitioner

## 2022-08-05 DIAGNOSIS — R630 Anorexia: Secondary | ICD-10-CM | POA: Diagnosis not present

## 2022-08-05 DIAGNOSIS — R5383 Other fatigue: Secondary | ICD-10-CM | POA: Diagnosis not present

## 2022-08-05 DIAGNOSIS — Z515 Encounter for palliative care: Secondary | ICD-10-CM | POA: Diagnosis not present

## 2022-08-05 NOTE — Progress Notes (Signed)
[facility] Consult Note Telephone: [phone]  Fax: [phone]    Date of encounter: 08/05/22 2:08 PM PATIENT NAME: Sean Wood [address] [place] [address]   [phone] (home)  DOB: [birth year] MRN: [id number] PRIMARY CARE PROVIDER:    Simona Huh Wood  RESPONSIBLE PARTY:    Contact Information     Name Relation Home Work Mobile   Sean Wood Son   [phone]   Sean Wood, Sean Wood Daughter [phone]        Due to the COVID-19 crisis, this visit was done via telemedicine from my office and it was initiated and consent by this patient and or family.  I connected with  Sean Wood. OR PROXY on 08/05/22 by telephone as video not available enabled telemedicine application and verified that I am speaking with the correct person using two identifiers.   I discussed the limitations of evaluation and management by telemedicine. The patient expressed understanding and agreed to proceed. Palliative Care was asked to follow this patient by consultation request of  Sean Murders PA to address advance care planning and complex medical decision making. This is a follow up visit.                                   ASSESSMENT AND PLAN / RECOMMENDATIONS:  Symptom Management/Plan: Advance Care Planning; DNR; goldenrod form in home   2. Fatigue secondary to prostate cancer with ongoing treatment as he currently is on an oral regimen; discussed at length about sleep patterns, hygiene, importance of self care, resting, Family members continue to rotate assisting Sean Wood as he is primary caregiver for his debilitated wife. Fall risk  3. Palliative care encounter; Palliative care encounter; Palliative medicine team will continue to support patient, patient's family, and medical team. Visit consisted of counseling and education dealing with the complex and emotionally intense issues of symptom management and  palliative care in the setting of serious and potentially life-threatening illness  4. Anorexia; discussed appetite, supplements, nutritional education done.  12/04/2021 weight 142 lbs 06/10/2022 weight 143.9 lbs 07/26/2022 weight 140.8 lbs   5. f/u 2 month for ongoing monitoring chronic disease progression, ongoing discussions complex medical decision making   Follow up Palliative Care Visit: Palliative care will continue to follow for complex medical decision making, advance care planning, and clarification of goals. Return 8 weeks or prn.   I spent 31 minutes providing this consultation. More than 50% of the time in this consultation was spent in counseling and care coordination. PPS: 50%   Chief Complaint: Follow up palliative consult for complex medical decision making   HISTORY OF PRESENT ILLNESS:  Sean Wood. is a 86 y.o. year old male  with multiple medical problems including prostate cancer s/p post brachytherapy with reoccurance 09/2021 on darolutamide, COPD, arthritis, h/o gross hematuria, h/o hiatial hernia, h/o kidney stone. I called Sean Wood for telemedicine telephonic PC f/u visit, Sean Wood in agreement. We talked about how he has been feeling. Sean Wood endorses he is doing better, no side effects as of yet. Sean Wood endorses "the side effects of chemotherapy, medications can be worse than the medicine itself". We talked about ros, currently no pain. We talked about fatigue, weakness. We talked about appetite, weight, nutritional education done with decrease appetite, supplements. We talked about medical goals, recent visit with Dr Tasia Catchings Oncology; 07/26/2022 with PSA rising,  off darolutamide due to severe gatigue, continue with eligard q37month and previous PSMA PET scan showed elevated SUV in prostate/prostate bed, consider future re-establishment of radonc. Interested in switching to another oral agent, was checking insurance. Erleada had been approved and he started  without side effects. He has a active grant coverage to cover co-pay from [employer] We talked about medical goals, expectations. We talked about functional abilities at home, what needs he had "I am okay right now". We talked about f/u pc visit, in agreement for telephone visit, scheduled. Therapeutic listening, emotional support provided. Questions answered. Sean. HCrocketcurrently declined immediate needs.    We talked about quality of life. We talked about f/u pc visit, Sean. HArnette Norrisin agreement. Scheduled. Therapeutic listening, emotional support provide.d Questions answered.     History obtained from review of EMR, discussion with Sean. HMich  I reviewed available labs, medications, imaging, studies and related documents from the EMR.  Records reviewed and summarized above.    ROS 10 point systems reviewed all negative except HPI   Physical Exam: Thank you for the opportunity to participate in the care of Sean. HGin  The palliative care team will continue to follow. Please call our office at [phone] we can be of additional assistance.   Encarnacion Scioneaux ZIhor Gully NP

## 2022-08-17 ENCOUNTER — Encounter: Payer: Self-pay | Admitting: Oncology

## 2022-08-17 ENCOUNTER — Inpatient Hospital Stay (HOSPITAL_BASED_OUTPATIENT_CLINIC_OR_DEPARTMENT_OTHER): Payer: PPO | Admitting: Oncology

## 2022-08-17 ENCOUNTER — Inpatient Hospital Stay: Payer: PPO | Admitting: Pharmacist

## 2022-08-17 ENCOUNTER — Other Ambulatory Visit (HOSPITAL_COMMUNITY): Payer: Self-pay

## 2022-08-17 ENCOUNTER — Inpatient Hospital Stay: Payer: PPO

## 2022-08-17 DIAGNOSIS — J438 Other emphysema: Secondary | ICD-10-CM | POA: Diagnosis not present

## 2022-08-17 DIAGNOSIS — C61 Malignant neoplasm of prostate: Secondary | ICD-10-CM | POA: Diagnosis not present

## 2022-08-17 LAB — COMPREHENSIVE METABOLIC PANEL
ALT: 20 U/L (ref 0–44)
AST: 25 U/L (ref 15–41)
Albumin: 3.9 g/dL (ref 3.5–5.0)
Alkaline Phosphatase: 107 U/L (ref 38–126)
Anion gap: 5 (ref 5–15)
BUN: 17 mg/dL (ref 8–23)
CO2: 32 mmol/L (ref 22–32)
Calcium: 8.9 mg/dL (ref 8.9–10.3)
Chloride: 102 mmol/L (ref 98–111)
Creatinine, Ser: 0.86 mg/dL (ref 0.61–1.24)
GFR, Estimated: >60 mL/min (ref >60)
Glucose, Bld: 106 mg/dL — ABNORMAL HIGH (ref 70–99)
Potassium: 4.1 mmol/L (ref 3.5–5.1)
Sodium: 139 mmol/L (ref 135–145)
Total Bilirubin: 0.3 mg/dL (ref 0.3–1.2)
Total Protein: 7.1 g/dL (ref 6.5–8.1)

## 2022-08-17 LAB — CBC WITH DIFFERENTIAL/PLATELET
Abs Immature Granulocytes: 0.02 10*3/uL (ref 0.00–0.07)
Basophils Absolute: 0 10*3/uL (ref 0.0–0.1)
Basophils Relative: 0 %
Eosinophils Absolute: 0 10*3/uL (ref 0.0–0.5)
Eosinophils Relative: 0 %
HCT: 39 % (ref 39.0–52.0)
Hemoglobin: 12.6 g/dL — ABNORMAL LOW (ref 13.0–17.0)
Immature Granulocytes: 0 %
Lymphocytes Relative: 47 %
Lymphs Abs: 2.3 10*3/uL (ref 0.7–4.0)
MCH: 28.3 pg (ref 26.0–34.0)
MCHC: 32.3 g/dL (ref 30.0–36.0)
MCV: 87.6 fL (ref 80.0–100.0)
Monocytes Absolute: 0.9 10*3/uL (ref 0.1–1.0)
Monocytes Relative: 17 %
Neutro Abs: 1.8 10*3/uL (ref 1.7–7.7)
Neutrophils Relative %: 36 %
Platelets: 245 10*3/uL (ref 150–400)
RBC: 4.45 MIL/uL (ref 4.22–5.81)
RDW: 13.6 % (ref 11.5–15.5)
WBC: 5 10*3/uL (ref 4.0–10.5)
nRBC: 0 % (ref 0.0–0.2)

## 2022-08-17 LAB — PSA: Prostatic Specific Antigen: 1.03 ng/mL (ref 0.00–4.00)

## 2022-08-17 MED ORDER — APALUTAMIDE 60 MG PO TABS
240.0000 mg | ORAL_TABLET | Freq: Every day | ORAL | 0 refills | Status: DC
  Filled 2022-08-17 – 2022-08-19 (×2): qty 120, 30d supply, fill #0

## 2022-08-17 NOTE — Progress Notes (Unsigned)
[facility]  Telephone:([phone] [phone]  Patient Care Team: Chrismon, Vickki Muff, [name] (Inactive) as PCP - General (Physician Assistant) Garrel Ridgel, DPM as Consulting Physician (Podiatry) Noreene Filbert, MD as Referring Physician (Radiation Oncology) Rayetta Humphrey as Consulting Physician (Optometry) Jarold Server, MD as Consulting Physician (Oncology)   Name of the patient: Sean Wood  [id number]  Sep 20, 1936   Date of visit: 08/17/22  HPI: Patient is a 86 y.o. male with nonmetastatic, castration resistant prostate cancer. Patient was intolerant to daralutamide (fatigue), so he was recently switched to Erleada (apalutamide). He started this around 07/29/22  Reason for Consult: Oral chemotherapy follow-up for apalutamide therapy.   PAST MEDICAL HISTORY: Past Medical History:  Diagnosis Date   Arthritis    COPD (chronic obstructive pulmonary disease) (HCC)    Elevated PSA    Hematuria, gross    History of hiatal hernia    HOH (hard of hearing)    Kidney stone    Prostate cancer ([facility])    Shortness of breath dyspnea     HEMATOLOGY/ONCOLOGY HISTORY:  Oncology History  CA of prostate ([place] [facility])  06/01/2013 Initial Diagnosis   CA of prostate ([facility])    Chemotherapy      Radiation Therapy       ALLERGIES:  is allergic to mevacor [lovastatin], niacin and related, statins, and aleve [naproxen sodium].  MEDICATIONS:  Current Outpatient Medications  Medication Sig Dispense Refill   albuterol (VENTOLIN HFA) 108 (90 Base) MCG/ACT inhaler Inhale 2 puffs into the lungs every 6 (six) hours as needed for wheezing or shortness of breath. 8 g 2   apalutamide (ERLEADA) 60 MG tablet Take 4 tablets (240 mg total) by mouth daily. 120 tablet 0   calcium-vitamin D (OSCAL WITH D) 500-5 MG-MCG tablet Take 2 tablets by mouth daily. (Patient not taking: Reported on 08/17/2022) 90 tablet 1   fluticasone-salmeterol (ADVAIR DISKUS) 250-50  MCG/ACT AEPB Inhale 1 puff into the lungs in the morning and at bedtime. 60 each 1   traMADol (ULTRAM) 50 MG tablet Take by mouth. (Patient not taking: Reported on 08/17/2022)     No current facility-administered medications for this visit.    VITAL SIGNS: There were no vitals taken for this visit. There were no vitals filed for this visit.  Estimated body mass index is 21.07 kg/m as calculated from the following:   Height as of 03/10/21: '5\' 9"'$  (1.753 m).   Weight as of an earlier encounter on 08/17/22: 64.7 kg (142 lb 11.2 oz).  LABS: CBC:    Component Value Date/Time   WBC 5.0 08/17/2022 1308   HGB 12.6 (L) 08/17/2022 1308   HGB 14.1 03/05/2019 0913   HCT 39.0 08/17/2022 1308   HCT 41.6 03/05/2019 0913   PLT 245 08/17/2022 1308   PLT 293 03/05/2019 0913   MCV 87.6 08/17/2022 1308   MCV 85 03/05/2019 0913   NEUTROABS 1.8 08/17/2022 1308   NEUTROABS 2.6 03/05/2019 0913   LYMPHSABS 2.3 08/17/2022 1308   LYMPHSABS 1.8 03/05/2019 0913   MONOABS 0.9 08/17/2022 1308   EOSABS 0.0 08/17/2022 1308   EOSABS 0.1 03/05/2019 0913   BASOSABS 0.0 08/17/2022 1308   BASOSABS 0.0 03/05/2019 0913   Comprehensive Metabolic Panel:    Component Value Date/Time   NA 139 08/17/2022 1308   NA 144 03/05/2019 0913   K 4.1 08/17/2022 1308   CL 102 08/17/2022 1308   CO2 32 08/17/2022 1308   BUN 17  08/17/2022 1308   BUN 16 03/05/2019 0913   CREATININE 0.86 08/17/2022 1308   GLUCOSE 106 (H) 08/17/2022 1308   CALCIUM 8.9 08/17/2022 1308   AST 25 08/17/2022 1308   ALT 20 08/17/2022 1308   ALKPHOS 107 08/17/2022 1308   BILITOT 0.3 08/17/2022 1308   BILITOT <0.2 03/05/2019 0913   PROT 7.1 08/17/2022 1308   PROT 6.2 03/05/2019 0913   ALBUMIN 3.9 08/17/2022 1308   ALBUMIN 4.3 03/05/2019 0913     Present during today's visit: patient only  Assessment and Plan: Reviewed CMP/CBC, Continue apalutamide '240mg'$  daily PSA decreased since last check   Oral Chemotherapy Side Effect/Intolerance:   Fatigue: patient continues to report fatigue, but thinks that his fatigue with apalutamide is less than what he had with daralutamide  Oral Chemotherapy Adherence: no missed dosed reported No patient barriers to medication adherence identified.   New medications: No new meds reported  Medication Access Issues: No issues, patient fills at [facility]  Patient expressed understanding and was in agreement with this plan. He also understands that He can call clinic at any time with any questions, concerns, or complaints.   Follow-up plan: RTC in one month  Thank you for allowing me to participate in the care of this very pleasant patient.   Time Total: 15 mins  Visit consisted of counseling and education on dealing with issues of symptom management in the setting of serious and potentially life-threatening illness.Greater than 50%  of this time was spent counseling and coordinating care related to the above assessment and plan.  Signed by: Darl Pikes, PharmD, BCPS, Salley Slaughter, CPP Hematology/Oncology Clinical Pharmacist Practitioner [place]/DB/AP Oral [facility] [phone]  08/17/2022 2:51 PM

## 2022-08-17 NOTE — Progress Notes (Signed)
Hematology/Oncology Progress note [phone] [phone]      Patient Care Team: Chrismon, Vickki Muff, [name] (Inactive) as PCP - General (Physician Assistant) Garrel Ridgel, DPM as Consulting Physician (Podiatry) Noreene Filbert, MD as Referring Physician (Radiation Oncology) Idelle Leech, OD as Consulting Physician (Optometry) Farren Server, MD as Consulting Physician (Oncology)   ASSESSMENT & PLAN:   Cancer Staging  No matching staging information was found for the patient.  Malignant neoplasm of prostate [facility]) Prostate Cancer with biochemical recurrence, asymptomatic, castration resistant nonmetastatic.  Labs reviewed and discussed with patient. Patient is currently off darolutamide due to severe fatigue. PSA is rising while off the treatments. Patient tolerates Apalutamide 240 mg daily.  Previous PSMA PET scan showed elevated SUV in the prostate/prostate bed, consider future reestablishment of Radonc for evaluation of additional radiation. Continue ADT plan to switch to Eligard '45mg'$  Q10month  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HCC) OPD/Emphysema, recommend smoke cessation.  Patient declines pulmonology follow-ups.  He does not actively follow-up with PCP Continue albuterol as needed for wheezing/shortness of breath, Advair  twice daily-patient admits not being very compliant.  We discussed about importance of compliance of medication.  Orders Placed This Encounter  Procedures   CBC with Differential/Platelet    Standing Status:   Future    Standing Expiration Date:   08/17/2023   Comprehensive metabolic panel    Standing Status:   Future    Standing Expiration Date:   08/17/2023   PSA    Standing Status:   Future    Standing Expiration Date:   08/17/2023    Follow up  1 month  All questions were answered. The patient knows to call the clinic with any problems, questions or concerns.  ZEarlie Server MD, PhD [facility] Oncology 08/17/2022     HISTORY OF PRESENTING ILLNESS:  Sean Wood is a  86y.o.  male with PMH listed below who was referred to me for evaluation of prostate cancer.   Cancer history dated back to 2009 when He was diagnosed with intermediate risk, Gleason 3+4, T1c prostate cancer in 2009. PSA at diagnosis was 6.1. IMRT was completed in August 2009. PSA nadir was 0.5. His PSA January 2013 was 0.8 and in January 2014 was 1.3. This was repeated in May 2014 and had increased to 1.8. He elected a repeat prostate biopsy in November 2014 after his PSA in October had increased to 2.2. This showed a focus of Gleason 3+3 adenocarcinoma from the right prostate involving less than 5%. He elected surveillance after discussing options. In May 2016, he was evaluated by Dr.Chrystal and he received salvage seed implantation with I-125. He subsequently follows with Dr.Chrystal for Lupron every 4 months since then. Achieve PSA nadir of 0.06 in 09/2016, and started to trend up and most recently trended up to 9.64.   # Lupron 30 mg on 05/10/2018, 06/14/2018 testosterone level was at 6  # Leupron 22.'5mg'$  11/06/2018.  Delayed as patient reports feeling very fatigued and we discussed about holding Lupron.  260-monthnd restarted on 11/06/2018.  10/06/2021, PSMA PET scan showed marked radiotracer accumulation with elevated SUV in the prostate/prostate bed tracking towards the bladder base with distortion of the urinary bladder.  Signs of brachytherapy in the prostate as well.  No definitive signs of extraprostatic disease.  A single lymph node in the left the pelvis with only minimally elevated SUV.  Equivocal.  Aortic atherosclerosis.  10/20/2021 Darolutamide '600mg'$  BID 01/01/2022, decreased to Darolutamide '300mg'$   BID 06/10/2022 Darolutamide '300mg'$  BID held 2023, started on apalutamide 240 mg daily. INTERVAL HISTORY Sean Wood. is a 86 y.o. male who has above history reviewed by me present for follow-up of management of biochemical  recurrence of prostate cancer Patient takes apalutamide 240 mg daily, overall he tolerates well Some chronic fatigue, not worse. Grieving.     Review of Systems  Constitutional:  Positive for malaise/fatigue. Negative for chills, fever and weight loss.  HENT:  Negative for sore throat.   Eyes:  Negative for redness.  Respiratory:  Positive for shortness of breath. Negative for cough and wheezing.   Cardiovascular:  Negative for chest pain, palpitations and leg swelling.  Gastrointestinal:  Negative for abdominal pain, blood in stool, nausea and vomiting.  Genitourinary:  Negative for dysuria.  Musculoskeletal:  Negative for myalgias.  Skin:  Negative for rash.  Neurological:  Negative for dizziness, tingling and tremors.  Endo/Heme/Allergies:  Does not bruise/bleed easily.  Psychiatric/Behavioral:  Negative for hallucinations.     MEDICAL HISTORY:  Past Medical History:  Diagnosis Date   Arthritis    COPD (chronic obstructive pulmonary disease) (HCC)    Elevated PSA    Hematuria, gross    History of hiatal hernia    HOH (hard of hearing)    Kidney stone    Prostate cancer ([place])    Shortness of breath dyspnea     SURGICAL HISTORY: Past Surgical History:  Procedure Laterality Date   [place]  06/24/2015   Procedure: CYSTOSCOPY;  Surgeon: Hollice Espy, MD;  Location: [facility] ORS;  Service: Urology;;   HERNIA REPAIR     left and right inguinal hernia repair   RADIOACTIVE SEED IMPLANT N/A 06/24/2015   Procedure: RADIOACTIVE SEED IMPLANT/BRACHYTHERAPY IMPLANT;  Surgeon: Hollice Espy, MD;  Location: [facility] ORS;  Service: Urology;  Laterality: N/A;    SOCIAL HISTORY: Social History   Socioeconomic History   Marital status: Married    Spouse name: Not on file   Number of children: 1   Years of education: Not on file   Highest education level: 8th grade  Occupational History   Occupation: retired  Tobacco Use   Smoking status: Former     Packs/day: 1.50    Types: Cigarettes    Quit date: 05/24/1981    Years since quitting: 41.2   Smokeless tobacco: Current    Types: Chew  Vaping Use   Vaping Use: Never used  Substance and Sexual Activity   Alcohol use: Not Currently    Alcohol/week: 0.0 standard drinks of alcohol   Drug use: No   Sexual activity: Not on file  Other Topics Concern   Not on file  Social History Narrative   Not on file   Social Determinants of Health   Financial Resource Strain: Low Risk  (04/06/2018)   Overall Financial Resource Strain (CARDIA)    Difficulty of Paying Living Expenses: Not hard at all  Food Insecurity: No Food Insecurity (04/06/2018)   Hunger Vital Sign    Worried About Running Out of Food in the Last Year: Never true    [place] in the Last Year: Never true  Transportation Needs: No Transportation Needs (04/06/2018)   PRAPARE - Hydrologist (Medical): No    Lack of Transportation (Non-Medical): No  Physical Activity: Inactive (09/19/2019)   Exercise Vital Sign    Days of Exercise per Week: 0 days  Minutes of Exercise per Session: 0 min  Stress: No Stress Concern Present (09/19/2019)   [place]    Feeling of Stress : Not at all  Social Connections: Unknown (09/19/2019)   Social Connection and Isolation Panel [NHANES]    Frequency of Communication with Friends and Family: Patient refused    Frequency of Social Gatherings with Friends and Family: Patient refused    Attends Religious Services: Patient refused    Active Member of Clubs or Organizations: Patient refused    Attends Archivist Meetings: Patient refused    Marital Status: Patient refused  Intimate Partner Violence: Unknown (09/19/2019)   Humiliation, Afraid, Rape, and Kick questionnaire    Fear of Current or Ex-Partner: Patient refused    Emotionally Abused: Patient refused    Physically Abused: Patient  refused    Sexually Abused: Patient refused    FAMILY HISTORY: Family History  Problem Relation Age of Onset   Cancer Brother    Heart disease Brother    Diabetes Brother    Emphysema Brother    Prostate cancer Brother    Diabetes Sister    Emphysema Mother    Healthy Sister    Healthy Sister    Healthy Sister    Diabetes Brother    Diabetes Brother    Diabetes Brother    Healthy Brother     ALLERGIES:  is allergic to [employer] [lovastatin], niacin and related, statins, and aleve [naproxen sodium].  MEDICATIONS:  Current Outpatient Medications  Medication Sig Dispense Refill   albuterol (VENTOLIN HFA) 108 (90 Base) MCG/ACT inhaler Inhale 2 puffs into the lungs every 6 (six) hours as needed for wheezing or shortness of breath. 8 g 2   fluticasone-salmeterol (ADVAIR DISKUS) 250-50 MCG/ACT AEPB Inhale 1 puff into the lungs in the morning and at bedtime. 60 each 1   apalutamide (ERLEADA) 60 MG tablet Take 4 tablets (240 mg total) by mouth daily. 120 tablet 0   calcium-vitamin D (OSCAL WITH D) 500-5 MG-MCG tablet Take 2 tablets by mouth daily. (Patient not taking: Reported on 08/17/2022) 90 tablet 1   traMADol (ULTRAM) 50 MG tablet Take by mouth. (Patient not taking: Reported on 08/17/2022)     No current facility-administered medications for this visit.     PHYSICAL EXAMINATION: ECOG PERFORMANCE STATUS: 1 - Symptomatic but completely ambulatory Vitals:   08/17/22 1328  BP: 128/60  Pulse: 67  Resp: 19  Temp: 98.7 F (37.1 C)  SpO2: 99%   Filed Weights   08/17/22 1328  Weight: 142 lb 11.2 oz (64.7 kg)     Physical Exam Constitutional:      General: He is not in acute distress.    Comments: Patient walks independently.  HENT:     Head: Normocephalic and atraumatic.  Eyes:     General: No scleral icterus.    Conjunctiva/sclera: Conjunctivae normal.     Pupils: Pupils are equal, round, and reactive to light.  Cardiovascular:     Rate and Rhythm: Normal rate and  regular rhythm.     Heart sounds: Normal heart sounds.  Pulmonary:     Effort: Pulmonary effort is normal. No respiratory distress.     Breath sounds: No wheezing or rales.     Comments: Decreased breath sound bilaterally Chest:     Chest wall: No tenderness.  Abdominal:     General: Bowel sounds are normal. There is no distension.     Palpations: Abdomen  is soft. There is no mass.     Tenderness: There is no abdominal tenderness.  Musculoskeletal:        General: No deformity. Normal range of motion.     Cervical back: Normal range of motion and neck supple.  Lymphadenopathy:     Cervical: No cervical adenopathy.  Skin:    General: Skin is warm and dry.     Findings: No erythema or rash.  Neurological:     Mental Status: He is alert and oriented to person, place, and time. Mental status is at baseline.     Cranial Nerves: No cranial nerve deficit.     Coordination: Coordination normal.      LABORATORY DATA:  I have reviewed the data as listed Lab Results  Component Value Date   WBC 5.0 08/17/2022   HGB 12.6 (L) 08/17/2022   HCT 39.0 08/17/2022   MCV 87.6 08/17/2022   PLT 245 08/17/2022   Recent Labs    06/10/22 0954 07/26/22 1354 08/17/22 1308  NA 141 140 139  K 3.8 4.1 4.1  CL 101 102 102  CO2 32 31 32  GLUCOSE 112* 105* 106*  BUN '19 20 17  '$ CREATININE 0.98 0.96 0.86  CALCIUM 8.8* 9.0 8.9  GFRNONAA >60 >60 >60  PROT 7.1 7.4 7.1  ALBUMIN 4.0 4.0 3.9  AST '18 19 25  '$ ALT '9 9 20  '$ ALKPHOS 82 106 107  BILITOT 0.4 0.2* 0.3     RADIOGRAPHIC STUDIES: I have personally reviewed the radiological images as listed and agreed with the findings in the report. CT FOOT RIGHT WO CONTRAST  Result Date: 06/25/2022 CLINICAL DATA:  Clydene Laming chipper fell on right foot. EXAM: CT OF THE RIGHT FOOT WITHOUT CONTRAST TECHNIQUE: Multidetector CT imaging of the right foot was performed according to the standard protocol. Multiplanar CT image reconstructions were also generated.  RADIATION DOSE REDUCTION: This exam was performed according to the departmental dose-optimization program which includes automated exposure control, adjustment of the mA and/or kV according to patient size and/or use of iterative reconstruction technique. COMPARISON:  None Available. FINDINGS: Bones/Joint/Cartilage Acute comminuted depressed fracture of the calcaneus extending into the subtalar joint. Primary fracture line courses through the central portion of the posterior facet. 1-2 mm articular surface depression of the posterior facet medially. Tiny avulsion fracture of the medial tubercle of the posterior talar process (series 11, image 61). No dislocation. Joint spaces are relatively preserved. Small tibiotalar and subtalar joint effusions. Ligaments Ligaments are suboptimally evaluated by CT. Muscles and Tendons Grossly intact. Soft tissue Diffuse soft tissue swelling. No fluid collection or hematoma. No soft tissue mass. IMPRESSION: 1. Acute Sanders type 2b fracture of the calcaneus as described above. 2. Tiny avulsion fracture of the medial tubercle of the posterior talar process. Electronically Signed   By: Titus Dubin M.D.   On: 06/25/2022 17:49

## 2022-08-17 NOTE — Assessment & Plan Note (Signed)
Prostate Cancer with biochemical recurrence, asymptomatic, castration resistant nonmetastatic.  Labs reviewed and discussed with patient. Patient is currently off darolutamide due to severe fatigue. PSA is rising while off the treatments. Patient tolerates Apalutamide 240 mg daily.  Previous PSMA PET scan showed elevated SUV in the prostate/prostate bed, consider future reestablishment of Radonc for evaluation of additional radiation. Continue ADT plan to switch to Eligard '45mg'$  Q48month

## 2022-08-17 NOTE — Assessment & Plan Note (Signed)
OPD/Emphysema, recommend smoke cessation.  Patient declines pulmonology follow-ups.  He does not actively follow-up with PCP Continue albuterol as needed for wheezing/shortness of breath, Advair  twice daily-patient admits not being very compliant.  We discussed about importance of compliance of medication.

## 2022-08-19 ENCOUNTER — Other Ambulatory Visit (HOSPITAL_COMMUNITY): Payer: Self-pay

## 2022-08-20 ENCOUNTER — Other Ambulatory Visit (HOSPITAL_COMMUNITY): Payer: Self-pay

## 2022-08-25 ENCOUNTER — Telehealth: Payer: Self-pay

## 2022-08-25 NOTE — Patient Outreach (Signed)
  Care Coordination   08/25/2022 Name: Sean Wood. MRN: [id number] DOB: [birth year]   Care Coordination Outreach Attempts:  An unsuccessful telephone outreach was attempted today to offer the patient information about available care coordination services as a benefit of their health plan.   Follow Up Plan:  Additional outreach attempts will be made to offer the patient care coordination information and services.   Encounter Outcome:  No Answer  Care Coordination Interventions Activated:  No   Care Coordination Interventions:  No, not indicated    Noreene Larsson RN, MSN, CCM Community Care Coordinator [facility] Mobile: [phone]

## 2022-08-26 ENCOUNTER — Ambulatory Visit: Payer: Self-pay

## 2022-08-26 NOTE — Patient Instructions (Signed)
Visit Information  Thank you for taking time to visit with me today. Please don't hesitate to contact me if I can be of assistance to you.   Following are the goals we discussed today:   Goals Addressed             This Visit's Progress    Patient Stated       Care Coordination Interventions: Evaluation of current treatment plan related to foot injury from July of this year and patient's adherence to plan as established by provider Advised patient to call the office for changes in chronic conditions, questions or concerns Provided education to patient re: reaching out to the [facility] to assist with needs, resources, education, questions, or concerns Reviewed medications with patient and discussed compliance Collaborated with [facility] office staff regarding the need for an appointment to have a cyst on the back of the patients neck that needs intervention Reviewed scheduled/upcoming provider appointments including Assisted with an appointment to see pcp on 08-27-2022 at 120 pm, Dr. Quentin Cornwall Discussed plans with patient for ongoing care management follow up and provided patient with direct contact information for care management team Advised patient to discuss changes, questions or concerns about his health and well being with provider Screening for signs and symptoms of depression related to chronic disease state  Assessed social determinant of health barriers The patient lost his wife in April of this year. She had dementia and he was her primary caregiver. She was the "love of his life". He misses her greatly but is doing good. His adult children check on him daily.  Active listening / Reflection utilized  Emotional Support Provided  08-26-2022: The patient agrees to regular outreaches by the [facility]. Review of the goals of the program and how to reach the [facility].         Our next appointment is by telephone on 10-22-2022 at 10 am  Please call the care guide team at [phone] if you need to  cancel or reschedule your appointment.   If you are experiencing a Mental Health or [place] or need someone to talk to, please call the Suicide and Crisis Lifeline: 988 call the [place] National Suicide Prevention Lifeline: 719 175 3617 or TTY: 310-706-5459 TTY (786)440-7833) to talk to a trained counselor call 1-800-273-TALK (toll free, 24 hour hotline)  The patient verbalized understanding of instructions, educational materials, and care plan provided today and DECLINED offer to receive copy of patient instructions, educational materials, and care plan.   Telephone follow up appointment with care management team member scheduled for: 10-22-2022 at 10 am  [place], MSN, [facility] Mobile: [phone]

## 2022-08-26 NOTE — Patient Outreach (Signed)
  Care Coordination   Initial Visit Note   08/26/2022 Name: Sean Wood. MRN: [id number] DOB: 1936/11/08  Sean Wood. is a 86 y.o. year old male who sees Chrismon, Vickki Muff, [name] (Inactive) for primary care. I spoke with  Sean Wood. by phone today.  What matters to the patients health and wellness today?  "My foot getting better and I don't have to wear this boot anymore"   Goals Addressed             This Visit's Progress    Patient Stated       Care Coordination Interventions: Evaluation of current treatment plan related to foot injury from July of this year and patient's adherence to plan as established by provider Advised patient to call the office for changes in chronic conditions, questions or concerns Provided education to patient re: reaching out to the [facility] to assist with needs, resources, education, questions, or concerns Reviewed medications with patient and discussed compliance Collaborated with [facility] office staff regarding the need for an appointment to have a cyst on the back of the patients neck that needs intervention Reviewed scheduled/upcoming provider appointments including Assisted with an appointment to see pcp on 08-27-2022 at 120 pm, Dr. Quentin Cornwall Discussed plans with patient for ongoing care management follow up and provided patient with direct contact information for care management team Advised patient to discuss changes, questions or concerns about his health and well being with provider Screening for signs and symptoms of depression related to chronic disease state  Assessed social determinant of health barriers The patient lost his wife in April of this year. She had dementia and he was her primary caregiver. She was the "love of his life". He misses her greatly but is doing good. His adult children check on him daily.  Active listening / Reflection utilized  Emotional Support Provided  08-26-2022: The patient agrees to regular  outreaches by the [facility]. Review of the goals of the program and how to reach the [facility].         SDOH assessments and interventions completed:  Yes  SDOH Interventions Today    Flowsheet Row Most Recent Value  SDOH Interventions   Food Insecurity Interventions Intervention Not Indicated  Housing Interventions Intervention Not Indicated  Transportation Interventions Intervention Not Indicated  Utilities Interventions Intervention Not Indicated  Financial Strain Interventions Intervention Not Indicated        Care Coordination Interventions Activated:  Yes  Care Coordination Interventions:  Yes, provided   Follow up plan: Follow up call scheduled for 10-22-2022 at 10 am    Encounter Outcome:  Pt. Visit Completed   Noreene Larsson RN, MSN, [facility] Mobile: [phone]

## 2022-08-27 ENCOUNTER — Encounter: Payer: Self-pay | Admitting: Family Medicine

## 2022-08-27 ENCOUNTER — Ambulatory Visit: Payer: PPO | Admitting: Family Medicine

## 2022-08-27 VITALS — BP 108/69 | HR 76 | Temp 98.4°F | Resp 16 | Wt 142.0 lb

## 2022-08-27 DIAGNOSIS — L729 Follicular cyst of the skin and subcutaneous tissue, unspecified: Secondary | ICD-10-CM

## 2022-08-27 DIAGNOSIS — Z23 Encounter for immunization: Secondary | ICD-10-CM

## 2022-08-27 NOTE — Progress Notes (Signed)
   I,Sulibeya S Dimas,acting as a Education administrator for [employer], MD.,have documented all relevant documentation on the behalf of Eulis Foster, MD,as directed by  Eulis Foster, MD while in the presence of Eulis Foster, MD.     Established patient visit   Patient: Sean Wood.   DOB: 03/17/1936   86 y.o. Male  MRN: [id number] Visit Date: 08/27/2022  Today's healthcare provider: Eulis Foster, MD   Chief Complaint  Patient presents with  . Cyst   Subjective    Cyst Patient complains of a subcutaneous nodule located over the neck.  This has been present for several years.  There has been no associated symptoms of discharge, tenderness, sudden swelling, or pain.  Patient does not have a history of epidermal inclusion cysts.  Medications: Outpatient Medications Prior to Visit  Medication Sig  . albuterol (VENTOLIN HFA) 108 (90 Base) MCG/ACT inhaler Inhale 2 puffs into the lungs every 6 (six) hours as needed for wheezing or shortness of breath.  Marland Kitchen apalutamide (ERLEADA) 60 MG tablet Take 4 tablets (240 mg total) by mouth daily.  Marland Kitchen aspirin EC 81 MG tablet Take 81 mg by mouth daily. Swallow whole.  . calcium-vitamin D (OSCAL WITH D) 500-5 MG-MCG tablet Take 2 tablets by mouth daily.  . fluticasone-salmeterol (ADVAIR DISKUS) 250-50 MCG/ACT AEPB Inhale 1 puff into the lungs in the morning and at bedtime.  . traMADol (ULTRAM) 50 MG tablet Take by mouth.   No facility-administered medications prior to visit.    Review of Systems  Constitutional:  Negative for appetite change and fatigue.  Respiratory:  Negative for chest tightness and shortness of breath.   Cardiovascular:  Negative for chest pain.  Psychiatric/Behavioral:  Positive for sleep disturbance.       Objective    BP 108/69 (BP Location: Left Arm, Patient Position: Sitting, Cuff Size: Normal)   Pulse 76   Temp 98.4 F (36.9 C) (Oral)   Resp 16   Wt 142 lb (64.4  kg)   BMI 20.97 kg/m    Physical Exam Skin:    General: Skin is warm and dry.     Findings: Lesion present. No erythema or rash.    ***  No results found for any visits on 08/27/22.  Assessment & Plan     ***  No follow-ups on file.      {provider attestation***:1}   Eulis Foster, MD  [facility] [phone] (phone) [phone] (fax)  [place]

## 2022-08-29 DIAGNOSIS — L729 Follicular cyst of the skin and subcutaneous tissue, unspecified: Secondary | ICD-10-CM | POA: Insufficient documentation

## 2022-08-29 NOTE — Assessment & Plan Note (Signed)
Chronic  Asymptomatic for ~30 years, no bleeding or drainage, no overlying erythema  Nodule is well circumscribed, soft and mobile  Patient prefers to have cyst removed  Referral to Gen surgery for removal evaluation

## 2022-09-07 ENCOUNTER — Ambulatory Visit: Payer: PPO | Admitting: Surgery

## 2022-09-07 VITALS — BP 135/77 | HR 72 | Temp 98.0°F | Ht 70.0 in | Wt 142.8 lb

## 2022-09-07 DIAGNOSIS — L729 Follicular cyst of the skin and subcutaneous tissue, unspecified: Secondary | ICD-10-CM

## 2022-09-07 NOTE — Patient Instructions (Signed)
If you have any concerns or questions, please feel free to call our office.    Epidermoid Cyst  An epidermoid cyst, also called an epidermal cyst, is a small lump under your skin. The cyst contains a substance called keratin. Do not try to pop or open the cyst yourself. What are the causes? A blocked hair follicle. A hair that curls and re-enters the skin instead of growing straight out of the skin. A blocked pore. Irritated skin. An injury to the skin. Certain conditions that are passed along from parent to child. Human papillomavirus (HPV). This happens rarely when cysts occur on the bottom of the feet. Long-term sun damage to the skin. What increases the risk? Having acne. Being male. Having an injury to the skin. Being past puberty. Having certain conditions caused by genes (genetic disorder) What are the signs or symptoms? These cysts are usually harmless, but they can get infected. Symptoms of infection may include: Redness. Inflammation. Tenderness. Warmth. Fever. A bad-smelling substance that drains from the cyst. Pus that drains from the cyst. How is this treated? In many cases, epidermoid cysts go away on their own without treatment. If a cyst becomes infected, treatment may include: Opening and draining the cyst, done by a doctor. After draining, you may need minor surgery to remove the rest of the cyst. Antibiotic medicine. Shots of medicines (steroids) that help to reduce inflammation. Surgery to remove the cyst. Surgery may be done if the cyst: Becomes large. Bothers you. Has a chance of turning into cancer. Do not try to open a cyst yourself. Follow these instructions at home: Medicines Take over-the-counter and prescription medicines as told by your doctor. If you were prescribed an antibiotic medicine, take it as told by your doctor. Do not stop taking it even if you start to feel better. General instructions Keep the area around your cyst clean and  dry. Wear loose, dry clothing. Avoid touching your cyst. Check your cyst every day for signs of infection. Check for: Redness, swelling, or pain. Fluid or blood. Warmth. Pus or a bad smell. Keep all follow-up visits. How is this prevented? Wear clean, dry, clothing. Avoid wearing tight clothing. Keep your skin clean and dry. Take showers or baths every day. Contact a doctor if: Your cyst has symptoms of infection. Your condition does not improve or gets worse. You have a cyst that looks different from other cysts you have had. You have a fever. Get help right away if: Redness spreads from the cyst into the area close by. Summary An epidermoid cyst is a small lump under your skin. If a cyst becomes infected, treatment may include surgery to open and drain the cyst, or to remove it. Take over-the-counter and prescription medicines only as told by your doctor. Contact a doctor if your condition is not improving or is getting worse. Keep all follow-up visits. This information is not intended to replace advice given to you by your health care provider. Make sure you discuss any questions you have with your health care provider. Document Revised: 03/12/2020 Document Reviewed: 03/12/2020 Elsevier Patient Education  2023 Elsevier Inc.  

## 2022-09-07 NOTE — Progress Notes (Signed)
Patient ID: Sean Fitz., male   DOB: [birth year], 86 y.o.   MRN: [id number]  Chief Complaint: Dermal cyst at vertebral prominence  History of Present Illness Sean Wood. is a 86 y.o. male with 10-year history of the presence of a cyst.  He reports mashing it from time to time and getting some discharge from it.  He reports he has had treatments for his prostate cancer, which he believes has thickened his skin.  Had any recent cyst discharge, no drainage, just a mild soreness.  He is uncertain if he wants to proceed with excision.  He denies any fevers or chills.  Reports the cyst is slightly bigger, but that may be thickening of his skin which he believes has occurred since his initiation of treatment.  Past Medical History Past Medical History:  Diagnosis Date   Arthritis    COPD (chronic obstructive pulmonary disease) (HCC)    Elevated PSA    Hematuria, gross    History of hiatal hernia    HOH (hard of hearing)    Kidney stone    Prostate cancer ([place])    Shortness of breath dyspnea       Past Surgical History:  Procedure Laterality Date   [place]  06/24/2015   Procedure: CYSTOSCOPY;  Surgeon: Hollice Espy, MD;  Location: [facility] OR[facility];  Service: Urology;;   HERNIA REPAIR     left and right inguinal hernia repair   RADIOACTIVE SEED IMPLANT N/A 06/24/2015   Procedure: RADIOACTIVE SEED IMPLANT/BRACHYTHERAPY IMPLANT;  Surgeon: Hollice Espy, MD;  Location: [facility] OR[facility];  Service: Urology;  Laterality: N/A;    Allergies  Allergen Reactions   Mevacor [Lovastatin] Other (See Comments)    "feel washed out"   Niacin And Related Other (See Comments)    "hot feeling"   Statins Other (See Comments)    "feel washed out"   Aleve [Naproxen Sodium] Rash    Current Outpatient Medications  Medication Sig Dispense Refill   albuterol (VENTOLIN HFA) 108 (90 Base) MCG/ACT inhaler Inhale 2 puffs into the lungs every 6 (six) hours as needed for wheezing or  shortness of breath. 8 g 2   apalutamide (ERLEADA) 60 MG tablet Take 4 tablets (240 mg total) by mouth daily. 120 tablet 0   aspirin EC 81 MG tablet Take 81 mg by mouth daily. Swallow whole.     calcium-vitamin D (OSCAL WITH D) 500-5 MG-MCG tablet Take 2 tablets by mouth daily. 90 tablet 1   fluticasone-salmeterol (ADVAIR DISKUS) 250-50 MCG/ACT AEPB Inhale 1 puff into the lungs in the morning and at bedtime. 60 each 1   traMADol (ULTRAM) 50 MG tablet Take by mouth.     No current facility-administered medications for this visit.    Family History Family History  Problem Relation Age of Onset   Cancer Brother    Heart disease Brother    Diabetes Brother    Emphysema Brother    Prostate cancer Brother    Diabetes Sister    Emphysema Mother    Healthy Sister    Healthy Sister    Healthy Sister    Diabetes Brother    Diabetes Brother    Diabetes Brother    Healthy Brother       Social History Social History   Tobacco Use   Smoking status: Former    Packs/day: 1.50    Types: Cigarettes    Quit date: 05/24/1981    Years  since quitting: 41.3   Smokeless tobacco: Current    Types: Chew  Vaping Use   Vaping Use: Never used  Substance Use Topics   Alcohol use: Not Currently    Alcohol/week: 0.0 standard drinks of alcohol   Drug use: No        Review of Systems  Constitutional:  Positive for weight loss.  HENT:  Positive for hearing loss.   Eyes:  Positive for blurred vision and double vision.  Respiratory:  Positive for shortness of breath.   Cardiovascular:  Positive for leg swelling.  Gastrointestinal: Negative.   Genitourinary: Negative.   Skin: Negative.   Neurological: Negative.   Psychiatric/Behavioral: Negative.        Physical Exam There were no vitals taken for this visit.   CONSTITUTIONAL: Well developed, and nourished, appropriately responsive and aware without distress.   EYES: Sclera non-icteric.   EARS, NOSE, MOUTH AND THROAT:  The oropharynx  is clear. Oral mucosa is pink and moist.     Hearing is intact to voice.  NECK: Trachea is midline, and there is no jugular venous distension.  LYMPH NODES:  Lymph nodes in the neck are not enlarged. RESPIRATORY:  Lungs are clear, and breath sounds are equal bilaterally. Normal respiratory effort without pathologic use of accessory muscles. CARDIOVASCULAR: Heart is regular in rate and rhythm. GI: The abdomen is soft, nontender, and nondistended.  MUSCULOSKELETAL:  Symmetrical muscle tone appreciated in all four extremities.    SKIN: Skin turgor is normal. No pathologic skin lesions appreciated.  Over his vertebral prominence there is a slightly raised area consistent with a dermal cyst lesion.  There is no fluctuance, no appreciable punctate, no evidence of drainage.  No appreciable induration or tenderness. NEUROLOGIC:  Motor and sensation appear grossly normal.  Cranial nerves are grossly without defect. PSYCH:  Alert and oriented to person, place and time. Affect is appropriate for situation.  Data Reviewed I have personally reviewed what is currently available of the patient's imaging, recent labs and medical records.   Labs:     Latest Ref Rng & Units 08/17/2022    1:08 PM 07/26/2022    1:54 PM 06/10/2022    9:54 AM  CBC  WBC 4.0 - 10.5 K/uL 5.0  5.6  4.4   Hemoglobin 13.0 - 17.0 g/dL 12.6  13.4  13.4   Hematocrit 39.0 - 52.0 % 39.0  41.6  42.2   Platelets 150 - 400 K/uL 245  233  197       Latest Ref Rng & Units 08/17/2022    1:08 PM 07/26/2022    1:54 PM 06/10/2022    9:54 AM  CMP  Glucose 70 - 99 mg/dL 106  105  112   BUN 8 - 23 mg/dL '17  20  19   '$ Creatinine 0.61 - 1.24 mg/dL 0.86  0.96  0.98   Sodium 135 - 145 mmol/L 139  140  141   Potassium 3.5 - 5.1 mmol/L 4.1  4.1  3.8   Chloride 98 - 111 mmol/L 102  102  101   CO2 22 - 32 mmol/L 32  31  32   Calcium 8.9 - 10.3 mg/dL 8.9  9.0  8.8   Total Protein 6.5 - 8.1 g/dL 7.1  7.4  7.1   Total Bilirubin 0.3 - 1.2 mg/dL 0.3  0.2   0.4   Alkaline Phos 38 - 126 U/L 107  106  82   AST 15 - 41 U/L  $'25  19  18   'T$ ALT 0 - 44 U/L '20  9  9       '$ Imaging:  Within last 24 hrs: No results found.  Assessment    Dermal/inclusion cyst of the vertebral prominence area, approximately less than 2 cm. 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 He is a bit undecided in terms of pursuing excision at this present time.  Advised that we can always excise it later if he wants to come back and have it completed.  Remain readily available should this become more problematic for him.  Face-to-face time spent with the patient and accompanying care providers(if present) was 30 minutes, with more than 50% of the time spent counseling, educating, and coordinating care of the patient.    These notes generated with voice recognition software. I apologize for typographical errors.  Ronny Bacon M.D., FACS 09/07/2022, 10:13 AM

## 2022-09-14 DIAGNOSIS — S92011D Displaced fracture of body of right calcaneus, subsequent encounter for fracture with routine healing: Secondary | ICD-10-CM | POA: Diagnosis not present

## 2022-09-15 ENCOUNTER — Other Ambulatory Visit (HOSPITAL_COMMUNITY): Payer: Self-pay

## 2022-09-17 ENCOUNTER — Other Ambulatory Visit: Payer: PPO

## 2022-09-20 ENCOUNTER — Other Ambulatory Visit (HOSPITAL_COMMUNITY): Payer: Self-pay

## 2022-09-20 ENCOUNTER — Other Ambulatory Visit: Payer: Self-pay | Admitting: Oncology

## 2022-09-20 DIAGNOSIS — C61 Malignant neoplasm of prostate: Secondary | ICD-10-CM

## 2022-09-20 NOTE — Telephone Encounter (Signed)
CBC with Differential/Platelet Order: [id number] Status: Final result    Visible to patient: No (inaccessible in MyChart)    Next appt: 09/21/2022 at 12:45 PM in Oncology ([facility])    Dx: Malignant neoplasm of prostate ([place])    0 Result Notes           Component Ref Range & Units 1 mo ago (08/17/22) 1 mo ago (07/26/22) 3 mo ago (06/10/22) 5 mo ago (04/13/22) 7 mo ago (02/09/22) 8 mo ago (01/01/22) 9 mo ago (12/04/21)  WBC 4.0 - 10.5 K/uL 5.0  5.6  4.4  3.3 Low   4.2  3.4 Low   5.1   RBC 4.22 - 5.81 MIL/uL 4.45  4.68  4.72  4.70  4.55  4.56  4.48   Hemoglobin 13.0 - 17.0 g/dL 12.6 Low   13.4  13.4  13.3  12.9 Low   12.8 Low   12.5 Low    HCT 39.0 - 52.0 % 39.0  41.6  42.2  41.5  40.1  39.9  39.5   MCV 80.0 - 100.0 fL 87.6  88.9  89.4  88.3  88.1  87.5  88.2   MCH 26.0 - 34.0 pg 28.3  28.6  28.4  28.3  28.4  28.1  27.9   MCHC 30.0 - 36.0 g/dL 32.3  32.2  31.8  32.0  32.2  32.1  31.6   RDW 11.5 - 15.5 % 13.6  13.7  13.5  13.5  14.0  14.1  13.9   Platelets 150 - 400 K/uL 245  233  197  155  164  150  278   nRBC 0.0 - 0.2 % 0.0  0.0  0.0  0.0  0.0  0.0  0.0   Neutrophils Relative % % 36  46  41  32  37  38  34   Neutro Abs 1.7 - 7.7 K/uL 1.8  2.6  1.9  1.1 Low   1.6 Low   1.3 Low   1.7   Lymphocytes Relative % 47  36  39  48  46  45  45   Lymphs Abs 0.7 - 4.0 K/uL 2.3  2.0  1.7  1.6  1.9  1.5  2.3   Monocytes Relative % '17  16  18  18  16  16  20   '$ Monocytes Absolute 0.1 - 1.0 K/uL 0.9  0.9  0.8  0.6  0.7  0.5  1.0   Eosinophils Relative % 0  '1  1  1  '$ 0  0  0   Eosinophils Absolute 0.0 - 0.5 K/uL 0.0  0.0  0.0  0.0  0.0  0.0  0.0   Basophils Relative % 0  0  0  0  0  0  0   Basophils Absolute 0.0 - 0.1 K/uL 0.0  0.0  0.0  0.0  0.0  0.0  0.0   Immature Granulocytes % 0  '1  1  1  1  1  1   '$ Abs Immature Granulocytes 0.00 - 0.07 K/uL 0.02  0.04 CM  0.02 CM  0.03 CM  0.03 CM  0.02 CM  0.05 CM   Comment: Performed at [facility], [place] [address] [place], [place] [address]  Resulting  Agency  [facility] [place] [facility] [place] [facility] [place] [facility] [place] [facility] [place] [facility] [place] [facility]         Specimen Collected: 08/17/22  13:08 Last Resulted: 08/17/22 13:18      Lab Flowsheet    Order Details    View Encounter    Lab and Collection Details    Routing    Result History    View All Conversations on this Encounter      CM=Additional comments      Result Care Coordination   Patient Communication   Add Comments   Not seen Back to Top       Other Results from 08/17/2022  PSA Order: [id number] Status: Final result    Visible to patient: No (inaccessible in MyChart)    Next appt: 09/21/2022 at 12:45 PM in Oncology ([facility])    Dx: Malignant neoplasm of prostate ([place])    0 Result Notes           Component Ref Range & Units 1 mo ago (08/17/22) 1 mo ago (07/26/22) 3 mo ago (06/10/22) 5 mo ago (04/13/22) 7 mo ago (02/09/22) 8 mo ago (01/01/22) 9 mo ago (12/04/21)  Prostatic Specific Antigen 0.00 - 4.00 ng/mL 1.03  3.02 CM  0.60 CM  0.88 CM  1.19 CM  1.80 CM  2.32 CM   Comment: (NOTE)  While PSA levels of <=4.0 ng/ml are reported as reference range, some  men with levels below 4.0 ng/ml can have prostate cancer and many men  with PSA above 4.0 ng/ml do not have prostate cancer.  Other tests  such as free PSA, age specific reference ranges, PSA velocity and PSA  doubling time may be helpful especially in men less than 80 years  old.  Performed at [facility], [place] [address] [place], [place]  [address]   Resulting Agency  [place] [facility] [place] [facility] [place] [facility] [place] [facility] [place] [facility] [place] [facility] [place] [facility]         Specimen Collected: 08/17/22 13:08 Last Resulted: 08/17/22 23:17      Lab Flowsheet    Order Details    View Encounter    Lab and Collection Details    Routing    Result History    View All Conversations on this Encounter      CM=Additional comments      Result Care Coordination   Patient Communication   Add Comments   Not seen Back to  Top          Contains abnormal data Comprehensive metabolic panel Order: [id number] Status: Final result    Visible to patient: No (inaccessible in MyChart)    Next appt: 09/21/2022 at 12:45 PM in Oncology ([facility])    Dx: Malignant neoplasm of prostate ([place])    0 Result Notes           Component Ref Range & Units 1 mo ago (08/17/22) 1 mo ago (07/26/22) 3 mo ago (06/10/22) 5 mo ago (04/13/22) 7 mo ago (02/09/22) 8 mo ago (01/01/22) 9 mo ago (12/04/21)  Sodium 135 - 145 mmol/L 139  140  141  139  135  138  140   Potassium 3.5 - 5.1 mmol/L 4.1  4.1  3.8  3.5  4.0  4.3  4.0   Chloride 98 - 111 mmol/L 102  102  101  102  97 Low   101  103   CO2 22 - 32 mmol/L 32  31  32  32  31  32  29   Glucose, Bld 70 - 99 mg/dL 106 High   105  High  CM  112 High  CM  106 High  CM  102 High  CM  112 High  CM  106 High  CM   Comment: Glucose reference range applies only to samples taken after fasting for at least 8 hours.  BUN 8 - 23 mg/dL '17  20  19  16  19  21  16   '$ Creatinine, Ser 0.61 - 1.24 mg/dL 0.86  0.96  0.98  0.89  0.79  0.84  0.82   Calcium 8.9 - 10.3 mg/dL 8.9  9.0  8.8 Low   8.8 Low   9.0  9.2  8.6 Low    Total Protein 6.5 - 8.1 g/dL 7.1  7.4  7.1  6.8  7.1  7.2  7.3   Albumin 3.5 - 5.0 g/dL 3.9  4.0  4.0  3.9  4.2  4.3  4.0   AST 15 - 41 U/L '25  19  18  16  17  18  16   '$ ALT 0 - 44 U/L '20  9  9  9  9  8  7   '$ Alkaline Phosphatase 38 - 126 U/L 107  106  82  56  59  66  74   Total Bilirubin 0.3 - 1.2 mg/dL 0.3  0.2 Low   0.4  0.3  0.5  0.6  0.5   GFR, Estimated >60 mL/min >60  >60 CM  >60 CM  >60 CM  >60 CM  >60 CM  >60 CM   Comment: (NOTE)  Calculated using the CKD-EPI Creatinine Equation (2021)   Anion gap 5 - '15 5  7 '$ CM  8 CM  5 CM  7 CM  5 CM  8 CM   Comment: Performed at [facility], [address] [place], [place] [address]  Resulting Agency  [facility] [place] [facility] [place] [facility] [place] [facility] [place] [facility] [place] [facility] [place] [facility]         Specimen Collected: 08/17/22 13:08  Last Resulted: 08/17/22 13:32

## 2022-09-21 ENCOUNTER — Inpatient Hospital Stay (HOSPITAL_BASED_OUTPATIENT_CLINIC_OR_DEPARTMENT_OTHER): Payer: PPO | Admitting: Oncology

## 2022-09-21 ENCOUNTER — Other Ambulatory Visit (HOSPITAL_COMMUNITY): Payer: Self-pay

## 2022-09-21 ENCOUNTER — Encounter: Payer: Self-pay | Admitting: Oncology

## 2022-09-21 ENCOUNTER — Inpatient Hospital Stay: Payer: PPO | Attending: Oncology

## 2022-09-21 VITALS — BP 130/70 | HR 70 | Resp 18 | Ht 70.0 in | Wt 145.0 lb

## 2022-09-21 DIAGNOSIS — J439 Emphysema, unspecified: Secondary | ICD-10-CM | POA: Diagnosis not present

## 2022-09-21 DIAGNOSIS — Z5111 Encounter for antineoplastic chemotherapy: Secondary | ICD-10-CM | POA: Diagnosis not present

## 2022-09-21 DIAGNOSIS — C61 Malignant neoplasm of prostate: Secondary | ICD-10-CM | POA: Diagnosis not present

## 2022-09-21 DIAGNOSIS — J438 Other emphysema: Secondary | ICD-10-CM | POA: Diagnosis not present

## 2022-09-21 DIAGNOSIS — Z8042 Family history of malignant neoplasm of prostate: Secondary | ICD-10-CM | POA: Insufficient documentation

## 2022-09-21 DIAGNOSIS — R5383 Other fatigue: Secondary | ICD-10-CM | POA: Insufficient documentation

## 2022-09-21 DIAGNOSIS — Z79899 Other long term (current) drug therapy: Secondary | ICD-10-CM | POA: Diagnosis not present

## 2022-09-21 DIAGNOSIS — Z87891 Personal history of nicotine dependence: Secondary | ICD-10-CM | POA: Insufficient documentation

## 2022-09-21 DIAGNOSIS — J449 Chronic obstructive pulmonary disease, unspecified: Secondary | ICD-10-CM | POA: Insufficient documentation

## 2022-09-21 LAB — CBC WITH DIFFERENTIAL/PLATELET
Abs Immature Granulocytes: 0.02 10*3/uL (ref 0.00–0.07)
Basophils Absolute: 0 10*3/uL (ref 0.0–0.1)
Basophils Relative: 0 %
Eosinophils Absolute: 0 10*3/uL (ref 0.0–0.5)
Eosinophils Relative: 1 %
HCT: 39.8 % (ref 39.0–52.0)
Hemoglobin: 12.8 g/dL — ABNORMAL LOW (ref 13.0–17.0)
Immature Granulocytes: 1 %
Lymphocytes Relative: 40 %
Lymphs Abs: 1.7 10*3/uL (ref 0.7–4.0)
MCH: 27.9 pg (ref 26.0–34.0)
MCHC: 32.2 g/dL (ref 30.0–36.0)
MCV: 86.7 fL (ref 80.0–100.0)
Monocytes Absolute: 0.8 10*3/uL (ref 0.1–1.0)
Monocytes Relative: 18 %
Neutro Abs: 1.7 10*3/uL (ref 1.7–7.7)
Neutrophils Relative %: 40 %
Platelets: 215 10*3/uL (ref 150–400)
RBC: 4.59 MIL/uL (ref 4.22–5.81)
RDW: 13.7 % (ref 11.5–15.5)
WBC: 4.3 10*3/uL (ref 4.0–10.5)
nRBC: 0 % (ref 0.0–0.2)

## 2022-09-21 LAB — COMPREHENSIVE METABOLIC PANEL
ALT: 7 U/L (ref 0–44)
AST: 17 U/L (ref 15–41)
Albumin: 3.8 g/dL (ref 3.5–5.0)
Alkaline Phosphatase: 108 U/L (ref 38–126)
Anion gap: 6 (ref 5–15)
BUN: 16 mg/dL (ref 8–23)
CO2: 31 mmol/L (ref 22–32)
Calcium: 8.9 mg/dL (ref 8.9–10.3)
Chloride: 104 mmol/L (ref 98–111)
Creatinine, Ser: 0.72 mg/dL (ref 0.61–1.24)
GFR, Estimated: >60 mL/min (ref >60)
Glucose, Bld: 146 mg/dL — ABNORMAL HIGH (ref 70–99)
Potassium: 3.6 mmol/L (ref 3.5–5.1)
Sodium: 141 mmol/L (ref 135–145)
Total Bilirubin: 0.2 mg/dL — ABNORMAL LOW (ref 0.3–1.2)
Total Protein: 7.2 g/dL (ref 6.5–8.1)

## 2022-09-21 LAB — PSA: Prostatic Specific Antigen: 0.8 ng/mL (ref 0.00–4.00)

## 2022-09-21 MED ORDER — APALUTAMIDE 60 MG PO TABS
180.0000 mg | ORAL_TABLET | Freq: Every day | ORAL | 0 refills | Status: DC
  Filled 2022-09-21: qty 90, 30d supply, fill #0

## 2022-09-21 NOTE — Assessment & Plan Note (Signed)
Prostate Cancer with biochemical recurrence, asymptomatic, castration resistant nonmetastatic.  Labs reviewed and discussed with patient. He did not tolerate Apalutamide 240 mg daily due to severe fatigue.  I recommend him to stop for one week and resume on Apalutamide '120mg'$  daily. He may titrate up to '180mg'$  daily if tolerable.   Previous PSMA PET scan showed elevated SUV in the prostate/prostate bed, consider future reestablishment of Radonc for evaluation of additional radiation. Continue ADT plan to switch to Eligard '45mg'$  Q6 months - December 2023

## 2022-09-21 NOTE — Assessment & Plan Note (Signed)
COPD/Emphysema, recommend smoke cessation.  Patient declines pulmonology follow-ups.  He does not actively follow-up with PCP Continue albuterol as needed for wheezing/shortness of breath, Advair  twice daily-patient admits not being very compliant.

## 2022-09-21 NOTE — Progress Notes (Signed)
Hematology/Oncology Progress note [phone] [phone]      Patient Care Team: Chrismon, Vickki Muff, [name] (Inactive) as PCP - General (Physician Assistant) Garrel Ridgel, DPM as Consulting Physician (Podiatry) Noreene Filbert, MD as Referring Physician (Radiation Oncology) Idelle Leech, OD as Consulting Physician (Optometry) Weber Server, MD as Consulting Physician (Oncology) Vanita Ingles, RN as Case Manager (General Practice)   ASSESSMENT & PLAN:   Cancer Staging  No matching staging information was found for the patient.  Malignant neoplasm of prostate [facility] Prostate Cancer with biochemical recurrence, asymptomatic, castration resistant nonmetastatic.  Labs reviewed and discussed with patient. He did not tolerate Apalutamide 240 mg daily due to severe fatigue.  I recommend him to stop for one week and resume on Apalutamide '120mg'$  daily. He may titrate up to '180mg'$  daily if tolerable.   Previous PSMA PET scan showed elevated SUV in the prostate/prostate bed, consider future reestablishment of Radonc for evaluation of additional radiation. Continue ADT plan to switch to Eligard '45mg'$  Q6 months - December 2023  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HCC) COPD/Emphysema, recommend smoke cessation.  Patient declines pulmonology follow-ups.  He does not actively follow-up with PCP Continue albuterol as needed for wheezing/shortness of breath, Advair  twice daily-patient admits not being very compliant.  Other fatigue multifactorial Recommend empiric B12 and Vitamin D supplementation.  Check levels at next visit.   Orders Placed This Encounter  Procedures   CBC with Differential/Platelet    Standing Status:   Future    Standing Expiration Date:   09/22/2023   Comprehensive metabolic panel    Standing Status:   Future    Standing Expiration Date:   09/21/2023   PSA    Standing Status:   Future    Standing Expiration Date:   09/22/2023    Follow up  1  month  All questions were answered. The patient knows to call the clinic with any problems, questions or concerns.  Yoshio Server, MD, PhD [facility] Hematology Oncology 09/21/2022    HISTORY OF PRESENTING ILLNESS:  Sean Wood. is a  86 y.o.  male with PMH listed below who was referred to me for evaluation of prostate cancer.   Cancer history dated back to 2009 when He was diagnosed with intermediate risk, Gleason 3+4, T1c prostate cancer in 2009. PSA at diagnosis was 6.1. IMRT was completed in August 2009. PSA nadir was 0.5. His PSA January 2013 was 0.8 and in January 2014 was 1.3. This was repeated in May 2014 and had increased to 1.8. He elected a repeat prostate biopsy in November 2014 after his PSA in October had increased to 2.2. This showed a focus of Gleason 3+3 adenocarcinoma from the right prostate involving less than 5%. He elected surveillance after discussing options. In May 2016, he was evaluated by Dr.Chrystal and he received salvage seed implantation with I-125. He subsequently follows with Dr.Chrystal for Lupron every 4 months since then. Achieve PSA nadir of 0.06 in 09/2016, and started to trend up and most recently trended up to 9.64.   # Lupron 30 mg on 05/10/2018, 06/14/2018 testosterone level was at 6  # Leupron 22.'5mg'$  11/06/2018.  Delayed as patient reports feeling very fatigued and we discussed about holding Lupron.  11-monthand restarted on 11/06/2018.  10/06/2021, PSMA PET scan showed marked radiotracer accumulation with elevated SUV in the prostate/prostate bed tracking towards the bladder base with distortion of the urinary bladder.  Signs of brachytherapy in the prostate as well.  No definitive signs of extraprostatic disease.  A single lymph node in the left the pelvis with only minimally elevated SUV.  Equivocal.  Aortic atherosclerosis.  10/20/2021 Darolutamide '600mg'$  BID 01/01/2022, decreased to Darolutamide '300mg'$  BID 06/10/2022 Darolutamide '300mg'$  BID  held 07/27/2022, started on apalutamide 240 mg daily. INTERVAL HISTORY Sean Wood. is a 86 y.o. male who has above history reviewed by me present for follow-up of management of biochemical recurrence of prostate cancer Patient takes apalutamide 240 mg daily, fatigue is worse.     Review of Systems  Constitutional:  Positive for malaise/fatigue. Negative for chills, fever and weight loss.  HENT:  Negative for sore throat.   Eyes:  Negative for redness.  Respiratory:  Positive for shortness of breath. Negative for cough and wheezing.   Cardiovascular:  Negative for chest pain, palpitations and leg swelling.  Gastrointestinal:  Negative for abdominal pain, blood in stool, nausea and vomiting.  Genitourinary:  Negative for dysuria.  Musculoskeletal:  Negative for myalgias.  Skin:  Negative for rash.  Neurological:  Negative for dizziness, tingling and tremors.  Endo/Heme/Allergies:  Does not bruise/bleed easily.  Psychiatric/Behavioral:  Negative for hallucinations.     MEDICAL HISTORY:  Past Medical History:  Diagnosis Date   Arthritis    COPD (chronic obstructive pulmonary disease) (HCC)    Elevated PSA    Hematuria, gross    History of hiatal hernia    HOH (hard of hearing)    Kidney stone    Prostate cancer ([place])    Shortness of breath dyspnea     SURGICAL HISTORY: Past Surgical History:  Procedure Laterality Date   [place]  06/24/2015   Procedure: CYSTOSCOPY;  Surgeon: Hollice Espy, MD;  Location: [facility] ORS;  Service: Urology;;   HERNIA REPAIR     left and right inguinal hernia repair   RADIOACTIVE SEED IMPLANT N/A 06/24/2015   Procedure: RADIOACTIVE SEED IMPLANT/BRACHYTHERAPY IMPLANT;  Surgeon: Hollice Espy, MD;  Location: [facility] ORS;  Service: Urology;  Laterality: N/A;    SOCIAL HISTORY: Social History   Socioeconomic History   Marital status: Widowed    Spouse name: Not on file   Number of children: 1   Years of education:  Not on file   Highest education level: 8th grade  Occupational History   Occupation: retired  Tobacco Use   Smoking status: Former    Packs/day: 1.50    Types: Cigarettes    Quit date: 05/24/1981    Years since quitting: 41.3   Smokeless tobacco: Current    Types: Chew  Vaping Use   Vaping Use: Never used  Substance and Sexual Activity   Alcohol use: Not Currently    Alcohol/week: 0.0 standard drinks of alcohol   Drug use: No   Sexual activity: Not on file  Other Topics Concern   Not on file  Social History Narrative   Not on file   Social Determinants of Health   Financial Resource Strain: Low Risk  (08/26/2022)   Overall Financial Resource Strain (CARDIA)    Difficulty of Paying Living Expenses: Not hard at all  Food Insecurity: No Food Insecurity (08/26/2022)   Hunger Vital Sign    Worried About Running Out of Food in the Last Year: Never true    [place] in the Last Year: Never true  Transportation Needs: No Transportation Needs (08/26/2022)   PRAPARE - Hydrologist (Medical): No  Lack of Transportation (Non-Medical): No  Physical Activity: Inactive (09/19/2019)   Exercise Vital Sign    Days of Exercise per Week: 0 days    Minutes of Exercise per Session: 0 min  Stress: No Stress Concern Present (09/19/2019)   [place]    Feeling of Stress : Not at all  Social Connections: Socially Isolated (08/26/2022)   Social Connection and Isolation Panel [NHANES]    Frequency of Communication with Friends and Family: More than three times a week    Frequency of Social Gatherings with Friends and Family: More than three times a week    Attends Religious Services: Never    Marine scientist or Organizations: No    Attends Archivist Meetings: Never    Marital Status: Widowed  Intimate Partner Violence: Not At Risk (08/26/2022)   Humiliation, Afraid, Rape, and Kick  questionnaire    Fear of Current or Ex-Partner: No    Emotionally Abused: No    Physically Abused: No    Sexually Abused: No    FAMILY HISTORY: Family History  Problem Relation Age of Onset   Cancer Brother    Heart disease Brother    Diabetes Brother    Emphysema Brother    Prostate cancer Brother    Diabetes Sister    Emphysema Mother    Healthy Sister    Healthy Sister    Healthy Sister    Diabetes Brother    Diabetes Brother    Diabetes Brother    Healthy Brother     ALLERGIES:  is allergic to [employer] [lovastatin], niacin and related, statins, and aleve [naproxen sodium].  MEDICATIONS:  Current Outpatient Medications  Medication Sig Dispense Refill   albuterol (VENTOLIN HFA) 108 (90 Base) MCG/ACT inhaler Inhale 2 puffs into the lungs every 6 (six) hours as needed for wheezing or shortness of breath. 8 g 2   apalutamide (ERLEADA) 60 MG tablet Take 4 tablets (240 mg total) by mouth daily. 120 tablet 0   aspirin EC 81 MG tablet Take 81 mg by mouth daily. Swallow whole.     fluticasone-salmeterol (ADVAIR DISKUS) 250-50 MCG/ACT AEPB Inhale 1 puff into the lungs in the morning and at bedtime. 60 each 1   calcium-vitamin D (OSCAL WITH D) 500-5 MG-MCG tablet Take 2 tablets by mouth daily. (Patient not taking: Reported on 09/21/2022) 90 tablet 1   No current facility-administered medications for this visit.     PHYSICAL EXAMINATION: ECOG PERFORMANCE STATUS: 1 - Symptomatic but completely ambulatory Vitals:   09/21/22 1310  BP: 130/70  Pulse: 70  Resp: 18  SpO2: 98%   Filed Weights   09/21/22 1304  Weight: 145 lb (65.8 kg)     Physical Exam Constitutional:      General: He is not in acute distress.    Comments: Patient walks independently.  HENT:     Head: Normocephalic.  Eyes:     General: No scleral icterus. Cardiovascular:     Rate and Rhythm: Normal rate and regular rhythm.     Heart sounds: Normal heart sounds.  Pulmonary:     Effort: Pulmonary effort  is normal. No respiratory distress.     Breath sounds: No wheezing.     Comments: Decreased breath sound bilaterally Abdominal:     General: Bowel sounds are normal. There is no distension.     Palpations: Abdomen is soft.  Musculoskeletal:        General:  No deformity. Normal range of motion.     Cervical back: Normal range of motion and neck supple.  Lymphadenopathy:     Cervical: No cervical adenopathy.  Skin:    General: Skin is warm and dry.     Findings: No erythema or rash.  Neurological:     Mental Status: He is alert and oriented to person, place, and time. Mental status is at baseline.     Cranial Nerves: No cranial nerve deficit.  Psychiatric:        Mood and Affect: Mood normal.      LABORATORY DATA:  I have reviewed the data as listed     Latest Ref Rng & Units 09/21/2022   12:38 PM 08/17/2022    1:08 PM 07/26/2022    1:54 PM  CBC  WBC 4.0 - 10.5 K/uL 4.3  5.0  5.6   Hemoglobin 13.0 - 17.0 g/dL 12.8  12.6  13.4   Hematocrit 39.0 - 52.0 % 39.8  39.0  41.6   Platelets 150 - 400 K/uL 215  245  233       Latest Ref Rng & Units 09/21/2022   12:38 PM 08/17/2022    1:08 PM 07/26/2022    1:54 PM  CMP  Glucose 70 - 99 mg/dL 146  106  105   BUN 8 - 23 mg/dL '16  17  20   '$ Creatinine 0.61 - 1.24 mg/dL 0.72  0.86  0.96   Sodium 135 - 145 mmol/L 141  139  140   Potassium 3.5 - 5.1 mmol/L 3.6  4.1  4.1   Chloride 98 - 111 mmol/L 104  102  102   CO2 22 - 32 mmol/L 31  32  31   Calcium 8.9 - 10.3 mg/dL 8.9  8.9  9.0   Total Protein 6.5 - 8.1 g/dL 7.2  7.1  7.4   Total Bilirubin 0.3 - 1.2 mg/dL 0.2  0.3  0.2   Alkaline Phos 38 - 126 U/L 108  107  106   AST 15 - 41 U/L '17  25  19   '$ ALT 0 - 44 U/L '7  20  9      '$ RADIOGRAPHIC STUDIES: I have personally reviewed the radiological images as listed and agreed with the findings in the report. CT FOOT RIGHT WO CONTRAST  Result Date: 06/25/2022 CLINICAL DATA:  Clydene Laming chipper fell on right foot. EXAM: CT OF THE RIGHT FOOT WITHOUT  CONTRAST TECHNIQUE: Multidetector CT imaging of the right foot was performed according to the standard protocol. Multiplanar CT image reconstructions were also generated. RADIATION DOSE REDUCTION: This exam was performed according to the departmental dose-optimization program which includes automated exposure control, adjustment of the mA and/or kV according to patient size and/or use of iterative reconstruction technique. COMPARISON:  None Available. FINDINGS: Bones/Joint/Cartilage Acute comminuted depressed fracture of the calcaneus extending into the subtalar joint. Primary fracture line courses through the central portion of the posterior facet. 1-2 mm articular surface depression of the posterior facet medially. Tiny avulsion fracture of the medial tubercle of the posterior talar process (series 11, image 61). No dislocation. Joint spaces are relatively preserved. Small tibiotalar and subtalar joint effusions. Ligaments Ligaments are suboptimally evaluated by CT. Muscles and Tendons Grossly intact. Soft tissue Diffuse soft tissue swelling. No fluid collection or hematoma. No soft tissue mass. IMPRESSION: 1. Acute Sanders type 2b fracture of the calcaneus as described above. 2. Tiny avulsion fracture of the medial tubercle of the  posterior talar process. Electronically Signed   By: Titus Dubin M.D.   On: 06/25/2022 17:49

## 2022-09-21 NOTE — Assessment & Plan Note (Signed)
multifactorial Recommend empiric B12 and Vitamin D supplementation.  Check levels at next visit.

## 2022-09-22 ENCOUNTER — Other Ambulatory Visit (HOSPITAL_COMMUNITY): Payer: Self-pay

## 2022-10-11 ENCOUNTER — Inpatient Hospital Stay: Payer: PPO

## 2022-10-11 ENCOUNTER — Ambulatory Visit: Payer: PPO | Admitting: Oncology

## 2022-10-11 ENCOUNTER — Other Ambulatory Visit: Payer: PPO

## 2022-10-11 VITALS — BP 140/66 | HR 77

## 2022-10-11 DIAGNOSIS — H2513 Age-related nuclear cataract, bilateral: Secondary | ICD-10-CM | POA: Diagnosis not present

## 2022-10-11 DIAGNOSIS — Z135 Encounter for screening for eye and ear disorders: Secondary | ICD-10-CM | POA: Diagnosis not present

## 2022-10-11 DIAGNOSIS — Z5111 Encounter for antineoplastic chemotherapy: Secondary | ICD-10-CM | POA: Diagnosis not present

## 2022-10-11 DIAGNOSIS — H524 Presbyopia: Secondary | ICD-10-CM | POA: Diagnosis not present

## 2022-10-11 DIAGNOSIS — H10233 Serous conjunctivitis, except viral, bilateral: Secondary | ICD-10-CM | POA: Diagnosis not present

## 2022-10-11 DIAGNOSIS — H5213 Myopia, bilateral: Secondary | ICD-10-CM | POA: Diagnosis not present

## 2022-10-11 DIAGNOSIS — C61 Malignant neoplasm of prostate: Secondary | ICD-10-CM

## 2022-10-11 DIAGNOSIS — H52223 Regular astigmatism, bilateral: Secondary | ICD-10-CM | POA: Diagnosis not present

## 2022-10-11 DIAGNOSIS — D3132 Benign neoplasm of left choroid: Secondary | ICD-10-CM | POA: Diagnosis not present

## 2022-10-11 MED ORDER — LEUPROLIDE ACETATE (6 MONTH) 45 MG ~~LOC~~ KIT
45.0000 mg | PACK | Freq: Once | SUBCUTANEOUS | Status: AC
  Administered 2022-10-11: 45 mg via SUBCUTANEOUS
  Filled 2022-10-11: qty 45

## 2022-10-12 ENCOUNTER — Other Ambulatory Visit (HOSPITAL_COMMUNITY): Payer: Self-pay

## 2022-10-19 ENCOUNTER — Ambulatory Visit: Payer: PPO | Admitting: Nurse Practitioner

## 2022-10-19 DIAGNOSIS — R5383 Other fatigue: Secondary | ICD-10-CM

## 2022-10-19 DIAGNOSIS — Z515 Encounter for palliative care: Secondary | ICD-10-CM

## 2022-10-19 DIAGNOSIS — C61 Malignant neoplasm of prostate: Secondary | ICD-10-CM | POA: Diagnosis not present

## 2022-10-20 ENCOUNTER — Encounter: Payer: Self-pay | Admitting: Nurse Practitioner

## 2022-10-20 NOTE — Progress Notes (Signed)
[facility] Consult Note Telephone: [phone]  Fax: [phone]    Date of encounter: 10/20/22 9:07 AM PATIENT NAME: Sean Wood [address] [address]   [id number] (home)  DOB: 19-Apr-1936 MRN: [id number] PRIMARY CARE PROVIDER:    Margo Common, [name] (Inactive),   RESPONSIBLE PARTY:    Contact Information     Name Relation Home Work Mobile   [place] Daughter   [phone]   Leontine Locket   [phone]   Kamrin, [place] Daughter [phone]        I connected with  [name] on 10/20/22 by a telephone as video not available enabled telemedicine application and verified that I am speaking with the correct person using two identifiers.   I discussed the limitations of evaluation and management by telemedicine. The patient expressed understanding and agreed to proceed. Palliative Care to address advance care planning and complex medical decision making. This is a follow up visit.                                  ASSESSMENT AND PLAN / RECOMMENDATIONS:  Symptom Management/Plan: Advance Care Planning; DNR; goldenrod form in home  PC program changing with PC RN/PC SW to continue to follow with [facility] NP signing off.    2. Fatigue secondary to prostate cancer with ongoing treatment as he currently is on an oral regimen; discussed at length about sleep patterns, hygiene, importance of self care, resting   3. Palliative care encounter; Palliative care encounter; Palliative medicine team will continue to support patient, patient's family, and medical team. Visit consisted of counseling and education dealing with the complex and emotionally intense issues of symptom management and palliative care in the setting of serious and potentially life-threatening illness   4. Anorexia; discussed appetite, supplements, nutritional education done.  12/04/2021 weight 142 lbs 06/10/2022 weight  143.9 lbs 07/26/2022 weight 140.8 lbs 09/21/2022 weight 145 lbs I spent 25 minutes providing this consultation. More than 50% of the time in this consultation was spent in counseling and care coordination. PPS: 50% Chief Complaint: Follow up palliative consult for complex medical decision making, address goals, manage ongoing symptoms  HISTORY OF PRESENT ILLNESS:  Sanford Lindblad. is a 86 y.o. year old male  with multiple medical problems including prostate cancer s/p post brachytherapy with reoccurance 09/2021 on darolutamide, COPD, arthritis, h/o gross hematuria, h/o hiatial hernia, h/o kidney stone. I called Mr. Neisen for telemedicine telephonic PC f/u visit, Mr. Rosenberg in agreement. We talked about how he has been feeling. We talked about ros, recent Oncology visits. We talked about his functional abilities, appetite. We talked about weights with 5 lb gain. We talked about medical goals. We talked about PC program changing with PC RN/PC SW to continue to follow with [facility] NP signing off.   We talked about f/u pc visit, in agreement for telephone visit, scheduled. Therapeutic listening, emotional support provided. Questions answered. Mr. Dibiasio currently declined immediate needs.    History obtained from review of EMR, discussion with primary team, and interview with family, facility staff/caregiver and/or Mr. Scheidt.  I reviewed available labs, medications, imaging, studies and related documents from the EMR.  Records reviewed and summarized above.   ROS 10 point system reviewed all negative except HPI  Physical Exam: deferred Thank you for the opportunity to participate in the care of Mr. Heal.  The palliative care team will continue to follow. Please call our office at [phone] if we can be of additional assistance.   Trudee Chirino Ihor Gully, NP

## 2022-10-21 ENCOUNTER — Other Ambulatory Visit (HOSPITAL_COMMUNITY): Payer: Self-pay

## 2022-10-21 ENCOUNTER — Encounter: Payer: Self-pay | Admitting: Oncology

## 2022-10-21 ENCOUNTER — Telehealth: Payer: Self-pay

## 2022-10-21 NOTE — Telephone Encounter (Signed)
Oral Oncology Patient Advocate Encounter  Was successful in securing patient a $8000.00 grant from [employer] to provide copayment coverage for [place].  This will keep the out of pocket expense at $0.     [employer] ID: [id number]  I have spoken with the patient.   The billing information is as follows and has been shared with [facility].    RxBin: [id number] PCN: [id number] Member ID: [id number] Group ID: [id number] Dates of Eligibility: 10/07/2022 through 10/07/2023  Fund:  [place], [facility] Patient [place]  [phone] (phone) [phone] (fax) 10/21/2022 11:10 AM

## 2022-10-27 ENCOUNTER — Ambulatory Visit: Payer: Self-pay | Admitting: *Deleted

## 2022-10-27 NOTE — Patient Outreach (Signed)
  Care Coordination   10/27/2022 Name: Shuayb Schepers. MRN: [id number] DOB: [birth year]   Care Coordination Outreach Attempts:  An unsuccessful telephone outreach was attempted for a scheduled appointment today.  Follow Up Plan:  Additional outreach attempts will be made to offer the patient care coordination information and services.   Encounter Outcome:  No Answer  Care Coordination Interventions Activated:  No   Care Coordination Interventions:  No, not indicated    Valente David, RN, MSN, [facility] Care Management Care Management Coordinator [phone]

## 2022-10-27 NOTE — Patient Outreach (Signed)
  Care Coordination   Follow Up Visit Note   10/27/2022 Name: Sean Wood. MRN: [id number] DOB: 03-26-1936  Sean Fitz. is a 86 y.o. year old male who sees Chrismon, Vickki Muff, [name] (Inactive) for primary care. I spoke with  Sean Fitz. by phone today.  What matters to the patients health and wellness today?  Foot has healed, has swelling at times that is relieved with elevation.  Denies pain.  Denies any urgent concerns, encouraged to contact this care manager with questions.    Goals Addressed             This Visit's Progress    COMPLETED: Recovery of foot injury       Care Coordination Interventions: Evaluation of current treatment plan related to foot injury from July of this year and patient's adherence to plan as established by provider Advised patient to call the office for changes in chronic conditions, questions or concerns Provided education to patient re: reaching out to the [facility] to assist with needs, resources, education, questions, or concerns Reviewed medications with patient and discussed compliance Collaborated with [facility] office staff regarding the need for an appointment to have a cyst on the back of the patients neck that needs intervention Reviewed scheduled/upcoming provider appointments including cancer center on 11/14 and home visit from palliative care on 11/16 Discussed plans with patient for ongoing care management follow up and provided patient with direct contact information for care management team Advised patient to discuss changes, questions or concerns about his health and well being with provider The patient lost his wife in April of this year. She had dementia and he was her primary caregiver. She was the "love of his life". He misses her greatly but is doing good. His adult children check on him daily.  Active listening / Reflection utilized  Emotional Support Provided          SDOH assessments and interventions completed:   No     Care Coordination Interventions Activated:  Yes  Care Coordination Interventions:  Yes, provided   Follow up plan: No further intervention required.   Encounter Outcome:  Pt. Visit Completed   Sean David, RN,MSN, [place] Care Management Care Management Coordinator [phone]

## 2022-11-02 ENCOUNTER — Inpatient Hospital Stay: Payer: PPO | Attending: Oncology

## 2022-11-02 ENCOUNTER — Inpatient Hospital Stay (HOSPITAL_BASED_OUTPATIENT_CLINIC_OR_DEPARTMENT_OTHER): Payer: PPO | Admitting: Oncology

## 2022-11-02 ENCOUNTER — Encounter: Payer: Self-pay | Admitting: Oncology

## 2022-11-02 VITALS — BP 138/71 | HR 61 | Temp 97.7°F | Resp 18 | Wt 143.0 lb

## 2022-11-02 DIAGNOSIS — R5383 Other fatigue: Secondary | ICD-10-CM | POA: Insufficient documentation

## 2022-11-02 DIAGNOSIS — J449 Chronic obstructive pulmonary disease, unspecified: Secondary | ICD-10-CM | POA: Diagnosis not present

## 2022-11-02 DIAGNOSIS — J439 Emphysema, unspecified: Secondary | ICD-10-CM | POA: Diagnosis not present

## 2022-11-02 DIAGNOSIS — G4709 Other insomnia: Secondary | ICD-10-CM

## 2022-11-02 DIAGNOSIS — G4700 Insomnia, unspecified: Secondary | ICD-10-CM | POA: Diagnosis not present

## 2022-11-02 DIAGNOSIS — Z87891 Personal history of nicotine dependence: Secondary | ICD-10-CM | POA: Diagnosis not present

## 2022-11-02 DIAGNOSIS — C61 Malignant neoplasm of prostate: Secondary | ICD-10-CM | POA: Insufficient documentation

## 2022-11-02 DIAGNOSIS — J438 Other emphysema: Secondary | ICD-10-CM | POA: Diagnosis not present

## 2022-11-02 LAB — COMPREHENSIVE METABOLIC PANEL
ALT: 7 U/L (ref 0–44)
AST: 18 U/L (ref 15–41)
Albumin: 4.4 g/dL (ref 3.5–5.0)
Alkaline Phosphatase: 95 U/L (ref 38–126)
Anion gap: 8 (ref 5–15)
BUN: 19 mg/dL (ref 8–23)
CO2: 31 mmol/L (ref 22–32)
Calcium: 9.5 mg/dL (ref 8.9–10.3)
Chloride: 100 mmol/L (ref 98–111)
Creatinine, Ser: 0.8 mg/dL (ref 0.61–1.24)
GFR, Estimated: >60 mL/min (ref >60)
Glucose, Bld: 102 mg/dL — ABNORMAL HIGH (ref 70–99)
Potassium: 4.3 mmol/L (ref 3.5–5.1)
Sodium: 139 mmol/L (ref 135–145)
Total Bilirubin: 0.4 mg/dL (ref 0.3–1.2)
Total Protein: 7.8 g/dL (ref 6.5–8.1)

## 2022-11-02 LAB — PSA: Prostatic Specific Antigen: 1.21 ng/mL (ref 0.00–4.00)

## 2022-11-02 LAB — CBC WITH DIFFERENTIAL/PLATELET
Abs Immature Granulocytes: 0.03 10*3/uL (ref 0.00–0.07)
Basophils Absolute: 0 10*3/uL (ref 0.0–0.1)
Basophils Relative: 0 %
Eosinophils Absolute: 0 10*3/uL (ref 0.0–0.5)
Eosinophils Relative: 0 %
HCT: 45.4 % (ref 39.0–52.0)
Hemoglobin: 14.4 g/dL (ref 13.0–17.0)
Immature Granulocytes: 1 %
Lymphocytes Relative: 48 %
Lymphs Abs: 2.4 10*3/uL (ref 0.7–4.0)
MCH: 27.6 pg (ref 26.0–34.0)
MCHC: 31.7 g/dL (ref 30.0–36.0)
MCV: 87.1 fL (ref 80.0–100.0)
Monocytes Absolute: 0.6 10*3/uL (ref 0.1–1.0)
Monocytes Relative: 13 %
Neutro Abs: 1.9 10*3/uL (ref 1.7–7.7)
Neutrophils Relative %: 38 %
Platelets: 213 10*3/uL (ref 150–400)
RBC: 5.21 MIL/uL (ref 4.22–5.81)
RDW: 13.7 % (ref 11.5–15.5)
WBC: 5 10*3/uL (ref 4.0–10.5)
nRBC: 0 % (ref 0.0–0.2)

## 2022-11-02 MED ORDER — TRAZODONE HCL 50 MG PO TABS: 50.0000 mg | ORAL_TABLET | Freq: Every evening | ORAL | 1 refills | Status: DC | PRN

## 2022-11-02 NOTE — Assessment & Plan Note (Signed)
COPD/Emphysema, recommend smoke cessation.  Patient declines pulmonology follow-ups.  He does not actively follow-up with PCP Continue albuterol as needed for wheezing/shortness of breath, Advair  twice daily-patient admits not being very compliant. I encourage patient to increase compliance and make a follow-up with appointment with pulmonology.

## 2022-11-02 NOTE — Assessment & Plan Note (Addendum)
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  Check levels at next visit.

## 2022-11-02 NOTE — Progress Notes (Signed)
Hematology/Oncology Progress note [phone] [phone]      Patient Care Team: Chrismon, Vickki Muff, [name] (Inactive) as PCP - General (Physician Assistant) Garrel Ridgel, DPM as Consulting Physician (Podiatry) Noreene Filbert, MD as Referring Physician (Radiation Oncology) Idelle Leech, OD as Consulting Physician (Optometry) Jiovanni Server, MD as Consulting Physician (Oncology) Valente David, RN as [place] [facility]   ASSESSMENT & PLAN:   Cancer Staging  No matching staging information was found for the patient.  Malignant neoplasm of prostate [facility]) Prostate Cancer with biochemical recurrence, asymptomatic, castration resistant nonmetastatic.  Labs reviewed and discussed with patient. He did not tolerate Apalutamide 240 mg daily due to severe fatigue.  Still fatigued while on '180mg'$  daily.  Recommend to further decrease to 120 mg daily.  Previous PSMA PET scan showed elevated SUV in the prostate/prostate bed, consider future reestablishment of Radonc for evaluation of additional radiation. Continue ADT plan to switch to Eligard '45mg'$  Q6 months -last given 10/11/2022  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HCC) COPD/Emphysema, recommend smoke cessation.  Patient declines pulmonology follow-ups.  He does not actively follow-up with PCP Continue albuterol as needed for wheezing/shortness of breath, Advair  twice daily-patient admits not being very compliant. I encourage patient to increase compliance and make a follow-up with appointment with pulmonology.  Other fatigue multifactorial Recommend empiric B12 and Vitamin D supplementation.  Check levels at next visit.   Insomnia Recommend trazodone 50 mg nightly as needed.  Rationale and potential side effects reviewed and discussed patient.  He agrees with the plan.  Prescription sent to pharmacy.  Orders Placed This Encounter  Procedures   CBC with Differential/Platelet    Standing  Status:   Future    Standing Expiration Date:   11/03/2023   Comprehensive metabolic panel    Standing Status:   Future    Standing Expiration Date:   11/03/2023   PSA    Standing Status:   Future    Standing Expiration Date:   11/03/2023    Follow up  1 month  All questions were answered. The patient knows to call the clinic with any problems, questions or concerns.  Jamarie Server, MD, PhD [facility] Hematology Oncology 11/02/2022    HISTORY OF PRESENTING ILLNESS:  Sean Wood. is a  86 y.o.  male with PMH listed below who was referred to me for evaluation of prostate cancer.   Cancer history dated back to 2009 when He was diagnosed with intermediate risk, Gleason 3+4, T1c prostate cancer in 2009. PSA at diagnosis was 6.1. IMRT was completed in August 2009. PSA nadir was 0.5. His PSA January 2013 was 0.8 and in January 2014 was 1.3. This was repeated in May 2014 and had increased to 1.8. He elected a repeat prostate biopsy in November 2014 after his PSA in October had increased to 2.2. This showed a focus of Gleason 3+3 adenocarcinoma from the right prostate involving less than 5%. He elected surveillance after discussing options. In May 2016, he was evaluated by Dr.Chrystal and he received salvage seed implantation with I-125. He subsequently follows with Dr.Chrystal for Lupron every 4 months since then. Achieve PSA nadir of 0.06 in 09/2016, and started to trend up and most recently trended up to 9.64.   # Lupron 30 mg on 05/10/2018, 06/14/2018 testosterone level was at 6  # Leupron 22.'5mg'$  11/06/2018.  Delayed as patient reports feeling very fatigued and we discussed about holding Lupron.  74-monthand restarted on 11/06/2018.  10/06/2021, PSMA PET scan showed marked radiotracer accumulation with elevated SUV in the prostate/prostate bed tracking towards the bladder base with distortion of the urinary bladder.  Signs of brachytherapy in the prostate as well.  No definitive signs of  extraprostatic disease.  A single lymph node in the left the pelvis with only minimally elevated SUV.  Equivocal.  Aortic atherosclerosis.  10/20/2021 Darolutamide '600mg'$  BID 01/01/2022, decreased to Darolutamide '300mg'$  BID 06/10/2022 Darolutamide '300mg'$  BID held 07/27/2022, started on apalutamide 240 mg daily. INTERVAL HISTORY Sean Wood. is a 86 y.o. male who has above history reviewed by me present for follow-up of management of biochemical recurrence of prostate cancer Patient takes apalutamide 180 mg daily, patient still feels quite fatigued.  Reports sitting down all day long.  Patient was accompanied by his daughter.  Daughter reports that patient has sleeping disturbance.      Review of Systems  Constitutional:  Positive for malaise/fatigue. Negative for chills, fever and weight loss.  HENT:  Negative for sore throat.   Eyes:  Negative for redness.  Respiratory:  Positive for shortness of breath. Negative for cough and wheezing.   Cardiovascular:  Negative for chest pain, palpitations and leg swelling.  Gastrointestinal:  Negative for abdominal pain, blood in stool, nausea and vomiting.  Genitourinary:  Negative for dysuria.  Musculoskeletal:  Negative for myalgias.  Skin:  Negative for rash.  Neurological:  Negative for dizziness, tingling and tremors.  Endo/Heme/Allergies:  Does not bruise/bleed easily.  Psychiatric/Behavioral:  Negative for hallucinations. The patient has insomnia.     MEDICAL HISTORY:  Past Medical History:  Diagnosis Date   Arthritis    COPD (chronic obstructive pulmonary disease) (HCC)    Elevated PSA    Hematuria, gross    History of hiatal hernia    HOH (hard of hearing)    Kidney stone    Prostate cancer ([place])    Shortness of breath dyspnea     SURGICAL HISTORY: Past Surgical History:  Procedure Laterality Date   [place]  06/24/2015   Procedure: CYSTOSCOPY;  Surgeon: Hollice Espy, MD;  Location: [facility] ORS;   Service: Urology;;   HERNIA REPAIR     left and right inguinal hernia repair   RADIOACTIVE SEED IMPLANT N/A 06/24/2015   Procedure: RADIOACTIVE SEED IMPLANT/BRACHYTHERAPY IMPLANT;  Surgeon: Hollice Espy, MD;  Location: [facility] ORS;  Service: Urology;  Laterality: N/A;    SOCIAL HISTORY: Social History   Socioeconomic History   Marital status: Widowed    Spouse name: Not on file   Number of children: 1   Years of education: Not on file   Highest education level: 8th grade  Occupational History   Occupation: retired  Tobacco Use   Smoking status: Former    Packs/day: 1.50    Types: Cigarettes    Quit date: 05/24/1981    Years since quitting: 41.4   Smokeless tobacco: Current    Types: Chew  Vaping Use   Vaping Use: Never used  Substance and Sexual Activity   Alcohol use: Not Currently    Alcohol/week: 0.0 standard drinks of alcohol   Drug use: No   Sexual activity: Not on file  Other Topics Concern   Not on file  Social History Narrative   Not on file   Social Determinants of Health   Financial Resource Strain: Low Risk  (08/26/2022)   Overall Financial Resource Strain (CARDIA)    Difficulty of Paying Living Expenses: Not  hard at all  Food Insecurity: No Food Insecurity (08/26/2022)   Hunger Vital Sign    Worried About Running Out of Food in the Last Year: Never true    Ran Out of Food in the Last Year: Never true  Transportation Needs: No Transportation Needs (08/26/2022)   PRAPARE - Hydrologist (Medical): No    Lack of Transportation (Non-Medical): No  Physical Activity: Inactive (09/19/2019)   Exercise Vital Sign    Days of Exercise per Week: 0 days    Minutes of Exercise per Session: 0 min  Stress: No Stress Concern Present (09/19/2019)   [place]    Feeling of Stress : Not at all  Social Connections: Socially Isolated (08/26/2022)   Social Connection and Isolation Panel  [NHANES]    Frequency of Communication with Friends and Family: More than three times a week    Frequency of Social Gatherings with Friends and Family: More than three times a week    Attends Religious Services: Never    Marine scientist or Organizations: No    Attends Archivist Meetings: Never    Marital Status: Widowed  Intimate Partner Violence: Not At Risk (08/26/2022)   Humiliation, Afraid, Rape, and Kick questionnaire    Fear of Current or Ex-Partner: No    Emotionally Abused: No    Physically Abused: No    Sexually Abused: No    FAMILY HISTORY: Family History  Problem Relation Age of Onset   Cancer Brother    Heart disease Brother    Diabetes Brother    Emphysema Brother    Prostate cancer Brother    Diabetes Sister    Emphysema Mother    Healthy Sister    Healthy Sister    Healthy Sister    Diabetes Brother    Diabetes Brother    Diabetes Brother    Healthy Brother     ALLERGIES:  is allergic to [employer] [lovastatin], niacin and related, statins, and aleve [naproxen sodium].  MEDICATIONS:  Current Outpatient Medications  Medication Sig Dispense Refill   albuterol (VENTOLIN HFA) 108 (90 Base) MCG/ACT inhaler Inhale 2 puffs into the lungs every 6 (six) hours as needed for wheezing or shortness of breath. 8 g 2   apalutamide (ERLEADA) 60 MG tablet Take 3 tablets (180 mg total) by mouth daily. 90 tablet 0   aspirin EC 81 MG tablet Take 81 mg by mouth daily. Swallow whole.     fluticasone-salmeterol (ADVAIR DISKUS) 250-50 MCG/ACT AEPB Inhale 1 puff into the lungs in the morning and at bedtime. 60 each 1   traZODone (DESYREL) 50 MG tablet Take 1 tablet (50 mg total) by mouth at bedtime as needed for sleep. 30 tablet 1   calcium-vitamin D (OSCAL WITH D) 500-5 MG-MCG tablet Take 2 tablets by mouth daily. (Patient not taking: Reported on 09/21/2022) 90 tablet 1   No current facility-administered medications for this visit.     PHYSICAL EXAMINATION: ECOG  PERFORMANCE STATUS: 1 - Symptomatic but completely ambulatory Vitals:   11/02/22 1321  BP: 138/71  Pulse: 61  Resp: 18  Temp: 97.7 F (36.5 C)   Filed Weights   11/02/22 1321  Weight: 143 lb (64.9 kg)     Physical Exam Constitutional:      General: He is not in acute distress.    Comments: Patient walks independently.  HENT:     Head: Normocephalic.  Eyes:  General: No scleral icterus. Cardiovascular:     Rate and Rhythm: Normal rate and regular rhythm.     Heart sounds: Normal heart sounds.  Pulmonary:     Effort: Pulmonary effort is normal. No respiratory distress.     Breath sounds: No wheezing.     Comments: Decreased breath sound bilaterally Abdominal:     General: Bowel sounds are normal. There is no distension.     Palpations: Abdomen is soft.  Musculoskeletal:        General: No deformity. Normal range of motion.     Cervical back: Normal range of motion and neck supple.  Lymphadenopathy:     Cervical: No cervical adenopathy.  Skin:    General: Skin is warm and dry.     Findings: No erythema or rash.  Neurological:     Mental Status: He is alert and oriented to person, place, and time. Mental status is at baseline.     Cranial Nerves: No cranial nerve deficit.  Psychiatric:        Mood and Affect: Mood normal.      LABORATORY DATA:  I have reviewed the data as listed     Latest Ref Rng & Units 11/02/2022   12:36 PM 09/21/2022   12:38 PM 08/17/2022    1:08 PM  CBC  WBC 4.0 - 10.5 K/uL 5.0  4.3  5.0   Hemoglobin 13.0 - 17.0 g/dL 14.4  12.8  12.6   Hematocrit 39.0 - 52.0 % 45.4  39.8  39.0   Platelets 150 - 400 K/uL 213  215  245       Latest Ref Rng & Units 11/02/2022   12:36 PM 09/21/2022   12:38 PM 08/17/2022    1:08 PM  CMP  Glucose 70 - 99 mg/dL 102  146  106   BUN 8 - 23 mg/dL '19  16  17   '$ Creatinine 0.61 - 1.24 mg/dL 0.80  0.72  0.86   Sodium 135 - 145 mmol/L 139  141  139   Potassium 3.5 - 5.1 mmol/L 4.3  3.6  4.1   Chloride 98 -  111 mmol/L 100  104  102   CO2 22 - 32 mmol/L 31  31  32   Calcium 8.9 - 10.3 mg/dL 9.5  8.9  8.9   Total Protein 6.5 - 8.1 g/dL 7.8  7.2  7.1   Total Bilirubin 0.3 - 1.2 mg/dL 0.4  0.2  0.3   Alkaline Phos 38 - 126 U/L 95  108  107   AST 15 - 41 U/L '18  17  25   '$ ALT 0 - 44 U/L '7  7  20      '$ RADIOGRAPHIC STUDIES: I have personally reviewed the radiological images as listed and agreed with the findings in the report. No results found.

## 2022-11-02 NOTE — Assessment & Plan Note (Signed)
Recommend trazodone 50 mg nightly as needed.  Rationale and potential side effects reviewed and discussed patient.  He agrees with the plan.  Prescription sent to pharmacy.

## 2022-11-02 NOTE — Addendum Note (Signed)
Addended by: Santo Server on: 11/02/2022 09:32 PM   Modules accepted: Orders

## 2022-11-02 NOTE — Progress Notes (Signed)
Pt here for follow up. No new concerns voiced.   

## 2022-11-02 NOTE — Assessment & Plan Note (Signed)
Prostate Cancer with biochemical recurrence, asymptomatic, castration resistant nonmetastatic.  Labs reviewed and discussed with patient. He did not tolerate Apalutamide 240 mg daily due to severe fatigue.  Still fatigued while on '180mg'$  daily.  Recommend to further decrease to 120 mg daily.  Previous PSMA PET scan showed elevated SUV in the prostate/prostate bed, consider future reestablishment of Radonc for evaluation of additional radiation. Continue ADT plan to switch to Eligard '45mg'$  Q6 months -last given 10/11/2022

## 2022-11-04 ENCOUNTER — Other Ambulatory Visit: Payer: PPO

## 2022-11-04 VITALS — BP 122/70 | HR 71 | Temp 97.7°F

## 2022-11-04 DIAGNOSIS — Z515 Encounter for palliative care: Secondary | ICD-10-CM

## 2022-11-04 NOTE — Progress Notes (Signed)
PATIENT NAME: Sean Wood. DOB: 24-Dec-1935 MRN: [id number]  PRIMARY CARE PROVIDER: Margo Common, [name] (Inactive)  RESPONSIBLE PARTY:  Acct ID - Guarantor Home Phone Work Phone Relationship Acct Type  [id number] [name], [name]  Self P/F     [address], [place] [place], [place] [address]    Appetite:  Patient endorses eating 2 meals a day (breakfast and dinner).  Not a big on snacks. Weight stable.  Fatigue:  Ongoing.  Recent decrease in Apalutamide.    Functional Status:  Living independently and continues to drive.  Daughter is coming on Sundays to help with any household chores that patient needs assistance with.  Continues to cook for himself.  Has a cane he uses periodically.  No recent falls.   Completing ADL's on his own.  Grief:  Patient talks about the passing of his wife earlier this year and sharing memories of life together.  Active listening provided.  Shortness of breath:  Endorses dyspnea with exertion.  Has inhaler but rarely uses.   CODE STATUS: DNR ADVANCED DIRECTIVES: No MOST FORM: Yes PPS: 50%   PHYSICAL EXAM:   VITALS: Today's Vitals   11/04/22 1010  BP: 122/70  Pulse: 71  Temp: 97.7 F (36.5 C)  SpO2: 95%          Lorenza Burton, RN

## 2022-11-15 ENCOUNTER — Other Ambulatory Visit (HOSPITAL_COMMUNITY): Payer: Self-pay

## 2022-11-15 ENCOUNTER — Other Ambulatory Visit: Payer: Self-pay | Admitting: Oncology

## 2022-11-15 ENCOUNTER — Telehealth: Payer: Self-pay | Admitting: *Deleted

## 2022-11-15 DIAGNOSIS — C61 Malignant neoplasm of prostate: Secondary | ICD-10-CM

## 2022-11-15 MED ORDER — APALUTAMIDE 60 MG PO TABS
60.0000 mg | ORAL_TABLET | Freq: Every day | ORAL | 0 refills | Status: DC
  Filled 2022-11-15: qty 30, 30d supply, fill #0

## 2022-11-15 NOTE — Telephone Encounter (Signed)
[name] called asking if patient dose Sean Wood) can be reduced from 2 to 1 because "it wipes him out". Please advise

## 2022-11-16 ENCOUNTER — Encounter: Payer: Self-pay | Admitting: Oncology

## 2022-11-16 ENCOUNTER — Other Ambulatory Visit (HOSPITAL_COMMUNITY): Payer: Self-pay

## 2022-11-16 NOTE — Telephone Encounter (Signed)
CAll returned to [place] and informed of physician approval to reduce dose to 1 pill a day

## 2022-11-17 ENCOUNTER — Other Ambulatory Visit (HOSPITAL_COMMUNITY): Payer: Self-pay

## 2022-12-07 ENCOUNTER — Other Ambulatory Visit: Payer: Self-pay

## 2022-12-08 ENCOUNTER — Encounter: Payer: Self-pay | Admitting: Oncology

## 2022-12-08 ENCOUNTER — Inpatient Hospital Stay: Payer: PPO | Attending: Oncology

## 2022-12-08 ENCOUNTER — Inpatient Hospital Stay (HOSPITAL_BASED_OUTPATIENT_CLINIC_OR_DEPARTMENT_OTHER): Payer: PPO | Admitting: Oncology

## 2022-12-08 ENCOUNTER — Other Ambulatory Visit (HOSPITAL_COMMUNITY): Payer: Self-pay

## 2022-12-08 VITALS — BP 143/69 | HR 78 | Temp 96.2°F | Wt 140.3 lb

## 2022-12-08 DIAGNOSIS — J449 Chronic obstructive pulmonary disease, unspecified: Secondary | ICD-10-CM | POA: Insufficient documentation

## 2022-12-08 DIAGNOSIS — R5383 Other fatigue: Secondary | ICD-10-CM | POA: Insufficient documentation

## 2022-12-08 DIAGNOSIS — J439 Emphysema, unspecified: Secondary | ICD-10-CM | POA: Insufficient documentation

## 2022-12-08 DIAGNOSIS — J438 Other emphysema: Secondary | ICD-10-CM | POA: Diagnosis not present

## 2022-12-08 DIAGNOSIS — Z79818 Long term (current) use of other agents affecting estrogen receptors and estrogen levels: Secondary | ICD-10-CM | POA: Diagnosis not present

## 2022-12-08 DIAGNOSIS — C61 Malignant neoplasm of prostate: Secondary | ICD-10-CM

## 2022-12-08 LAB — CBC WITH DIFFERENTIAL/PLATELET
Abs Immature Granulocytes: 0.03 10*3/uL (ref 0.00–0.07)
Basophils Absolute: 0 10*3/uL (ref 0.0–0.1)
Basophils Relative: 0 %
Eosinophils Absolute: 0 10*3/uL (ref 0.0–0.5)
Eosinophils Relative: 0 %
HCT: 45.3 % (ref 39.0–52.0)
Hemoglobin: 14.7 g/dL (ref 13.0–17.0)
Immature Granulocytes: 1 %
Lymphocytes Relative: 40 %
Lymphs Abs: 1.8 10*3/uL (ref 0.7–4.0)
MCH: 28.1 pg (ref 26.0–34.0)
MCHC: 32.5 g/dL (ref 30.0–36.0)
MCV: 86.5 fL (ref 80.0–100.0)
Monocytes Absolute: 0.7 10*3/uL (ref 0.1–1.0)
Monocytes Relative: 15 %
Neutro Abs: 2 10*3/uL (ref 1.7–7.7)
Neutrophils Relative %: 44 %
Platelets: 180 10*3/uL (ref 150–400)
RBC: 5.24 MIL/uL (ref 4.22–5.81)
RDW: 13.6 % (ref 11.5–15.5)
WBC: 4.6 10*3/uL (ref 4.0–10.5)
nRBC: 0 % (ref 0.0–0.2)

## 2022-12-08 LAB — COMPREHENSIVE METABOLIC PANEL
ALT: 8 U/L (ref 0–44)
AST: 16 U/L (ref 15–41)
Albumin: 4.6 g/dL (ref 3.5–5.0)
Alkaline Phosphatase: 80 U/L (ref 38–126)
Anion gap: 8 (ref 5–15)
BUN: 25 mg/dL — ABNORMAL HIGH (ref 8–23)
CO2: 30 mmol/L (ref 22–32)
Calcium: 9.4 mg/dL (ref 8.9–10.3)
Chloride: 104 mmol/L (ref 98–111)
Creatinine, Ser: 0.91 mg/dL (ref 0.61–1.24)
GFR, Estimated: >60 mL/min (ref >60)
Glucose, Bld: 128 mg/dL — ABNORMAL HIGH (ref 70–99)
Potassium: 4 mmol/L (ref 3.5–5.1)
Sodium: 142 mmol/L (ref 135–145)
Total Bilirubin: 0.4 mg/dL (ref 0.3–1.2)
Total Protein: 7.8 g/dL (ref 6.5–8.1)

## 2022-12-08 LAB — VITAMIN D 25 HYDROXY (VIT D DEFICIENCY, FRACTURES): Vit D, 25-Hydroxy: 45.2 ng/mL (ref 30–100)

## 2022-12-08 LAB — VITAMIN B12: Vitamin B-12: 408 pg/mL (ref 180–914)

## 2022-12-08 LAB — FOLATE: Folate: 20.7 ng/mL (ref >5.9)

## 2022-12-08 LAB — PSA: Prostatic Specific Antigen: 0.63 ng/mL (ref 0.00–4.00)

## 2022-12-08 MED ORDER — APALUTAMIDE 60 MG PO TABS
60.0000 mg | ORAL_TABLET | Freq: Every day | ORAL | 1 refills | Status: DC
  Filled 2022-12-08: qty 30, 30d supply, fill #0

## 2022-12-08 NOTE — Assessment & Plan Note (Signed)
Prostate Cancer with biochemical recurrence, asymptomatic, castration resistant nonmetastatic.  Labs reviewed and discussed with patient. He did not tolerate Apalutamide standard dose. I have recommend '120mg'$  daily, but he can only take '60mg'$  daily.   Previous PSMA PET scan showed elevated SUV in the prostate/prostate bed, consider future reestablishment of Radonc for evaluation of additional radiation. Continue ADT plan with Eligard '45mg'$  Q6 months -last given 10/11/2022

## 2022-12-09 ENCOUNTER — Other Ambulatory Visit (HOSPITAL_COMMUNITY): Payer: Self-pay

## 2022-12-09 ENCOUNTER — Other Ambulatory Visit: Payer: PPO

## 2022-12-09 ENCOUNTER — Other Ambulatory Visit: Payer: Self-pay

## 2022-12-09 ENCOUNTER — Encounter: Payer: Self-pay | Admitting: Oncology

## 2022-12-09 VITALS — BP 130/76 | HR 74 | Temp 97.5°F

## 2022-12-09 DIAGNOSIS — Z515 Encounter for palliative care: Secondary | ICD-10-CM

## 2022-12-09 NOTE — Assessment & Plan Note (Signed)
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  B12 level is normal

## 2022-12-09 NOTE — Assessment & Plan Note (Signed)
COPD/Emphysema, recommend smoke cessation.  Patient declines pulmonology follow-ups.  He does not actively follow-up with PCP Continue albuterol as needed for wheezing/shortness of breath, Advair  twice daily-patient admits not being very compliant. I encourage patient to increase compliance and make a follow-up with appointment with pulmonology.

## 2022-12-09 NOTE — Progress Notes (Signed)
Hematology/Oncology Progress note [phone] [phone]      Patient Care Team: Chrismon, Vickki Muff, [name] (Inactive) as PCP - General (Physician Assistant) Garrel Ridgel, DPM as Consulting Physician (Podiatry) Noreene Filbert, MD as Referring Physician (Radiation Oncology) Idelle Leech, OD as Consulting Physician (Optometry) Godson Server, MD as Consulting Physician (Oncology) Valente David, RN as [place] Management   ASSESSMENT & PLAN:   Cancer Staging  No matching staging information was found for the patient.  Malignant neoplasm of prostate [facility]) Prostate Cancer with biochemical recurrence, asymptomatic, castration resistant nonmetastatic.  Labs reviewed and discussed with patient. He did not tolerate Apalutamide standard dose. I have recommend '120mg'$  daily, but he can only take '60mg'$  daily.   Previous PSMA PET scan showed elevated SUV in the prostate/prostate bed, consider future reestablishment of Radonc for evaluation of additional radiation. Continue ADT plan with Eligard '45mg'$  Q6 months -last given 10/11/2022  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HCC) COPD/Emphysema, recommend smoke cessation.  Patient declines pulmonology follow-ups.  He does not actively follow-up with PCP Continue albuterol as needed for wheezing/shortness of breath, Advair  twice daily-patient admits not being very compliant. I encourage patient to increase compliance and make a follow-up with appointment with pulmonology.  Other fatigue Multifactorial, medication side effects, hypoxia secondary to COPD/emphysema, prolonged grieving/depression Recommend empiric B12 and Vitamin D supplementation.  B12 level is normal   Orders Placed This Encounter  Procedures   CBC with Differential/Platelet    Standing Status:   Future    Standing Expiration Date:   12/09/2023   Comprehensive metabolic panel    Standing Status:   Future    Standing Expiration Date:    12/08/2023   PSA    Standing Status:   Future    Standing Expiration Date:   12/09/2023    Follow up  1 month  All questions were answered. The patient knows to call the clinic with any problems, questions or concerns.  Tahjir Server, MD, PhD [facility] Hematology Oncology 12/08/2022    HISTORY OF PRESENTING ILLNESS:  Sean Wood. is a  86 y.o.  male with PMH listed below who was referred to me for evaluation of prostate cancer.   Cancer history dated back to 2009 when He was diagnosed with intermediate risk, Gleason 3+4, T1c prostate cancer in 2009. PSA at diagnosis was 6.1. IMRT was completed in August 2009. PSA nadir was 0.5. His PSA January 2013 was 0.8 and in January 2014 was 1.3. This was repeated in May 2014 and had increased to 1.8. He elected a repeat prostate biopsy in November 2014 after his PSA in October had increased to 2.2. This showed a focus of Gleason 3+3 adenocarcinoma from the right prostate involving less than 5%. He elected surveillance after discussing options. In May 2016, he was evaluated by Dr.Chrystal and he received salvage seed implantation with I-125. He subsequently follows with Dr.Chrystal for Lupron every 4 months since then. Achieve PSA nadir of 0.06 in 09/2016, and started to trend up and most recently trended up to 9.64.   # Lupron 30 mg on 05/10/2018, 06/14/2018 testosterone level was at 6  # Leupron 22.'5mg'$  11/06/2018.  Delayed as patient reports feeling very fatigued and we discussed about holding Lupron.  28-monthand restarted on 11/06/2018.  10/06/2021, PSMA PET scan showed marked radiotracer accumulation with elevated SUV in the prostate/prostate bed tracking towards the bladder base with distortion of the urinary bladder.  Signs of brachytherapy in the  prostate as well.  No definitive signs of extraprostatic disease.  A single lymph node in the left the pelvis with only minimally elevated SUV.  Equivocal.  Aortic atherosclerosis.  10/20/2021  Darolutamide '600mg'$  BID 01/01/2022, decreased to Darolutamide '300mg'$  BID 06/10/2022 Darolutamide '300mg'$  BID held 07/27/2022, started on apalutamide 240 mg daily. INTERVAL HISTORY Sean Wood. is a 86 y.o. male who has above history reviewed by me present for follow-up of management of biochemical recurrence of prostate cancer  Patient was accompanied by his daughter. He is only taking '60mg'$  of Apalutamide.   Chronic fatigue is slightly better.      Review of Systems  Constitutional:  Positive for malaise/fatigue. Negative for chills, fever and weight loss.  HENT:  Negative for sore throat.   Eyes:  Negative for redness.  Respiratory:  Positive for shortness of breath. Negative for cough and wheezing.   Cardiovascular:  Negative for chest pain, palpitations and leg swelling.  Gastrointestinal:  Negative for abdominal pain, blood in stool, nausea and vomiting.  Genitourinary:  Negative for dysuria.  Musculoskeletal:  Negative for myalgias.  Skin:  Negative for rash.  Neurological:  Negative for dizziness, tingling and tremors.  Endo/Heme/Allergies:  Does not bruise/bleed easily.  Psychiatric/Behavioral:  Negative for hallucinations. The patient has insomnia.     MEDICAL HISTORY:  Past Medical History:  Diagnosis Date   Arthritis    COPD (chronic obstructive pulmonary disease) (HCC)    Elevated PSA    Hematuria, gross    History of hiatal hernia    HOH (hard of hearing)    Kidney stone    Prostate cancer ([place])    Shortness of breath dyspnea     SURGICAL HISTORY: Past Surgical History:  Procedure Laterality Date   [place]  06/24/2015   Procedure: CYSTOSCOPY;  Surgeon: Hollice Espy, MD;  Location: [facility] ORS;  Service: Urology;;   HERNIA REPAIR     left and right inguinal hernia repair   RADIOACTIVE SEED IMPLANT N/A 06/24/2015   Procedure: RADIOACTIVE SEED IMPLANT/BRACHYTHERAPY IMPLANT;  Surgeon: Hollice Espy, MD;  Location: [facility] ORS;  Service:  Urology;  Laterality: N/A;    SOCIAL HISTORY: Social History   Socioeconomic History   Marital status: Widowed    Spouse name: Not on file   Number of children: 1   Years of education: Not on file   Highest education level: 8th grade  Occupational History   Occupation: retired  Tobacco Use   Smoking status: Former    Packs/day: 1.50    Types: Cigarettes    Quit date: 05/24/1981    Years since quitting: 41.5   Smokeless tobacco: Current    Types: Chew  Vaping Use   Vaping Use: Never used  Substance and Sexual Activity   Alcohol use: Not Currently    Alcohol/week: 0.0 standard drinks of alcohol   Drug use: No   Sexual activity: Not on file  Other Topics Concern   Not on file  Social History Narrative   Not on file   Social Determinants of Health   Financial Resource Strain: Low Risk  (08/26/2022)   Overall Financial Resource Strain (CARDIA)    Difficulty of Paying Living Expenses: Not hard at all  Food Insecurity: No Food Insecurity (08/26/2022)   Hunger Vital Sign    Worried About Running Out of Food in the Last Year: Never true    [place] in the Last Year: Never true  Transportation Needs: No Transportation Needs (08/26/2022)   PRAPARE - Hydrologist (Medical): No    Lack of Transportation (Non-Medical): No  Physical Activity: Inactive (09/19/2019)   Exercise Vital Sign    Days of Exercise per Week: 0 days    Minutes of Exercise per Session: 0 min  Stress: No Stress Concern Present (09/19/2019)   [place]    Feeling of Stress : Not at all  Social Connections: Socially Isolated (08/26/2022)   Social Connection and Isolation Panel [NHANES]    Frequency of Communication with Friends and Family: More than three times a week    Frequency of Social Gatherings with Friends and Family: More than three times a week    Attends Religious Services: Never    Marine scientist  or Organizations: No    Attends Archivist Meetings: Never    Marital Status: Widowed  Intimate Partner Violence: Not At Risk (08/26/2022)   Humiliation, Afraid, Rape, and Kick questionnaire    Fear of Current or Ex-Partner: No    Emotionally Abused: No    Physically Abused: No    Sexually Abused: No    FAMILY HISTORY: Family History  Problem Relation Age of Onset   Cancer Brother    Heart disease Brother    Diabetes Brother    Emphysema Brother    Prostate cancer Brother    Diabetes Sister    Emphysema Mother    Healthy Sister    Healthy Sister    Healthy Sister    Diabetes Brother    Diabetes Brother    Diabetes Brother    Healthy Brother     ALLERGIES:  is allergic to [employer] [lovastatin], niacin and related, statins, and aleve [naproxen sodium].  MEDICATIONS:  Current Outpatient Medications  Medication Sig Dispense Refill   albuterol (VENTOLIN HFA) 108 (90 Base) MCG/ACT inhaler Inhale 2 puffs into the lungs every 6 (six) hours as needed for wheezing or shortness of breath. 8 g 2   aspirin EC 81 MG tablet Take 81 mg by mouth daily. Swallow whole.     cholecalciferol (VITAMIN D3) 25 MCG (1000 UNIT) tablet Take 1,000 Units by mouth daily.     cyanocobalamin (VITAMIN B12) 1000 MCG tablet Take 1,000 mcg by mouth daily.     fluticasone-salmeterol (ADVAIR DISKUS) 250-50 MCG/ACT AEPB Inhale 1 puff into the lungs in the morning and at bedtime. 60 each 1   traZODone (DESYREL) 50 MG tablet Take 1 tablet (50 mg total) by mouth at bedtime as needed for sleep. 30 tablet 1   apalutamide (ERLEADA) 60 MG tablet Take 1 tablet (60 mg total) by mouth daily. 30 tablet 1   calcium-vitamin D (OSCAL WITH D) 500-5 MG-MCG tablet Take 2 tablets by mouth daily. (Patient not taking: Reported on 09/21/2022) 90 tablet 1   No current facility-administered medications for this visit.     PHYSICAL EXAMINATION: ECOG PERFORMANCE STATUS: 1 - Symptomatic but completely ambulatory Vitals:    12/08/22 1002  BP: (!) 143/69  Pulse: 78  Temp: (!) 96.2 F (35.7 C)  SpO2: 97%   Filed Weights   12/08/22 1002  Weight: 140 lb 4.8 oz (63.6 kg)     Physical Exam Constitutional:      General: He is not in acute distress.    Comments: Patient walks independently.  HENT:     Head: Normocephalic.  Eyes:     General: No  scleral icterus. Cardiovascular:     Rate and Rhythm: Normal rate and regular rhythm.     Heart sounds: Normal heart sounds.  Pulmonary:     Effort: Pulmonary effort is normal. No respiratory distress.     Breath sounds: No wheezing.     Comments: Decreased breath sound bilaterally Abdominal:     General: Bowel sounds are normal. There is no distension.     Palpations: Abdomen is soft.  Musculoskeletal:        General: No deformity. Normal range of motion.     Cervical back: Normal range of motion and neck supple.  Lymphadenopathy:     Cervical: No cervical adenopathy.  Skin:    General: Skin is warm and dry.     Findings: No erythema or rash.  Neurological:     Mental Status: He is alert and oriented to person, place, and time. Mental status is at baseline.     Cranial Nerves: No cranial nerve deficit.  Psychiatric:        Mood and Affect: Mood normal.      LABORATORY DATA:  I have reviewed the data as listed     Latest Ref Rng & Units 12/08/2022    9:46 AM 11/02/2022   12:36 PM 09/21/2022   12:38 PM  CBC  WBC 4.0 - 10.5 K/uL 4.6  5.0  4.3   Hemoglobin 13.0 - 17.0 g/dL 14.7  14.4  12.8   Hematocrit 39.0 - 52.0 % 45.3  45.4  39.8   Platelets 150 - 400 K/uL 180  213  215       Latest Ref Rng & Units 12/08/2022    9:46 AM 11/02/2022   12:36 PM 09/21/2022   12:38 PM  CMP  Glucose 70 - 99 mg/dL 128  102  146   BUN 8 - 23 mg/dL '25  19  16   '$ Creatinine 0.61 - 1.24 mg/dL 0.91  0.80  0.72   Sodium 135 - 145 mmol/L 142  139  141   Potassium 3.5 - 5.1 mmol/L 4.0  4.3  3.6   Chloride 98 - 111 mmol/L 104  100  104   CO2 22 - 32 mmol/L '30  31   31   '$ Calcium 8.9 - 10.3 mg/dL 9.4  9.5  8.9   Total Protein 6.5 - 8.1 g/dL 7.8  7.8  7.2   Total Bilirubin 0.3 - 1.2 mg/dL 0.4  0.4  0.2   Alkaline Phos 38 - 126 U/L 80  95  108   AST 15 - 41 U/L '16  18  17   '$ ALT 0 - 44 U/L '8  7  7      '$ RADIOGRAPHIC STUDIES: I have personally reviewed the radiological images as listed and agreed with the findings in the report. No results found.

## 2022-12-09 NOTE — Progress Notes (Signed)
PATIENT NAME: Sean Wood. DOB: 04/20/1936 MRN: [id number]  PRIMARY CARE PROVIDER: Margo Common, [name] (Inactive)  RESPONSIBLE PARTY:  Acct ID - Guarantor Home Phone Work Phone Relationship Acct Type  [id number] [name], [name]  Self P/F     [address], [place], [place] [address]   Fatigue:  Ongoing despite decrease in Erleada to 1 tab daily.  Eligard every 6 months.   Pain:  None reported.  Shortness of breath: Patient visibly short of breath ambulating short distances.  Patient also reports shortness of breath leaning forward.  Ambulated patient throughout home to ensure oxygen levels did not drop and they are remaining in the upper 90's with exertion.  Safety:  Has a life alert but not wearing.  We discussed placing this on when he awakens at the same time he puts his watch on.  Patient advised he would try to keep life alert on him especially since he still drives and is walking his property on nice days.   Weight Loss:  3 lb weight loss noted over the last month.  Patient denies any changes in diet.  Continues with breakfast and dinner.  Sometimes will have a light snack for lunch.     CODE STATUS: DNR-form in the home. ADVANCED DIRECTIVES: No MOST FORM: Yes PPS: 50%   PHYSICAL EXAM:   VITALS: Today's Vitals   12/09/22 1009  BP: 130/76  Pulse: 74  Temp: (!) 97.5 F (36.4 C)  SpO2: 94%    LUNGS: clear to auscultation , decreased breath sounds CARDIAC: Cor RRR}  EXTREMITIES: right ankle edema present. SKIN: Skin color, texture, turgor normal. No rashes or lesions or mobility and turgor normal  NEURO: negative       Lorenza Burton, RN

## 2022-12-29 ENCOUNTER — Other Ambulatory Visit (HOSPITAL_COMMUNITY): Payer: Self-pay

## 2022-12-29 ENCOUNTER — Telehealth: Payer: Self-pay

## 2022-12-29 ENCOUNTER — Encounter: Payer: Self-pay | Admitting: Oncology

## 2022-12-29 NOTE — Telephone Encounter (Signed)
Oral Oncology Patient Advocate Encounter   Was successful in securing patient a $3,500 grant from [employer] (PAF) to provide copayment coverage for Erleada.  This will keep the out of pocket expense at $0.     I have spoken with the patient.    The billing information is as follows and has been shared with [place].   RxBin: [id number] PCN:  [id number] Member ID: [id number] Group ID: [id number] Dates of Eligibility: 06/26/22 through 12/23/23  Sean Wood, [facility] Patient [place]  [phone] (phone) [phone] (fax) 12/29/2022 2:36 PM

## 2022-12-30 DIAGNOSIS — H2512 Age-related nuclear cataract, left eye: Secondary | ICD-10-CM | POA: Diagnosis not present

## 2022-12-30 DIAGNOSIS — H25013 Cortical age-related cataract, bilateral: Secondary | ICD-10-CM | POA: Diagnosis not present

## 2022-12-30 DIAGNOSIS — H2513 Age-related nuclear cataract, bilateral: Secondary | ICD-10-CM | POA: Diagnosis not present

## 2022-12-30 DIAGNOSIS — H18413 Arcus senilis, bilateral: Secondary | ICD-10-CM | POA: Diagnosis not present

## 2022-12-30 DIAGNOSIS — H25043 Posterior subcapsular polar age-related cataract, bilateral: Secondary | ICD-10-CM | POA: Diagnosis not present

## 2023-01-05 ENCOUNTER — Other Ambulatory Visit: Payer: PPO

## 2023-01-05 VITALS — BP 142/82 | HR 76 | Temp 97.6°F

## 2023-01-05 DIAGNOSIS — Z515 Encounter for palliative care: Secondary | ICD-10-CM

## 2023-01-05 NOTE — Progress Notes (Signed)
PATIENT NAME: Sean Wood. DOB: [birth year] MRN: [id number]  PRIMARY CARE PROVIDER: Margo Common, [name] (Inactive)  RESPONSIBLE PARTY:  Acct ID - Guarantor Home Phone Work Phone Relationship Acct Type  [id number] ZADIEL, [phone]  Self P/F     [address], [place], [place] [address]   Visit completed with patient in the home.  Appetite:  Reports eating 2 meals on average typically breakfast and dinner.  We discussed eating a small lunch such as a 1/2 sandwich to avoid further weight loss.    Fatigue:  Patient reports improvement in fatigue since decrease in Apalutamide.  Able to do more activities during the day.  Insomnia: Currently out of trazodone.  He has been taking 1/2 tab nightly which is helpful.  Phone call made to [facility] and spoke with [place].  Patient has 1 refill left and it will be filled today.   Pain:  no complaints of pain.  Shortness of breath:  Continues with exertion.  Used albuterol inhaler yesterday.  Occasional productive cough.   Visit scheduled for 02/02/23 @ 930 am.   CODE STATUS: DNR ADVANCED DIRECTIVES:  MOST FORM: Yes PPS: 50%   PHYSICAL EXAM:   VITALS: Today's Vitals   01/05/23 0938  BP: (!) 142/82  Pulse: 76  Temp: 97.6 F (36.4 C)  SpO2: 95%  PainSc: 0-No pain    LUNGS: decreased breath sounds CARDIAC: Cor RRR}  EXTREMITIES: trace edema to left ankle. SKIN: Skin color, texture, turgor normal. No rashes or lesions or mobility and turgor normal  NEURO: no current issues reported.        Lorenza Burton, RN

## 2023-01-19 ENCOUNTER — Inpatient Hospital Stay (HOSPITAL_BASED_OUTPATIENT_CLINIC_OR_DEPARTMENT_OTHER): Payer: PPO | Admitting: Oncology

## 2023-01-19 ENCOUNTER — Encounter: Payer: Self-pay | Admitting: Oncology

## 2023-01-19 ENCOUNTER — Other Ambulatory Visit (HOSPITAL_COMMUNITY): Payer: Self-pay

## 2023-01-19 ENCOUNTER — Inpatient Hospital Stay: Payer: PPO | Attending: Oncology

## 2023-01-19 VITALS — BP 138/80 | HR 72 | Temp 95.1°F | Resp 18 | Wt 140.8 lb

## 2023-01-19 DIAGNOSIS — G4709 Other insomnia: Secondary | ICD-10-CM

## 2023-01-19 DIAGNOSIS — J439 Emphysema, unspecified: Secondary | ICD-10-CM | POA: Diagnosis not present

## 2023-01-19 DIAGNOSIS — R5383 Other fatigue: Secondary | ICD-10-CM

## 2023-01-19 DIAGNOSIS — J438 Other emphysema: Secondary | ICD-10-CM | POA: Diagnosis not present

## 2023-01-19 DIAGNOSIS — Z87891 Personal history of nicotine dependence: Secondary | ICD-10-CM | POA: Insufficient documentation

## 2023-01-19 DIAGNOSIS — C61 Malignant neoplasm of prostate: Secondary | ICD-10-CM | POA: Insufficient documentation

## 2023-01-19 DIAGNOSIS — Z809 Family history of malignant neoplasm, unspecified: Secondary | ICD-10-CM | POA: Diagnosis not present

## 2023-01-19 DIAGNOSIS — Z8042 Family history of malignant neoplasm of prostate: Secondary | ICD-10-CM | POA: Insufficient documentation

## 2023-01-19 DIAGNOSIS — Z79899 Other long term (current) drug therapy: Secondary | ICD-10-CM | POA: Diagnosis not present

## 2023-01-19 DIAGNOSIS — G4700 Insomnia, unspecified: Secondary | ICD-10-CM | POA: Insufficient documentation

## 2023-01-19 LAB — CBC WITH DIFFERENTIAL/PLATELET
Abs Immature Granulocytes: 0.01 10*3/uL (ref 0.00–0.07)
Basophils Absolute: 0 10*3/uL (ref 0.0–0.1)
Basophils Relative: 0 %
Eosinophils Absolute: 0 10*3/uL (ref 0.0–0.5)
Eosinophils Relative: 0 %
HCT: 43 % (ref 39.0–52.0)
Hemoglobin: 13.7 g/dL (ref 13.0–17.0)
Immature Granulocytes: 0 %
Lymphocytes Relative: 44 %
Lymphs Abs: 2.1 10*3/uL (ref 0.7–4.0)
MCH: 27.7 pg (ref 26.0–34.0)
MCHC: 31.9 g/dL (ref 30.0–36.0)
MCV: 87 fL (ref 80.0–100.0)
Monocytes Absolute: 0.7 10*3/uL (ref 0.1–1.0)
Monocytes Relative: 14 %
Neutro Abs: 2 10*3/uL (ref 1.7–7.7)
Neutrophils Relative %: 42 %
Platelets: 203 10*3/uL (ref 150–400)
RBC: 4.94 MIL/uL (ref 4.22–5.81)
RDW: 13.6 % (ref 11.5–15.5)
WBC: 4.9 10*3/uL (ref 4.0–10.5)
nRBC: 0 % (ref 0.0–0.2)

## 2023-01-19 LAB — COMPREHENSIVE METABOLIC PANEL
ALT: 7 U/L (ref 0–44)
AST: 17 U/L (ref 15–41)
Albumin: 4.1 g/dL (ref 3.5–5.0)
Alkaline Phosphatase: 87 U/L (ref 38–126)
Anion gap: 10 (ref 5–15)
BUN: 17 mg/dL (ref 8–23)
CO2: 29 mmol/L (ref 22–32)
Calcium: 8.8 mg/dL — ABNORMAL LOW (ref 8.9–10.3)
Chloride: 99 mmol/L (ref 98–111)
Creatinine, Ser: 0.87 mg/dL (ref 0.61–1.24)
GFR, Estimated: >60 mL/min (ref >60)
Glucose, Bld: 179 mg/dL — ABNORMAL HIGH (ref 70–99)
Potassium: 4.1 mmol/L (ref 3.5–5.1)
Sodium: 138 mmol/L (ref 135–145)
Total Bilirubin: 0.3 mg/dL (ref 0.3–1.2)
Total Protein: 7.3 g/dL (ref 6.5–8.1)

## 2023-01-19 LAB — PSA: Prostatic Specific Antigen: 0.61 ng/mL (ref 0.00–4.00)

## 2023-01-19 MED ORDER — TRAZODONE HCL 50 MG PO TABS: 50.0000 mg | ORAL_TABLET | Freq: Every evening | ORAL | 2 refills | Status: DC | PRN

## 2023-01-19 MED ORDER — APALUTAMIDE 60 MG PO TABS
60.0000 mg | ORAL_TABLET | Freq: Every day | ORAL | 3 refills | Status: DC
  Filled 2023-01-19 – 2023-01-26 (×2): qty 30, 30d supply, fill #0
  Filled 2023-02-23: qty 30, 30d supply, fill #1

## 2023-01-19 NOTE — Progress Notes (Signed)
Hematology/Oncology Progress note [phone] [phone]      Patient Care Team: Chrismon, Vickki Muff, [name] (Inactive) as PCP - General (Physician Assistant) Garrel Ridgel, DPM as Consulting Physician (Podiatry) Noreene Filbert, MD as Referring Physician (Radiation Oncology) Idelle Leech, OD as Consulting Physician (Optometry) Desman Server, MD as Consulting Physician (Oncology) Valente David, RN as [place] [facility]   ASSESSMENT & PLAN:   Cancer Staging  No matching staging information was found for the patient.  Malignant neoplasm of prostate [facility]) Prostate Cancer with biochemical recurrence, asymptomatic, castration resistant nonmetastatic.  Previous PSMA PET scan showed elevated SUV in the prostate/prostate bed, consider future reestablishment of Radonc for evaluation of additional radiation. Continue ADT plan with Eligard '45mg'$  Q6 months - next due April 2024 Labs reviewed and discussed with patient. He did not tolerate Apalutamide standard dose. I have recommend '120mg'$  daily, but he can only take '60mg'$  daily.  PSA is stable.  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HCC) COPD/Emphysema, recommend smoke cessation.  Patient declines pulmonology follow-ups.  He does not actively follow-up with PCP Continue albuterol as needed for wheezing/shortness of breath, Advair  twice daily-patient admits not being very compliant. I encourage patient to increase compliance and make a follow-up with appointment with pulmonology.  Insomnia Recommend trazodone 50 mg nightly as needed. .  Other fatigue Multifactorial, medication side effects, hypoxia secondary to COPD/emphysema, prolonged grieving/depression Recommend empiric B12 and Vitamin D supplementation.  Symptom has improved.  Orders Placed This Encounter  Procedures   CBC with Differential/Platelet    Standing Status:   Future    Standing Expiration Date:   01/20/2024   Comprehensive metabolic  panel    Standing Status:   Future    Standing Expiration Date:   01/19/2024   PSA    Standing Status:   Future    Standing Expiration Date:   01/20/2024    Follow up  3 months  All questions were answered. The patient knows to call the clinic with any problems, questions or concerns.  Sean Server, MD, PhD [facility] Hematology Oncology 01/19/2023    HISTORY OF PRESENTING ILLNESS:  Sean Wood. is a  87 y.o.  male with PMH listed below who was referred to me for evaluation of prostate cancer.   Cancer history dated back to 2009 when He was diagnosed with intermediate risk, Gleason 3+4, T1c prostate cancer in 2009. PSA at diagnosis was 6.1. IMRT was completed in August 2009. PSA nadir was 0.5. His PSA January 2013 was 0.8 and in January 2014 was 1.3. This was repeated in May 2014 and had increased to 1.8. He elected a repeat prostate biopsy in November 2014 after his PSA in October had increased to 2.2. This showed a focus of Gleason 3+3 adenocarcinoma from the right prostate involving less than 5%. He elected surveillance after discussing options. In May 2016, he was evaluated by Dr.Chrystal and he received salvage seed implantation with I-125. He subsequently follows with Dr.Chrystal for Lupron every 4 months since then. Achieve PSA nadir of 0.06 in 09/2016, and started to trend up and most recently trended up to 9.64.   # Lupron 30 mg on 05/10/2018, 06/14/2018 testosterone level was at 6  # Leupron 22.'5mg'$  11/06/2018.  Delayed as patient reports feeling very fatigued and we discussed about holding Lupron.  1-monthand restarted on 11/06/2018.  10/06/2021, PSMA PET scan showed marked radiotracer accumulation with elevated SUV in the prostate/prostate bed tracking towards the bladder base  with distortion of the urinary bladder.  Signs of brachytherapy in the prostate as well.  No definitive signs of extraprostatic disease.  A single lymph node in the left the pelvis with only minimally  elevated SUV.  Equivocal.  Aortic atherosclerosis.  10/20/2021 Darolutamide '600mg'$  BID 01/01/2022, decreased to Darolutamide '300mg'$  BID 06/10/2022 Darolutamide '300mg'$  BID held 07/27/2022, started on apalutamide 240 mg daily. INTERVAL HISTORY Sean Wood. is a 87 y.o. male who has above history reviewed by me present for follow-up of management of biochemical recurrence of prostate cancer  Patient was accompanied by his daughter. He is only taking '60mg'$  of Apalutamide.   Chronic fatigue is slightly better.  Patient takes trazodone 50 mg nightly as needed.     Review of Systems  Constitutional:  Positive for malaise/fatigue. Negative for chills, fever and weight loss.  HENT:  Negative for sore throat.   Eyes:  Negative for redness.  Respiratory:  Positive for shortness of breath. Negative for cough and wheezing.   Cardiovascular:  Negative for chest pain, palpitations and leg swelling.  Gastrointestinal:  Negative for abdominal pain, blood in stool, nausea and vomiting.  Genitourinary:  Negative for dysuria.  Musculoskeletal:  Negative for myalgias.  Skin:  Negative for rash.  Neurological:  Negative for dizziness, tingling and tremors.  Endo/Heme/Allergies:  Does not bruise/bleed easily.  Psychiatric/Behavioral:  Negative for hallucinations. The patient has insomnia.     MEDICAL HISTORY:  Past Medical History:  Diagnosis Date   Arthritis    COPD (chronic obstructive pulmonary disease) (HCC)    Elevated PSA    Hematuria, gross    History of hiatal hernia    HOH (hard of hearing)    Kidney stone    Prostate cancer ([place])    Shortness of breath dyspnea     SURGICAL HISTORY: Past Surgical History:  Procedure Laterality Date   [place]  06/24/2015   Procedure: CYSTOSCOPY;  Surgeon: Hollice Espy, MD;  Location: [facility] ORS;  Service: Urology;;   HERNIA REPAIR     left and right inguinal hernia repair   RADIOACTIVE SEED IMPLANT N/A 06/24/2015    Procedure: RADIOACTIVE SEED IMPLANT/BRACHYTHERAPY IMPLANT;  Surgeon: Hollice Espy, MD;  Location: [facility] ORS;  Service: Urology;  Laterality: N/A;    SOCIAL HISTORY: Social History   Socioeconomic History   Marital status: Widowed    Spouse name: Not on file   Number of children: 1   Years of education: Not on file   Highest education level: 8th grade  Occupational History   Occupation: retired  Tobacco Use   Smoking status: Former    Packs/day: 1.50    Types: Cigarettes    Quit date: 05/24/1981    Years since quitting: 41.6   Smokeless tobacco: Current    Types: Chew  Vaping Use   Vaping Use: Never used  Substance and Sexual Activity   Alcohol use: Not Currently    Alcohol/week: 0.0 standard drinks of alcohol   Drug use: No   Sexual activity: Not on file  Other Topics Concern   Not on file  Social History Narrative   Not on file   Social Determinants of Health   Financial Resource Strain: Low Risk  (08/26/2022)   Overall Financial Resource Strain (CARDIA)    Difficulty of Paying Living Expenses: Not hard at all  Food Insecurity: No Food Insecurity (08/26/2022)   Hunger Vital Sign    Worried About Running Out of Food  in the Last Year: Never true    [place] in the Last Year: Never true  Transportation Needs: No Transportation Needs (08/26/2022)   PRAPARE - Hydrologist (Medical): No    Lack of Transportation (Non-Medical): No  Physical Activity: Inactive (09/19/2019)   Exercise Vital Sign    Days of Exercise per Week: 0 days    Minutes of Exercise per Session: 0 min  Stress: No Stress Concern Present (09/19/2019)   [place]    Feeling of Stress : Not at all  Social Connections: Socially Isolated (08/26/2022)   Social Connection and Isolation Panel [NHANES]    Frequency of Communication with Friends and Family: More than three times a week    Frequency of Social  Gatherings with Friends and Family: More than three times a week    Attends Religious Services: Never    Marine scientist or Organizations: No    Attends Archivist Meetings: Never    Marital Status: Widowed  Intimate Partner Violence: Not At Risk (08/26/2022)   Humiliation, Afraid, Rape, and Kick questionnaire    Fear of Current or Ex-Partner: No    Emotionally Abused: No    Physically Abused: No    Sexually Abused: No    FAMILY HISTORY: Family History  Problem Relation Age of Onset   Cancer Brother    Heart disease Brother    Diabetes Brother    Emphysema Brother    Prostate cancer Brother    Diabetes Sister    Emphysema Mother    Healthy Sister    Healthy Sister    Healthy Sister    Diabetes Brother    Diabetes Brother    Diabetes Brother    Healthy Brother     ALLERGIES:  is allergic to [employer] [lovastatin], niacin and related, statins, and aleve [naproxen sodium].  MEDICATIONS:  Current Outpatient Medications  Medication Sig Dispense Refill   albuterol (VENTOLIN HFA) 108 (90 Base) MCG/ACT inhaler Inhale 2 puffs into the lungs every 6 (six) hours as needed for wheezing or shortness of breath. 8 g 2   aspirin EC 81 MG tablet Take 81 mg by mouth daily. Swallow whole.     cholecalciferol (VITAMIN D3) 25 MCG (1000 UNIT) tablet Take 1,000 Units by mouth daily.     cyanocobalamin (VITAMIN B12) 1000 MCG tablet Take 1,000 mcg by mouth daily.     fluticasone-salmeterol (ADVAIR DISKUS) 250-50 MCG/ACT AEPB Inhale 1 puff into the lungs in the morning and at bedtime. 60 each 1   apalutamide (ERLEADA) 60 MG tablet Take 1 tablet (60 mg total) by mouth daily. 30 tablet 3   calcium-vitamin D (OSCAL WITH D) 500-5 MG-MCG tablet Take 2 tablets by mouth daily. (Patient not taking: Reported on 09/21/2022) 90 tablet 1   traZODone (DESYREL) 50 MG tablet Take 1 tablet (50 mg total) by mouth at bedtime as needed for sleep. 30 tablet 2   No current facility-administered  medications for this visit.     PHYSICAL EXAMINATION: ECOG PERFORMANCE STATUS: 1 - Symptomatic but completely ambulatory Vitals:   01/19/23 1337  BP: 138/80  Pulse: 72  Resp: 18  Temp: (!) 95.1 F (35.1 C)  SpO2: 97%   Filed Weights   01/19/23 1337  Weight: 140 lb 12.8 oz (63.9 kg)     Physical Exam Constitutional:      General: He is not in acute distress.  Comments: Patient walks independently.  HENT:     Head: Normocephalic.  Eyes:     General: No scleral icterus. Cardiovascular:     Rate and Rhythm: Normal rate and regular rhythm.     Heart sounds: Normal heart sounds.  Pulmonary:     Effort: Pulmonary effort is normal. No respiratory distress.     Breath sounds: No wheezing.     Comments: Decreased breath sound bilaterally Abdominal:     General: Bowel sounds are normal. There is no distension.     Palpations: Abdomen is soft.  Musculoskeletal:        General: No deformity. Normal range of motion.     Cervical back: Normal range of motion and neck supple.  Lymphadenopathy:     Cervical: No cervical adenopathy.  Skin:    General: Skin is warm and dry.     Findings: No erythema or rash.  Neurological:     Mental Status: He is alert and oriented to person, place, and time. Mental status is at baseline.     Cranial Nerves: No cranial nerve deficit.  Psychiatric:        Mood and Affect: Mood normal.      LABORATORY DATA:  I have reviewed the data as listed     Latest Ref Rng & Units 01/19/2023    1:27 PM 12/08/2022    9:46 AM 11/02/2022   12:36 PM  CBC  WBC 4.0 - 10.5 K/uL 4.9  4.6  5.0   Hemoglobin 13.0 - 17.0 g/dL 13.7  14.7  14.4   Hematocrit 39.0 - 52.0 % 43.0  45.3  45.4   Platelets 150 - 400 K/uL 203  180  213       Latest Ref Rng & Units 01/19/2023    1:27 PM 12/08/2022    9:46 AM 11/02/2022   12:36 PM  CMP  Glucose 70 - 99 mg/dL 179  128  102   BUN 8 - 23 mg/dL '17  25  19   '$ Creatinine 0.61 - 1.24 mg/dL 0.87  0.91  0.80   Sodium  135 - 145 mmol/L 138  142  139   Potassium 3.5 - 5.1 mmol/L 4.1  4.0  4.3   Chloride 98 - 111 mmol/L 99  104  100   CO2 22 - 32 mmol/L '29  30  31   '$ Calcium 8.9 - 10.3 mg/dL 8.8  9.4  9.5   Total Protein 6.5 - 8.1 g/dL 7.3  7.8  7.8   Total Bilirubin 0.3 - 1.2 mg/dL 0.3  0.4  0.4   Alkaline Phos 38 - 126 U/L 87  80  95   AST 15 - 41 U/L '17  16  18   '$ ALT 0 - 44 U/L '7  8  7      '$ RADIOGRAPHIC STUDIES: I have personally reviewed the radiological images as listed and agreed with the findings in the report. No results found.

## 2023-01-19 NOTE — Assessment & Plan Note (Signed)
Prostate Cancer with biochemical recurrence, asymptomatic, castration resistant nonmetastatic.  Previous PSMA PET scan showed elevated SUV in the prostate/prostate bed, consider future reestablishment of Radonc for evaluation of additional radiation. Continue ADT plan with Eligard '45mg'$  Q6 months - next due April 2024 Labs reviewed and discussed with patient. He did not tolerate Apalutamide standard dose. I have recommend '120mg'$  daily, but he can only take '60mg'$  daily.  PSA is stable.

## 2023-01-19 NOTE — Assessment & Plan Note (Signed)
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  Symptom has improved.

## 2023-01-19 NOTE — Assessment & Plan Note (Signed)
COPD/Emphysema, recommend smoke cessation.  Patient declines pulmonology follow-ups.  He does not actively follow-up with PCP Continue albuterol as needed for wheezing/shortness of breath, Advair  twice daily-patient admits not being very compliant. I encourage patient to increase compliance and make a follow-up with appointment with pulmonology.

## 2023-01-19 NOTE — Assessment & Plan Note (Signed)
Recommend trazodone 50 mg nightly as needed. Marland Kitchen

## 2023-01-21 ENCOUNTER — Other Ambulatory Visit (HOSPITAL_COMMUNITY): Payer: Self-pay

## 2023-01-24 ENCOUNTER — Other Ambulatory Visit: Payer: Self-pay

## 2023-01-26 ENCOUNTER — Other Ambulatory Visit (HOSPITAL_COMMUNITY): Payer: Self-pay

## 2023-02-01 ENCOUNTER — Other Ambulatory Visit (HOSPITAL_COMMUNITY): Payer: Self-pay

## 2023-02-02 ENCOUNTER — Other Ambulatory Visit: Payer: PPO

## 2023-02-02 VITALS — BP 124/76 | HR 70 | Temp 97.5°F

## 2023-02-02 DIAGNOSIS — Z515 Encounter for palliative care: Secondary | ICD-10-CM

## 2023-02-02 NOTE — Progress Notes (Signed)
PATIENT NAME: Sean Wood. DOB: 04-Jul-1936 MRN: [id number]  PRIMARY CARE PROVIDER: Margo Common, [name] (Inactive)  RESPONSIBLE PARTY:  Acct ID - Guarantor Home Phone Work Phone Relationship Acct Type  [id number] [name], [name]  Self P/F     [place], [place], [place] [address]   Home visit completed with patient.  Cataract Removal:  Patient is scheduled for removal March 27 and April 10.   Daughter-in-law will be taking patient to and from appointment.  Patient states he is not currently driving at night due to vision concerns.   Fatigue:  Ongoing but some improved.  Patient resting during the day.   Grief:  Patient continues to process through the loss of his wife.  June will be 1 year since her passing.  Patient talks about their life together and many memories shared.  He often goes to the cemetery to visit her.   Mobility:  Independent with ambulation.  No recent falls reported.   Continues to live home alone with family checking in home him routinely.  Weight:  Most recent weight 140 lbs.  Patient continues to eat 2 meals daily.  Mostly skipping lunch.  Discussed adding a supplement but patient is not interested at this time.   12/04/2021 weight 142 lbs 06/10/2022 weight 143.9 lbs 07/26/2022 weight 140.8 lbs 09/21/2022 weight 145 lbs 01/19/23 weight 140 lbs    CODE STATUS: DNR ADVANCED DIRECTIVES: No MOST FORM: Yes PPS: 60%   PHYSICAL EXAM:   VITALS: Today's Vitals   02/02/23 0945  BP: 124/76  Pulse: 70  Temp: (!) 97.5 F (36.4 C)  SpO2: 94%    LUNGS: decreased breath sounds CARDIAC: Cor RRR}  EXTREMITIES: - for edema SKIN: Skin color, texture, turgor normal. No rashes or lesions or normal  NEURO: positive for no issues reported.       Lorenza Burton, RN

## 2023-02-23 ENCOUNTER — Other Ambulatory Visit (HOSPITAL_COMMUNITY): Payer: Self-pay

## 2023-02-24 ENCOUNTER — Other Ambulatory Visit (HOSPITAL_COMMUNITY): Payer: Self-pay

## 2023-03-02 ENCOUNTER — Other Ambulatory Visit: Payer: PPO

## 2023-03-02 VITALS — BP 120/76 | HR 77 | Temp 97.6°F

## 2023-03-02 DIAGNOSIS — Z515 Encounter for palliative care: Secondary | ICD-10-CM

## 2023-03-02 NOTE — Progress Notes (Signed)
PATIENT NAME: Sean Wood. DOB: 1936-04-28 MRN: [id number]  PRIMARY CARE PROVIDER: Margo Common, [name] (Inactive)  RESPONSIBLE PARTY:  Acct ID - Guarantor Home Phone Work Phone Relationship Acct Type  [id number] Sean Wood, Sean [phone]  Self P/F     [place], [place], [place] [address]   Home visit completed with patient.   Cataract Removal: Patient is scheduled for removal March 27 and April 10. Daughter-in-law will be taking patient to and from appointment. Patient states he is not currently driving at night due to vision concerns.   Grief:  Patient verbalized feelings of loneliness.  Feels his home is quiet and empty since his wife passed away. April 04, 2023 will be the 1 year anniversary of his wife's passing.  Patient continues to do weekly visits to the [place].  Allowed space for patient to share memories of his wife.  We again discussed grief counseling but patient has declined.   Follow up visit scheduled for 04/06/23 @ 9 am.   CODE STATUS: DNR ADVANCED DIRECTIVES: No MOST FORM: Yes PPS: 60%   PHYSICAL EXAM:   VITALS: Today's Vitals   03/02/23 0937  BP: 120/76  Pulse: 77  Temp: 97.6 F (36.4 C)  SpO2: 97%    LUNGS: decreased breath sounds CARDIAC: Cor RRR}  EXTREMITIES: - for edema SKIN: Skin color, texture, turgor normal. No rashes or lesions or mobility and turgor normal  NEURO: negative for dizziness, headaches, memory problems, and weakness       Lorenza Burton, RN

## 2023-03-16 DIAGNOSIS — H25012 Cortical age-related cataract, left eye: Secondary | ICD-10-CM | POA: Diagnosis not present

## 2023-03-16 DIAGNOSIS — H2512 Age-related nuclear cataract, left eye: Secondary | ICD-10-CM | POA: Diagnosis not present

## 2023-03-17 DIAGNOSIS — H25041 Posterior subcapsular polar age-related cataract, right eye: Secondary | ICD-10-CM | POA: Diagnosis not present

## 2023-03-17 DIAGNOSIS — H25011 Cortical age-related cataract, right eye: Secondary | ICD-10-CM | POA: Diagnosis not present

## 2023-03-17 DIAGNOSIS — H2511 Age-related nuclear cataract, right eye: Secondary | ICD-10-CM | POA: Diagnosis not present

## 2023-03-23 ENCOUNTER — Other Ambulatory Visit (HOSPITAL_COMMUNITY): Payer: Self-pay

## 2023-03-30 DIAGNOSIS — H25011 Cortical age-related cataract, right eye: Secondary | ICD-10-CM | POA: Diagnosis not present

## 2023-03-30 DIAGNOSIS — H2511 Age-related nuclear cataract, right eye: Secondary | ICD-10-CM | POA: Diagnosis not present

## 2023-04-05 ENCOUNTER — Telehealth: Payer: Self-pay

## 2023-04-05 NOTE — Telephone Encounter (Signed)
345 pm.  Message left for patient regarding appointment time change for tomorrow due to a mandatory meeting.   Will see patient at 12 pm instead of 9 am.

## 2023-04-06 ENCOUNTER — Other Ambulatory Visit: Payer: PPO

## 2023-04-06 ENCOUNTER — Other Ambulatory Visit: Payer: Self-pay

## 2023-04-08 ENCOUNTER — Other Ambulatory Visit: Payer: PPO

## 2023-04-08 VITALS — BP 140/82 | HR 76 | Temp 97.3°F

## 2023-04-08 DIAGNOSIS — Z515 Encounter for palliative care: Secondary | ICD-10-CM

## 2023-04-08 NOTE — Progress Notes (Signed)
PATIENT NAME: Sean Wood. DOB: 1936/03/04 MRN: [id number]  PRIMARY CARE PROVIDER: Tamsen Roers, [name] (Inactive)  RESPONSIBLE PARTY:  Acct ID - Guarantor Home Phone Work Phone Relationship Acct Type  [id number] MASUD, HOLUB* [phone]  Self P/F     [address], [place], [place] [address]   Home visit completed with patient.   Cataract Surgery:  Bilateral cataract surgery completed.  No complications reported.  Patient will follow up again next month for dilation.  May need reading glasses but otherwise vision has improved.  Fatigue:   Ongoing.  Patient not sleeping well.  Was previously taking trazodone but has stopped this as it is not effective.   Grief: Patient continues to process through the loss of his wife.  This month is the one year anniversary of her passing.  Prostate Ca: Patient has follow up visit with Dr. Cathie Hoops next week.  Will have blood work completed prior to MD visit.  Continues with  of Apalutamide.  Shortness of breath:  Dyspnea present at rest. Patient endorses being outside most of yesterday doing yard work.  Discussed Advair and albuterol inhalers.  Patient is not consistently taking Advair.  Education provided on purpose of inhalers and encouraged taking Advair bid as directed.  If symptoms worsen contact provider.     CODE STATUS: DNR ADVANCED DIRECTIVES: No MOST FORM: Yes PPS: 50%   PHYSICAL EXAM:   VITALS: Today's Vitals   04/08/23 0834  BP: (!) 140/82  Pulse: 76  Temp: (!) 97.3 F (36.3 C)  SpO2: 94%    LUNGS: clear to auscultation  CARDIAC: Cor RRR}  EXTREMITIES: - for edema SKIN: Skin color, texture, turgor normal. No rashes or lesions or normal  NEURO: negative for dizziness, gait problems, and memory problems       Truitt Merle, RN

## 2023-04-14 ENCOUNTER — Inpatient Hospital Stay: Payer: PPO | Attending: Oncology

## 2023-04-14 ENCOUNTER — Other Ambulatory Visit (HOSPITAL_COMMUNITY): Payer: Self-pay

## 2023-04-14 ENCOUNTER — Inpatient Hospital Stay (HOSPITAL_BASED_OUTPATIENT_CLINIC_OR_DEPARTMENT_OTHER): Payer: PPO | Admitting: Oncology

## 2023-04-14 ENCOUNTER — Other Ambulatory Visit: Payer: Self-pay

## 2023-04-14 ENCOUNTER — Encounter: Payer: Self-pay | Admitting: Oncology

## 2023-04-14 ENCOUNTER — Inpatient Hospital Stay: Payer: PPO

## 2023-04-14 VITALS — BP 139/61 | HR 69 | Temp 96.3°F | Resp 18 | Ht 69.0 in | Wt 140.9 lb

## 2023-04-14 DIAGNOSIS — R5383 Other fatigue: Secondary | ICD-10-CM | POA: Diagnosis not present

## 2023-04-14 DIAGNOSIS — Z79899 Other long term (current) drug therapy: Secondary | ICD-10-CM | POA: Diagnosis not present

## 2023-04-14 DIAGNOSIS — C61 Malignant neoplasm of prostate: Secondary | ICD-10-CM | POA: Diagnosis not present

## 2023-04-14 DIAGNOSIS — G4709 Other insomnia: Secondary | ICD-10-CM

## 2023-04-14 DIAGNOSIS — J438 Other emphysema: Secondary | ICD-10-CM

## 2023-04-14 DIAGNOSIS — Z5111 Encounter for antineoplastic chemotherapy: Secondary | ICD-10-CM | POA: Insufficient documentation

## 2023-04-14 LAB — CBC WITH DIFFERENTIAL/PLATELET
Abs Immature Granulocytes: 0.01 10*3/uL (ref 0.00–0.07)
Basophils Absolute: 0 10*3/uL (ref 0.0–0.1)
Basophils Relative: 0 %
Eosinophils Absolute: 0 10*3/uL (ref 0.0–0.5)
Eosinophils Relative: 0 %
HCT: 41.5 % (ref 39.0–52.0)
Hemoglobin: 13.4 g/dL (ref 13.0–17.0)
Immature Granulocytes: 0 %
Lymphocytes Relative: 44 %
Lymphs Abs: 2.2 10*3/uL (ref 0.7–4.0)
MCH: 28.6 pg (ref 26.0–34.0)
MCHC: 32.3 g/dL (ref 30.0–36.0)
MCV: 88.5 fL (ref 80.0–100.0)
Monocytes Absolute: 0.7 10*3/uL (ref 0.1–1.0)
Monocytes Relative: 15 %
Neutro Abs: 2.1 10*3/uL (ref 1.7–7.7)
Neutrophils Relative %: 41 %
Platelets: 219 10*3/uL (ref 150–400)
RBC: 4.69 MIL/uL (ref 4.22–5.81)
RDW: 13.8 % (ref 11.5–15.5)
WBC: 5.1 10*3/uL (ref 4.0–10.5)
nRBC: 0 % (ref 0.0–0.2)

## 2023-04-14 LAB — COMPREHENSIVE METABOLIC PANEL
ALT: 8 U/L (ref 0–44)
AST: 18 U/L (ref 15–41)
Albumin: 4.1 g/dL (ref 3.5–5.0)
Alkaline Phosphatase: 75 U/L (ref 38–126)
Anion gap: 9 (ref 5–15)
BUN: 18 mg/dL (ref 8–23)
CO2: 29 mmol/L (ref 22–32)
Calcium: 8.7 mg/dL — ABNORMAL LOW (ref 8.9–10.3)
Chloride: 102 mmol/L (ref 98–111)
Creatinine, Ser: 0.9 mg/dL (ref 0.61–1.24)
GFR, Estimated: >60 mL/min (ref >60)
Glucose, Bld: 145 mg/dL — ABNORMAL HIGH (ref 70–99)
Potassium: 4.1 mmol/L (ref 3.5–5.1)
Sodium: 140 mmol/L (ref 135–145)
Total Bilirubin: 0.4 mg/dL (ref 0.3–1.2)
Total Protein: 7.2 g/dL (ref 6.5–8.1)

## 2023-04-14 MED ORDER — LEUPROLIDE ACETATE (6 MONTH) 45 MG ~~LOC~~ KIT
45.0000 mg | PACK | Freq: Once | SUBCUTANEOUS | Status: AC
  Administered 2023-04-14: 45 mg via SUBCUTANEOUS
  Filled 2023-04-14: qty 45

## 2023-04-14 MED ORDER — APALUTAMIDE 60 MG PO TABS
60.0000 mg | ORAL_TABLET | Freq: Every day | ORAL | 3 refills | Status: DC
Start: 2023-04-14 — End: 2023-07-14
  Filled 2023-04-14 – 2023-05-30 (×2): qty 30, 30d supply, fill #0
  Filled 2023-06-16: qty 30, 30d supply, fill #1
  Filled 2023-07-13: qty 30, 30d supply, fill #2

## 2023-04-14 NOTE — Assessment & Plan Note (Signed)
COPD/Emphysema, recommend smoke cessation.  Patient declines pulmonology follow-ups.  He does not actively follow-up with PCP Continue albuterol as needed for wheezing/shortness of breath, Advair  twice daily-patient admits not being very compliant. I encourage patient to increase compliance and make a follow-up with appointment with pulmonology. 

## 2023-04-14 NOTE — Progress Notes (Signed)
Patient states that he has some questions for the doctor.

## 2023-04-14 NOTE — Assessment & Plan Note (Signed)
trazodone 50 mg nightly as needed. Marland Kitchen

## 2023-04-14 NOTE — Progress Notes (Signed)
Hematology/Oncology Progress note [phone] [phone]     Chief Complaint Castration resistant  prostate cancer   ASSESSMENT & PLAN:   Cancer Staging  No matching staging information was found for the patient.  Malignant neoplasm of prostate [facility]) Prostate Cancer with biochemical recurrence, asymptomatic, castration resistant nonmetastatic.  Previous PSMA PET scan showed elevated SUV in the prostate/prostate bed, consider future reestablishment of Radonc for evaluation of additional radiation. Continue ADT plan with [facility]  Q6 months -proceed today, next due October 2024 Labs reviewed and discussed with patient. Patient can only tolerate apalutamide  daily.  PSA is stable, latest level is pending.  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HCC) COPD/Emphysema, recommend smoke cessation.  Patient declines pulmonology follow-ups.  He does not actively follow-up with PCP Continue albuterol as needed for wheezing/shortness of breath, Advair  twice daily-patient admits not being very compliant. I encourage patient to increase compliance and make a follow-up with appointment with pulmonology.  Other fatigue Multifactorial, medication side effects, hypoxia secondary to COPD/emphysema, prolonged grieving/depression Recommend empiric B12 and Vitamin D supplementation.  Symptom has improved.  Insomnia  trazodone 50 mg nightly as needed. .  Orders Placed This Encounter  Procedures   CBC with Differential (Cancer Center Only)    Standing Status:   Future    Standing Expiration Date:   04/13/2024   CMP (Cancer Center only)    Standing Status:   Future    Standing Expiration Date:   04/13/2024   PSA    Standing Status:   Future    Standing Expiration Date:   04/13/2024    Follow up  3 months  All questions were answered. The patient knows to call the clinic with any problems, questions or concerns.  Rickard Patience, MD, PhD [facility] Hematology  Oncology 04/14/2023    HISTORY OF PRESENTING ILLNESS:  Sean Wood. is a  87 y.o.  male with PMH listed below who was referred to me for evaluation of prostate cancer.   Cancer history dated back to 2009 when He was diagnosed with intermediate risk, Gleason 3+4, T1c prostate cancer in 2009. PSA at diagnosis was 6.1. IMRT was completed in August 2009. PSA nadir was 0.5. His PSA January 2013 was 0.8 and in January 2014 was 1.3. This was repeated in May 2014 and had increased to 1.8. He elected a repeat prostate biopsy in November 2014 after his PSA in October had increased to 2.2. This showed a focus of Gleason 3+3 adenocarcinoma from the right prostate involving less than 5%. He elected surveillance after discussing options. In May 2016, he was evaluated by Dr.Chrystal and he received salvage seed implantation with I-125. He subsequently follows with Dr.Chrystal for Lupron every 4 months since then. Achieve PSA nadir of 0.06 in 09/2016, and started to trend up and most recently trended up to 9.64.   # Lupron 30 mg on 05/10/2018, 06/14/2018 testosterone level was at 6  # Leupron 22.5mg  11/06/2018.  Delayed as patient reports feeling very fatigued and we discussed about holding Lupron.  39-month and restarted on 11/06/2018.  10/06/2021, PSMA PET scan showed marked radiotracer accumulation with elevated SUV in the prostate/prostate bed tracking towards the bladder base with distortion of the urinary bladder.  Signs of brachytherapy in the prostate as well.  No definitive signs of extraprostatic disease.  A single lymph node in the left the pelvis with only minimally elevated SUV.  Equivocal.  Aortic atherosclerosis.  10/20/2021 Darolutamide  BID 01/01/2022, decreased to Darolutamide   BID 06/10/2022 Darolutamide  BID held 07/27/2022, started on apalutamide 240 mg daily. INTERVAL HISTORY Sean Wood. is a 87 y.o. male who has above history reviewed by me present for follow-up of  management of biochemical recurrence of prostate cancer  Patient was accompanied by his daughter. He is only taking  of Apalutamide.   Chronic fatigue remains same patient takes trazodone 25 mg nightly as needed.     Review of Systems  Constitutional:  Positive for malaise/fatigue. Negative for chills, fever and weight loss.  HENT:  Negative for sore throat.   Eyes:  Negative for redness.  Respiratory:  Positive for shortness of breath. Negative for cough and wheezing.   Cardiovascular:  Negative for chest pain, palpitations and leg swelling.  Gastrointestinal:  Negative for abdominal pain, blood in stool, nausea and vomiting.  Genitourinary:  Negative for dysuria.  Musculoskeletal:  Negative for myalgias.  Skin:  Negative for rash.  Neurological:  Negative for dizziness, tingling and tremors.  Endo/Heme/Allergies:  Does not bruise/bleed easily.  Psychiatric/Behavioral:  Negative for hallucinations. The patient has insomnia.     MEDICAL HISTORY:  Past Medical History:  Diagnosis Date   Arthritis    COPD (chronic obstructive pulmonary disease) (HCC)    Elevated PSA    Hematuria, gross    History of hiatal hernia    HOH (hard of hearing)    Kidney stone    Prostate cancer (HCC)    Shortness of breath dyspnea     SURGICAL HISTORY: Past Surgical History:  Procedure Laterality Date   BACK SURGERY  1973, 1987   CYSTOSCOPY  06/24/2015   Procedure: CYSTOSCOPY;  Surgeon: Vanna Scotland, MD;  Location: [facility] ORS;  Service: Urology;;   HERNIA REPAIR     left and right inguinal hernia repair   RADIOACTIVE SEED IMPLANT N/A 06/24/2015   Procedure: RADIOACTIVE SEED IMPLANT/BRACHYTHERAPY IMPLANT;  Surgeon: Vanna Scotland, MD;  Location: [facility] ORS;  Service: Urology;  Laterality: N/A;    SOCIAL HISTORY: Social History   Socioeconomic History   Marital status: Widowed    Spouse name: Not on file   Number of children: 1   Years of education: Not on file   Highest education level: 8th  grade  Occupational History   Occupation: retired  Tobacco Use   Smoking status: Former    Packs/day: 1.5    Types: Cigarettes    Quit date: 05/24/1981    Years since quitting: 41.9   Smokeless tobacco: Current    Types: Chew  Vaping Use   Vaping Use: Never used  Substance and Sexual Activity   Alcohol use: Not Currently    Alcohol/week: 0.0 standard drinks of alcohol   Drug use: No   Sexual activity: Not on file  Other Topics Concern   Not on file  Social History Narrative   Not on file   Social Determinants of Health   Financial Resource Strain: Low Risk  (08/26/2022)   Overall Financial Resource Strain (CARDIA)    Difficulty of Paying Living Expenses: Not hard at all  Food Insecurity: No Food Insecurity (08/26/2022)   Hunger Vital Sign    Worried About Running Out of Food in the Last Year: Never true    Ran Out of Food in the Last Year: Never true  Transportation Needs: No Transportation Needs (08/26/2022)   PRAPARE - Administrator, Civil Service (Medical): No    Lack of Transportation (Non-Medical): No  Physical Activity: Inactive (09/19/2019)  Exercise Vital Sign    Days of Exercise per Week: 0 days    Minutes of Exercise per Session: 0 min  Stress: No Stress Concern Present (09/19/2019)   [employer] of Occupational Health - Occupational Stress Questionnaire    Feeling of Stress : Not at all  Social Connections: Socially Isolated (08/26/2022)   Social Connection and Isolation Panel [NHANES]    Frequency of Communication with Friends and Family: More than three times a week    Frequency of Social Gatherings with Friends and Family: More than three times a week    Attends Religious Services: Never    Database administrator or Organizations: No    Attends Banker Meetings: Never    Marital Status: Widowed  Intimate Partner Violence: Not At Risk (08/26/2022)   Humiliation, Afraid, Rape, and Kick questionnaire    Fear of Current or Ex-Partner:  No    Emotionally Abused: No    Physically Abused: No    Sexually Abused: No    FAMILY HISTORY: Family History  Problem Relation Age of Onset   Cancer Brother    Heart disease Brother    Diabetes Brother    Emphysema Brother    Prostate cancer Brother    Diabetes Sister    Emphysema Mother    Healthy Sister    Healthy Sister    Healthy Sister    Diabetes Brother    Diabetes Brother    Diabetes Brother    Healthy Brother     ALLERGIES:  is allergic to [employer] [lovastatin], niacin and related, statins, and aleve [naproxen sodium].  MEDICATIONS:  Current Outpatient Medications  Medication Sig Dispense Refill   albuterol (VENTOLIN HFA) 108 (90 Base) MCG/ACT inhaler Inhale 2 puffs into the lungs every 6 (six) hours as needed for wheezing or shortness of breath. 8 g 2   aspirin EC 81 MG tablet Take 81 mg by mouth daily. Swallow whole.     cholecalciferol (VITAMIN D3) 25 MCG (1000 UNIT) tablet Take 1,000 Units by mouth daily.     cyanocobalamin (VITAMIN B12) 1000 MCG tablet Take 1,000 mcg by mouth daily.     fluticasone-salmeterol (ADVAIR DISKUS) 250-50 MCG/ACT AEPB Inhale 1 puff into the lungs in the morning and at bedtime. 60 each 1   moxifloxacin (VIGAMOX) 0.5 % ophthalmic solution Place 1 drop into the right eye 4 (four) times daily.     traZODone (DESYREL) 50 MG tablet Take 1 tablet (50 mg total) by mouth at bedtime as needed for sleep. 30 tablet 2   apalutamide (ERLEADA) 60 MG tablet Take 1 tablet (60 mg total) by mouth daily. 30 tablet 3   calcium-vitamin D (OSCAL WITH D) 500-5 MG-MCG tablet Take 2 tablets by mouth daily. (Patient not taking: Reported on 09/21/2022) 90 tablet 1   No current facility-administered medications for this visit.     PHYSICAL EXAMINATION: ECOG PERFORMANCE STATUS: 1 - Symptomatic but completely ambulatory Vitals:   04/14/23 1450  BP: 139/61  Pulse: 69  Resp: 18  Temp: (!) 96.3 F (35.7 C)  SpO2: 98%   Filed Weights   04/14/23 1450   Weight: 140 lb 14.4 oz (63.9 kg)     Physical Exam Constitutional:      General: He is not in acute distress.    Comments: Patient walks independently.  HENT:     Head: Normocephalic.  Eyes:     General: No scleral icterus. Cardiovascular:     Rate and Rhythm: Normal  rate and regular rhythm.     Heart sounds: Normal heart sounds.  Pulmonary:     Effort: Pulmonary effort is normal. No respiratory distress.     Breath sounds: No wheezing.     Comments: Decreased breath sound bilaterally Abdominal:     General: Bowel sounds are normal. There is no distension.     Palpations: Abdomen is soft.  Musculoskeletal:        General: No deformity. Normal range of motion.     Cervical back: Normal range of motion and neck supple.  Lymphadenopathy:     Cervical: No cervical adenopathy.  Skin:    General: Skin is warm and dry.     Findings: No erythema or rash.  Neurological:     Mental Status: He is alert and oriented to person, place, and time. Mental status is at baseline.     Cranial Nerves: No cranial nerve deficit.  Psychiatric:        Mood and Affect: Mood normal.      LABORATORY DATA:  I have reviewed the data as listed     Latest Ref Rng & Units 04/14/2023    2:41 PM 01/19/2023    1:27 PM 12/08/2022    9:46 AM  CBC  WBC 4.0 - 10.5 K/uL 5.1  4.9  4.6   Hemoglobin 13.0 - 17.0 g/dL 16.1  09.6  04.5   Hematocrit 39.0 - 52.0 % 41.5  43.0  45.3   Platelets 150 - 400 K/uL 219  203  180       Latest Ref Rng & Units 04/14/2023    2:41 PM 01/19/2023    1:27 PM 12/08/2022    9:46 AM  CMP  Glucose 70 - 99 mg/dL 409  811  914   BUN 8 - 23 mg/dL 18  17  25    Creatinine 0.61 - 1.24 mg/dL 7.82  9.56  2.13   Sodium 135 - 145 mmol/L 140  138  142   Potassium 3.5 - 5.1 mmol/L 4.1  4.1  4.0   Chloride 98 - 111 mmol/L 102  99  104   CO2 22 - 32 mmol/L 29  29  30    Calcium 8.9 - 10.3 mg/dL 8.7  8.8  9.4   Total Protein 6.5 - 8.1 g/dL 7.2  7.3  7.8   Total Bilirubin 0.3 - 1.2  mg/dL 0.4  0.3  0.4   Alkaline Phos 38 - 126 U/L 75  87  80   AST 15 - 41 U/L 18  17  16    ALT 0 - 44 U/L 8  7  8       RADIOGRAPHIC STUDIES: I have personally reviewed the radiological images as listed and agreed with the findings in the report. No results found.

## 2023-04-14 NOTE — Assessment & Plan Note (Signed)
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  Symptom has improved. 

## 2023-04-14 NOTE — Assessment & Plan Note (Signed)
Prostate Cancer with biochemical recurrence, asymptomatic, castration resistant nonmetastatic.  Previous PSMA PET scan showed elevated SUV in the prostate/prostate bed, consider future reestablishment of Radonc for evaluation of additional radiation. Continue ADT plan with [facility]  Q6 months -proceed today, next due October 2024 Labs reviewed and discussed with patient. Patient can only tolerate apalutamide  daily.  PSA is stable, latest level is pending.

## 2023-04-15 LAB — PSA: Prostatic Specific Antigen: 0.46 ng/mL (ref 0.00–4.00)

## 2023-05-02 ENCOUNTER — Other Ambulatory Visit: Payer: Self-pay

## 2023-05-06 ENCOUNTER — Ambulatory Visit: Payer: Self-pay

## 2023-05-06 ENCOUNTER — Other Ambulatory Visit: Payer: PPO

## 2023-05-06 DIAGNOSIS — Z515 Encounter for palliative care: Secondary | ICD-10-CM

## 2023-05-06 MED ORDER — BENZONATATE 100 MG PO CAPS: 100.0000 mg | ORAL_CAPSULE | Freq: Two times a day (BID) | ORAL | 0 refills | Status: DC | PRN

## 2023-05-06 NOTE — Addendum Note (Signed)
Addended by: Bing Neighbors on: 05/06/2023 04:46 PM   Modules accepted: Orders

## 2023-05-06 NOTE — Progress Notes (Signed)
TELEPHONE ENCOUNTER  Palliative car concocted with patient via telephone to cancel and reschedule PC RN visit today, patient identified his name and DOB.  Patient endorses feeling well today. No concerns ir issues voiced.   Fatigue:   Ongoing.  Patient continues to not sleep well.   Grief: Patient continues to process through the loss of his wife. Shares that he feels this may be the reason he is not sleeping well. Brief grief support offered and privided. Patient declined ongoing grief support referral at this time    Prostate Ca: Saw oncology. No changes reported.    Shortness of breath:  Ongoing.  Education provided on purpose of inhalers and encouraged taking Advair bid as directed.  If symptoms worsen contact provider.     Palliative care to follow up in 3-4 weeks.    CODE STATUS: DNR ADVANCED DIRECTIVES: No MOST FORM: Yes PPS: 50%

## 2023-05-06 NOTE — Telephone Encounter (Signed)
Prescription has been sent for as needed medication    Would recommend warm teas with honey and plenty of hydration with water  If patient develops fevers, chills, lightheadedness or SOB, recommend ED evaluation if patient is unable to be evaluated within the next week (in the event of persistent symptoms)  Ronnald Ramp, MD  [facility]

## 2023-05-06 NOTE — Telephone Encounter (Signed)
  Chief Complaint: Cough, SOB Symptoms: Cough, chest and sinus congestion, SOB Frequency: 2 weeks Pertinent Negatives: Patient denies Fever Disposition: [] ED /[] Urgent Care (no appt availability in office) / [] Appointment(In office/virtual)/ []  [place] [facility]/ [] Home Care/ [x] Refused Recommended Disposition /[] [place] [facility]/ []  Follow-up with PCP Additional Notes: Pt has had Cough, runny nose, SOB, sinus and chest congestion for 2 weeks. PT would like something called in. Pt refuses UC and will wait for appt on Monday. Pt will get care if needed over the weekend.    Summary: cough and congestion / rx req   The patient has experienced cough and congestion for roughly two weeks  The patient shares that they have taken no over the counter medication  The patients would like to be prescribed something for their symptoms  Please contact further when possible     Reason for Disposition  [1] Known COPD or other severe lung disease (i.e., bronchiectasis, cystic fibrosis, lung surgery) AND [2] worsening symptoms (i.e., increased sputum purulence or amount, increased breathing difficulty  Answer Assessment - Initial Assessment Questions 1. ONSET: "When did the cough begin?"      2 weeks 2. SEVERITY: "How bad is the cough today?"      Mild moderate 3. SPUTUM: "Describe the color of your sputum" (none, dry cough; clear, white, yellow, green)     White 4. HEMOPTYSIS: "Are you coughing up any blood?" If so ask: "How much?" (flecks, streaks, tablespoons, etc.)     no 5. DIFFICULTY BREATHING: "Are you having difficulty breathing?" If Yes, ask: "How bad is it?" (e.g., mild, moderate, severe)    - MILD: No SOB at rest, mild SOB with walking, speaks normally in sentences, can lie down, no retractions, pulse < 100.    - MODERATE: SOB at rest, SOB with minimal exertion and prefers to sit, cannot lie down flat, speaks in phrases, mild retractions, audible wheezing, pulse 100-120.    -  SEVERE: Very SOB at rest, speaks in single words, struggling to breathe, sitting hunched forward, retractions, pulse > 120      Has emphysema - worse than usual 6. FEVER: "Do you have a fever?" If Yes, ask: "What is your temperature, how was it measured, and when did it start?"     no 7. CARDIAC HISTORY: "Do you have any history of heart disease?" (e.g., heart attack, congestive heart failure)      no 8. LUNG HISTORY: "Do you have any history of lung disease?"  (e.g., pulmonary embolus, asthma, emphysema)     Emphysema 10. OTHER SYMPTOMS: "Do you have any other symptoms?" (e.g., runny nose, wheezing, chest pain)       Chest, runny nose  Protocols used: Cough - Acute Productive-A-AH

## 2023-05-09 ENCOUNTER — Telehealth: Payer: Self-pay | Admitting: *Deleted

## 2023-05-09 ENCOUNTER — Ambulatory Visit
Admission: RE | Admit: 2023-05-09 | Discharge: 2023-05-09 | Disposition: A | Discharge status: Home / Self Care | Payer: PPO | Source: Ambulatory Visit | Attending: Physician Assistant | Admitting: Physician Assistant

## 2023-05-09 ENCOUNTER — Encounter: Payer: Self-pay | Admitting: Physician Assistant

## 2023-05-09 ENCOUNTER — Other Ambulatory Visit: Payer: Self-pay

## 2023-05-09 ENCOUNTER — Ambulatory Visit (INDEPENDENT_AMBULATORY_CARE_PROVIDER_SITE_OTHER): Payer: PPO | Admitting: Physician Assistant

## 2023-05-09 VITALS — BP 116/69 | HR 90 | Temp 98.0°F | Resp 15 | Ht 69.0 in | Wt 135.3 lb

## 2023-05-09 DIAGNOSIS — R0602 Shortness of breath: Secondary | ICD-10-CM | POA: Insufficient documentation

## 2023-05-09 DIAGNOSIS — J441 Chronic obstructive pulmonary disease with (acute) exacerbation: Secondary | ICD-10-CM

## 2023-05-09 DIAGNOSIS — R059 Cough, unspecified: Secondary | ICD-10-CM | POA: Diagnosis not present

## 2023-05-09 MED ORDER — PREDNISONE 10 MG PO TABS
ORAL_TABLET | ORAL | 0 refills | Status: AC
Start: 2023-05-09 — End: 2023-05-15

## 2023-05-09 MED ORDER — LEVOFLOXACIN 500 MG PO TABS
500.0000 mg | ORAL_TABLET | Freq: Every day | ORAL | 0 refills | Status: AC
Start: 2023-05-09 — End: 2023-05-16

## 2023-05-09 NOTE — Progress Notes (Signed)
I,Vanessa  Vital,acting as a Neurosurgeon for [employer], [name].,have documented all relevant documentation on the behalf of Alfredia Ferguson, [name],as directed by  Alfredia Ferguson, [name] while in the presence of Alfredia Ferguson, [name].   Established patient visit   Patient: Sean Wood.   DOB: 1936-11-29   87 y.o. Male  MRN: [id number] Visit Date: 05/09/2023  Today's healthcare provider: Alfredia Ferguson, [name]   Cc. SOB, headache, congestion  Subjective    HPI  Pt reports headache, nasal congestion, SOB, cough x 2-3 weeks. Denies fevers.   ---------------------------------------------------------------------------------------------------    Medications: Outpatient Medications Prior to Visit  Medication Sig   albuterol (VENTOLIN HFA) 108 (90 Base) MCG/ACT inhaler Inhale 2 puffs into the lungs every 6 (six) hours as needed for wheezing or shortness of breath.   apalutamide (ERLEADA) 60 MG tablet Take 1 tablet (60 mg total) by mouth daily.   aspirin EC 81 MG tablet Take 81 mg by mouth daily. Swallow whole.   benzonatate (TESSALON) 100 MG capsule Take 1 capsule (100 mg total) by mouth 2 (two) times daily as needed for cough.   calcium-vitamin D (OSCAL WITH D) 500-5 MG-MCG tablet Take 2 tablets by mouth daily.   cholecalciferol (VITAMIN D3) 25 MCG (1000 UNIT) tablet Take 1,000 Units by mouth daily.   cyanocobalamin (VITAMIN B12) 1000 MCG tablet Take 1,000 mcg by mouth daily.   fluticasone-salmeterol (ADVAIR DISKUS) 250-50 MCG/ACT AEPB Inhale 1 puff into the lungs in the morning and at bedtime.   moxifloxacin (VIGAMOX) 0.5 % ophthalmic solution Place 1 drop into the right eye 4 (four) times daily.   traZODone (DESYREL) 50 MG tablet Take 1 tablet (50 mg total) by mouth at bedtime as needed for sleep.   No facility-administered medications prior to visit.    Review of Systems  Constitutional:  Positive for fatigue.  Respiratory:  Positive for cough, chest tightness, shortness of  breath and wheezing.   Neurological:  Positive for light-headedness and headaches.       Objective    BP 116/69 (BP Location: Left Arm, Patient Position: Sitting, Cuff Size: Normal)   Pulse 90   Temp 98 F (36.7 C) (Oral)   Resp 15   Ht 5\' 9"  (1.753 m)   Wt 135 lb 4.8 oz (61.4 kg)   SpO2 97%   BMI 19.98 kg/m   Physical Exam Constitutional:      General: He is awake.     Appearance: He is well-developed.  HENT:     Head: Normocephalic.  Eyes:     Conjunctiva/sclera: Conjunctivae normal.  Cardiovascular:     Rate and Rhythm: Normal rate and regular rhythm.     Heart sounds: Normal heart sounds.  Pulmonary:     Effort: Pulmonary effort is normal. Tachypnea present.     Breath sounds: Examination of the left-upper field reveals decreased breath sounds. Examination of the left-middle field reveals decreased breath sounds. Examination of the left-lower field reveals decreased breath sounds. Decreased breath sounds present.  Skin:    General: Skin is warm.  Neurological:     Mental Status: He is alert and oriented to person, place, and time.  Psychiatric:        Attention and Perception: Attention normal.        Mood and Affect: Mood normal.        Speech: Speech normal.        Behavior: Behavior is cooperative.     No results found for  any visits on 05/09/23.  Assessment & Plan     1. Shortness of breath Ordered stat cxr - DG Chest 2 View  2. COPD exacerbation (HCC) No new consolidations visualized, chronic scarring/ density.  - levofloxacin (LEVAQUIN) 500 MG tablet; Take 1 tablet (500 mg total) by mouth daily for 7 days.  Dispense: 7 tablet; Refill: 0 - predniSONE (DELTASONE) 10 MG tablet; Take 6 tablets (60 mg total) by mouth daily with breakfast for 1 day, THEN 5 tablets (50 mg total) daily with breakfast for 1 day, THEN 4 tablets (40 mg total) daily with breakfast for 1 day, THEN 3 tablets (30 mg total) daily with breakfast for 1 day, THEN 2 tablets (20 mg total)  daily with breakfast for 1 day, THEN 1 tablet (10 mg total) daily with breakfast for 1 day.  Dispense: 21 tablet; Refill: 0   Pt given strict recommendations if symptoms do not improve or worsen to go to ED Increase fluid intake. Cont inhalers, albuterol q 4 hours .  Return if symptoms worsen or fail to improve.      I, Alfredia Ferguson, [name] have reviewed all documentation for this visit. The documentation on  05/09/23   for the exam, diagnosis, procedures, and orders are all accurate and complete.  Alfredia Ferguson, [name] [facility] [address] [place], [place], [address] Office: [phone] Fax: [phone]   [facility]

## 2023-05-09 NOTE — Progress Notes (Signed)
  Care Coordination   Note   05/09/2023 Name: Sean Wood. MRN: [id number] DOB: 14-Jul-1936  Sean Wood. is a 87 y.o. year old male who sees Pardue, Monico Blitz, DO for primary care. I reached out to [facility]. by phone today to offer care coordination services.  Sean Wood was given information about Care Coordination services today including:   The Care Coordination services include support from the care team which includes your Nurse Coordinator, Clinical Social Worker, or Pharmacist.  The Care Coordination team is here to help remove barriers to the health concerns and goals most important to you. Care Coordination services are voluntary, and the patient may decline or stop services at any time by request to their care team member.   Care Coordination Consent Status: Patient agreed to services and verbal consent obtained.   Follow up plan:  Telephone appointment with care coordination team member scheduled for:  05/12/2023   Encounter Outcome:  Pt. Scheduled from referral   Burman Nieves, [facility] Care Coordination Care Guide Direct Dial: [phone]

## 2023-05-09 NOTE — Progress Notes (Signed)
  Care Coordination  Outreach Note  05/09/2023 Name: Sean Wood. MRN: [id number] DOB: 1936/07/01   Care Coordination Outreach Attempts: An unsuccessful telephone outreach was attempted today to offer the patient information about available care coordination services.  Follow Up Plan:  Additional outreach attempts will be made to offer the patient care coordination information and services.   Encounter Outcome:  No Answer  Burman Nieves, CCMA Care Coordination Care Guide Direct Dial: [phone]

## 2023-05-12 ENCOUNTER — Ambulatory Visit: Payer: Self-pay | Admitting: *Deleted

## 2023-05-12 ENCOUNTER — Encounter: Payer: PPO | Admitting: *Deleted

## 2023-05-12 NOTE — Patient Outreach (Signed)
  Care Coordination   Follow Up Visit Note   05/13/2023 Name: Sean Wood. MRN: [id number] DOB: 1936/02/04  Sean Hams. is a 87 y.o. year old male who sees Pardue, Monico Blitz, DO for primary care. I spoke with  Sean Hams. by phone today.  What matters to the patients health and wellness today?  Relief of cough.  Lives alone, but report children are very helpful in his care.    Goals Addressed             This Visit's Progress    Effective management of COPD       Care Coordination Interventions: Provided patient with basic written and verbal COPD education on self care/management/and exacerbation prevention Advised patient to track and manage COPD triggers Advised patient to self assesses COPD action plan zone and make appointment with provider if in the yellow zone for 48 hours without improvement Provided education about and advised patient to utilize infection prevention strategies to reduce risk of respiratory infection         SDOH assessments and interventions completed:  No     Care Coordination Interventions:  Yes, provided   Interventions Today    Flowsheet Row Most Recent Value  Chronic Disease   Chronic disease during today's visit Chronic Obstructive Pulmonary Disease (COPD)  General Interventions   General Interventions Discussed/Reviewed General Interventions Reviewed, Doctor Visits, Communication with, [employer] active, next visit on 6/5]  Doctor Visits Discussed/Reviewed Doctor Visits Reviewed  [Does not have appointment with pulmonary.  Will inquire about need for appointment, possibly will need new referral]  Communication with PCP/Specialists  [Communicated with PCP in regards to follow up plan and pulmonary appointment]  Education Interventions   Education Provided Provided Education  Provided Verbal Education On When to see the doctor, Medication  [Discussed taking antibiotics, steroids, and inhalers for  COPD]        Follow up plan: Follow up call scheduled for 6/17    Encounter Outcome:  Pt. Visit Completed   Kemper Durie, RN, MSN, [facility] Care Management Care Management Coordinator [phone]

## 2023-05-25 ENCOUNTER — Other Ambulatory Visit: Payer: PPO

## 2023-05-25 VITALS — BP 96/62 | HR 76 | Temp 97.3°F | Wt 135.2 lb

## 2023-05-25 DIAGNOSIS — Z515 Encounter for palliative care: Secondary | ICD-10-CM

## 2023-05-25 NOTE — Progress Notes (Signed)
PATIENT NAME: Sean Wood. DOB: [birth year] MRN: [id number]  PRIMARY CARE PROVIDER: Sherlyn Hay, DO  RESPONSIBLE PARTY:  Acct ID - Guarantor Home Phone Work Phone Relationship Acct Type  [id number] Sean Wood* [phone]  Self P/F     [address], [place], [place] [address]    Home visit completed with patient.  Abnormal weight loss:  Down to 135.2 lbs.  5 lb weight loss in 1 month.  Patient endorses eating 2-3 meals daily.  Not taking any supplement drinks at this time. Encouraged supplement drinks daily.  Functional Status:  Remains unchanged.  Independent with all ADL's and is living alone.  No assisted devices needed but has a cane in his car should he need it.  No falls reported.   Hypotension:  Blood pressure 96/62 to left arm and 104/62 to right arm.  Patient is not on antihypertensive medications.  Denies any dizziness.  Reports adequate intake of liquids.  Typically averages 48 oz a day sometimes more.  Blood pressure rechecked before visit ended and reading is 110/62 left arm.  Infection:  Patient advised he was seen by PCP a couple of weeks ago to address respiratory virus.  Antibiotics completed and no further issues reported.   Insomnia:  Reports difficulty falling asleep at times.  Does better when he has been active during the day.  Has trazodone but does not feel this is effective.   Not interested in trying anything to help with sleep at this time.   Medication Management:  Reviewed medications with patient and updated medication profile.    Pain:  No complaints voiced.  Follow up visit:  July 5 @ 9 am.   CODE STATUS: DNR ADVANCED DIRECTIVES: No MOST FORM: Yes PPS: 60%   PHYSICAL EXAM:   VITALS: Today's Vitals   05/25/23 0925  BP: 96/62  Pulse: 76  Temp: (!) 97.3 F (36.3 C)  SpO2: 97%  Weight: 135 lb 3.2 oz (61.3 kg)    LUNGS: decreased breath sounds CARDIAC: Cor RRR}  EXTREMITIES: negative SKIN: Skin color, texture, turgor  normal. No rashes or lesions or mobility and turgor normal  NEURO: negative for dizziness, gait problems, headaches, and memory problems       Truitt Merle, RN

## 2023-05-30 ENCOUNTER — Other Ambulatory Visit: Payer: Self-pay

## 2023-05-31 ENCOUNTER — Ambulatory Visit (INDEPENDENT_AMBULATORY_CARE_PROVIDER_SITE_OTHER): Payer: PPO

## 2023-05-31 VITALS — Ht 69.0 in | Wt 135.0 lb

## 2023-05-31 DIAGNOSIS — Z0000 Encounter for general adult medical examination without abnormal findings: Secondary | ICD-10-CM | POA: Diagnosis not present

## 2023-05-31 NOTE — Patient Instructions (Signed)
Mr. Sean Wood , Thank you for taking time to come for your Medicare Wellness Visit. I appreciate your ongoing commitment to your health goals. Please review the following plan we discussed and let me know if I can assist you in the future.   These are the goals we discussed:  Goals      DIET - WATER INTAKE     Continue drinking 6-8 glasses of water a day.      Effective management of COPD     Care Coordination Interventions: Provided patient with basic written and verbal COPD education on self care/management/and exacerbation prevention Advised patient to track and manage COPD triggers Advised patient to self assesses COPD action plan zone and make appointment with provider if in the yellow zone for 48 hours without improvement Provided education about and advised patient to utilize infection prevention strategies to reduce risk of respiratory infection         This is a list of the screening recommended for you and due dates:  Health Maintenance  Topic Date Due   COVID-19 Vaccine (1) Never done   Zoster (Shingles) Vaccine (1 of 2) Never done   DTaP/Tdap/Td vaccine (2 - Td or Tdap) 10/13/2021   Flu Shot  07/21/2023   Pneumonia Vaccine  Completed   HPV Vaccine  Aged Out    Advanced directives: yes  Conditions/risks identified: low falls risk  Next appointment: Follow up in one year for your annual wellness visit. 06/04/2024 @8 :15am telephone  Preventive Care 65 Years and Older, Male  Preventive care refers to lifestyle choices and visits with your health care provider that can promote health and wellness. What does preventive care include? A yearly physical exam. This is also called an annual well check. Dental exams once or twice a year. Routine eye exams. Ask your health care provider how often you should have your eyes checked. Personal lifestyle choices, including: Daily care of your teeth and gums. Regular physical activity. Eating a healthy diet. Avoiding tobacco  and drug use. Limiting alcohol use. Practicing safe sex. Taking low doses of aspirin every day. Taking vitamin and mineral supplements as recommended by your health care provider. What happens during an annual well check? The services and screenings done by your health care provider during your annual well check will depend on your age, overall health, lifestyle risk factors, and family history of disease. Counseling  Your health care provider may ask you questions about your: Alcohol use. Tobacco use. Drug use. Emotional well-being. Home and relationship well-being. Sexual activity. Eating habits. History of falls. Memory and ability to understand (cognition). Work and work Astronomer. Screening  You may have the following tests or measurements: Height, weight, and BMI. Blood pressure. Lipid and cholesterol levels. These may be checked every 5 years, or more frequently if you are over 40 years old. Skin check. Lung cancer screening. You may have this screening every year starting at age 24 if you have a 30-pack-year history of smoking and currently smoke or have quit within the past 15 years. Fecal occult blood test (FOBT) of the stool. You may have this test every year starting at age 65. Flexible sigmoidoscopy or colonoscopy. You may have a sigmoidoscopy every 5 years or a colonoscopy every 10 years starting at age 80. Prostate cancer screening. Recommendations will vary depending on your family history and other risks. Hepatitis C blood test. Hepatitis B blood test. Sexually transmitted disease (STD) testing. Diabetes screening. This is done by checking your blood sugar (  glucose) after you have not eaten for a while (fasting). You may have this done every 1-3 years. Abdominal aortic aneurysm (AAA) screening. You may need this if you are a current or former smoker. Osteoporosis. You may be screened starting at age 72 if you are at high risk. Talk with your health care provider  about your test results, treatment options, and if necessary, the need for more tests. Vaccines  Your health care provider may recommend certain vaccines, such as: Influenza vaccine. This is recommended every year. Tetanus, diphtheria, and acellular pertussis (Tdap, Td) vaccine. You may need a Td booster every 10 years. Zoster vaccine. You may need this after age 71. Pneumococcal 13-valent conjugate (PCV13) vaccine. One dose is recommended after age 34. Pneumococcal polysaccharide (PPSV23) vaccine. One dose is recommended after age 44. Talk to your health care provider about which screenings and vaccines you need and how often you need them. This information is not intended to replace advice given to you by your health care provider. Make sure you discuss any questions you have with your health care provider. Document Released: 01/02/2016 Document Revised: 08/25/2016 Document Reviewed: 10/07/2015 Elsevier Interactive Patient Education  2017 [employer].  Fall Prevention in the Home Falls can cause injuries. They can happen to people of all ages. There are many things you can do to make your home safe and to help prevent falls. What can I do on the outside of my home? Regularly fix the edges of walkways and driveways and fix any cracks. Remove anything that might make you trip as you walk through a door, such as a raised step or threshold. Trim any bushes or trees on the path to your home. Use bright outdoor lighting. Clear any walking paths of anything that might make someone trip, such as rocks or tools. Regularly check to see if handrails are loose or broken. Make sure that both sides of any steps have handrails. Any raised decks and porches should have guardrails on the edges. Have any leaves, snow, or ice cleared regularly. Use sand or salt on walking paths during winter. Clean up any spills in your garage right away. This includes oil or grease spills. What can I do in the  bathroom? Use night lights. Install grab bars by the toilet and in the tub and shower. Do not use towel bars as grab bars. Use non-skid mats or decals in the tub or shower. If you need to sit down in the shower, use a plastic, non-slip stool. Keep the floor dry. Clean up any water that spills on the floor as soon as it happens. Remove soap buildup in the tub or shower regularly. Attach bath mats securely with double-sided non-slip rug tape. Do not have throw rugs and other things on the floor that can make you trip. What can I do in the bedroom? Use night lights. Make sure that you have a light by your bed that is easy to reach. Do not use any sheets or blankets that are too big for your bed. They should not hang down onto the floor. Have a firm chair that has side arms. You can use this for support while you get dressed. Do not have throw rugs and other things on the floor that can make you trip. What can I do in the kitchen? Clean up any spills right away. Avoid walking on wet floors. Keep items that you use a lot in easy-to-reach places. If you need to reach something above you, use  a strong step stool that has a grab bar. Keep electrical cords out of the way. Do not use floor polish or wax that makes floors slippery. If you must use wax, use non-skid floor wax. Do not have throw rugs and other things on the floor that can make you trip. What can I do with my stairs? Do not leave any items on the stairs. Make sure that there are handrails on both sides of the stairs and use them. Fix handrails that are broken or loose. Make sure that handrails are as long as the stairways. Check any carpeting to make sure that it is firmly attached to the stairs. Fix any carpet that is loose or worn. Avoid having throw rugs at the top or bottom of the stairs. If you do have throw rugs, attach them to the floor with carpet tape. Make sure that you have a light switch at the top of the stairs and the  bottom of the stairs. If you do not have them, ask someone to add them for you. What else can I do to help prevent falls? Wear shoes that: Do not have high heels. Have rubber bottoms. Are comfortable and fit you well. Are closed at the toe. Do not wear sandals. If you use a stepladder: Make sure that it is fully opened. Do not climb a closed stepladder. Make sure that both sides of the stepladder are locked into place. Ask someone to hold it for you, if possible. Clearly mark and make sure that you can see: Any grab bars or handrails. First and last steps. Where the edge of each step is. Use tools that help you move around (mobility aids) if they are needed. These include: Canes. Walkers. Scooters. Crutches. Turn on the lights when you go into a dark area. Replace any light bulbs as soon as they burn out. Set up your furniture so you have a clear path. Avoid moving your furniture around. If any of your floors are uneven, fix them. If there are any pets around you, be aware of where they are. Review your medicines with your doctor. Some medicines can make you feel dizzy. This can increase your chance of falling. Ask your doctor what other things that you can do to help prevent falls. This information is not intended to replace advice given to you by your health care provider. Make sure you discuss any questions you have with your health care provider. Document Released: 10/02/2009 Document Revised: 05/13/2016 Document Reviewed: 01/10/2015 Elsevier Interactive Patient Education  2017 [employer].

## 2023-05-31 NOTE — Progress Notes (Addendum)
I connected with  Sean Wood. on 05/31/23 by a audio enabled telemedicine application and verified that I am speaking with the correct person using two identifiers.  Patient Location: Home  Provider Location: Office/Clinic  I discussed the limitations of evaluation and management by telemedicine. The patient expressed understanding and agreed to proceed.  Subjective:   Sean Wood. is a 87 y.o. male who presents for Medicare Annual/Subsequent preventive examination.  Review of Systems         Objective:    There were no vitals filed for this visit. There is no height or weight on file to calculate BMI.     04/14/2023    2:55 PM 12/08/2022   10:07 AM 11/02/2022    1:13 PM 09/21/2022    1:06 PM 07/26/2022    2:48 PM 06/10/2022   10:23 AM 04/13/2022    9:37 AM  Advanced Directives  Does Patient Have a Medical Advance Directive? Yes Yes Yes Yes Yes Yes Yes  Type of Estate agent of [place];Living will Healthcare Power of [place];Living will Living will;Healthcare Power of Attorney Living will;Healthcare Power of Attorney   Living will;Healthcare Power of Attorney  Does patient want to make changes to medical advance directive?    No - Patient declined Yes (ED - Information included in AVS)    Copy of Healthcare Power of Attorney in Chart?   Yes - validated most recent copy scanned in chart (See row information) Yes - validated most recent copy scanned in chart (See row information)       Current Medications (verified) Outpatient Encounter Medications as of 05/31/2023  Medication Sig   albuterol (VENTOLIN HFA) 108 (90 Base) MCG/ACT inhaler Inhale 2 puffs into the lungs every 6 (six) hours as needed for wheezing or shortness of breath.   apalutamide (ERLEADA) 60 MG tablet Take 1 tablet (60 mg total) by mouth daily.   aspirin EC 81 MG tablet Take 81 mg by mouth daily. Swallow whole. (Patient not taking: Reported on 05/25/2023)   benzonatate  (TESSALON) 100 MG capsule Take 1 capsule (100 mg total) by mouth 2 (two) times daily as needed for cough. (Patient not taking: Reported on 05/25/2023)   calcium-vitamin D (OSCAL WITH D) 500-5 MG-MCG tablet Take 2 tablets by mouth daily. (Patient not taking: Reported on 05/25/2023)   cholecalciferol (VITAMIN D3) 25 MCG (1000 UNIT) tablet Take 1,000 Units by mouth daily.   cyanocobalamin (VITAMIN B12) 1000 MCG tablet Take 1,000 mcg by mouth daily.   fluticasone-salmeterol (ADVAIR DISKUS) 250-50 MCG/ACT AEPB Inhale 1 puff into the lungs in the morning and at bedtime.   moxifloxacin (VIGAMOX) 0.5 % ophthalmic solution Place 1 drop into the right eye 4 (four) times daily. (Patient not taking: Reported on 05/25/2023)   traZODone (DESYREL) 50 MG tablet Take 1 tablet (50 mg total) by mouth at bedtime as needed for sleep. (Patient not taking: Reported on 05/25/2023)   No facility-administered encounter medications on file as of 05/31/2023.    Allergies (verified) Mevacor [lovastatin], Niacin and related, Statins, and Aleve [naproxen sodium]   History: Past Medical History:  Diagnosis Date   Arthritis    COPD (chronic obstructive pulmonary disease) (HCC)    Elevated PSA    Hematuria, gross    History of hiatal hernia    HOH (hard of hearing)    Kidney stone    Prostate cancer (HCC)    Shortness of breath dyspnea    Past Surgical History:  Procedure Laterality Date   BACK SURGERY  1973, 1987   CYSTOSCOPY  06/24/2015   Procedure: CYSTOSCOPY;  Surgeon: Vanna Scotland, MD;  Location: [facility] ORS;  Service: Urology;;   HERNIA REPAIR     left and right inguinal hernia repair   RADIOACTIVE SEED IMPLANT N/A 06/24/2015   Procedure: RADIOACTIVE SEED IMPLANT/BRACHYTHERAPY IMPLANT;  Surgeon: Vanna Scotland, MD;  Location: [facility] OR[facility];  Service: Urology;  Laterality: N/A;   Family History  Problem Relation Age of Onset   Cancer Brother    Heart disease Brother    Diabetes Brother    Emphysema Brother    Prostate  cancer Brother    Diabetes Sister    Emphysema Mother    Healthy Sister    Healthy Sister    Healthy Sister    Diabetes Brother    Diabetes Brother    Diabetes Brother    Healthy Brother    Social History   Socioeconomic History   Marital status: Widowed    Spouse name: Not on file   Number of children: 1   Years of education: Not on file   Highest education level: 8th grade  Occupational History   Occupation: retired  Tobacco Use   Smoking status: Former    Packs/day: 1.5    Types: Cigarettes    Quit date: 05/24/1981    Years since quitting: 42.0   Smokeless tobacco: Current    Types: Chew  Vaping Use   Vaping Use: Never used  Substance and Sexual Activity   Alcohol use: Not Currently    Alcohol/week: 0.0 standard drinks of alcohol   Drug use: No   Sexual activity: Not on file  Other Topics Concern   Not on file  Social History Narrative   Not on file   Social Determinants of Health   Financial Resource Strain: Low Risk  (08/26/2022)   Overall Financial Resource Strain (CARDIA)    Difficulty of Paying Living Expenses: Not hard at all  Food Insecurity: No Food Insecurity (08/26/2022)   Hunger Vital Sign    Worried About Running Out of Food in the Last Year: Never true    Ran Out of Food in the Last Year: Never true  Transportation Needs: No Transportation Needs (08/26/2022)   PRAPARE - Administrator, Civil Service (Medical): No    Lack of Transportation (Non-Medical): No  Physical Activity: Inactive (09/19/2019)   Exercise Vital Sign    Days of Exercise per Week: 0 days    Minutes of Exercise per Session: 0 min  Stress: No Stress Concern Present (09/19/2019)   [employer] of Occupational Health - Occupational Stress Questionnaire    Feeling of Stress : Not at all  Social Connections: Socially Isolated (08/26/2022)   Social Connection and Isolation Panel [NHANES]    Frequency of Communication with Friends and Family: More than three times a week     Frequency of Social Gatherings with Friends and Family: More than three times a week    Attends Religious Services: Never    Database administrator or Organizations: No    Attends Banker Meetings: Never    Marital Status: Widowed    Tobacco Counseling Ready to quit: Not Answered Counseling given: Not Answered   Clinical Intake:                 Diabetic?no         Activities of Daily Living    08/27/2022  1:29 PM  In your present state of health, do you have any difficulty performing the following activities:  Hearing? 1  Vision? 0  Difficulty concentrating or making decisions? 0  Walking or climbing stairs? 1  Dressing or bathing? 0  Doing errands, shopping? 0    Patient Care Team: Pardue, Monico Blitz, DO as PCP - General (Family Medicine) Elinor Parkinson, DPM as Consulting Physician (Podiatry) Carmina Miller, MD as Referring Physician (Radiation Oncology) Domingo Madeira, OD as Consulting Physician (Optometry) Rickard Patience, MD as Consulting Physician (Oncology) Kemper Durie, RN as [facility] Care Management  Indicate any recent Medical Services you may have received from other than [facility] providers in the past year (date may be approximate).     Assessment:   This is a routine wellness examination for [place].  Hearing/Vision screen No results found.  Dietary issues and exercise activities discussed:     Goals Addressed   None    Depression Screen    08/27/2022    1:28 PM 03/10/2021    2:32 PM 09/19/2019    8:49 AM 10/03/2018    8:50 AM 05/05/2018    8:58 AM 04/06/2018    9:21 AM 11/23/2016    8:54 AM  PHQ 2/9 Scores  PHQ - 2 Score 0 0 0 0 0 0 0  PHQ- 9 Score 4 0         Fall Risk    08/27/2022    1:28 PM 07/15/2020    1:50 PM 09/19/2019    8:49 AM 10/03/2018    8:50 AM 05/05/2018    8:58 AM  Fall Risk   Falls in the past year? 0 0 0 No No  Comment  Emmi Telephone Survey: data to providers prior to load     Number falls in  past yr: 0  0    Injury with Fall? 0  0    Risk for fall due to : No Fall Risks      Follow up Falls evaluation completed        FALL RISK PREVENTION PERTAINING TO THE HOME:  Any stairs in or around the home? Yes  If so, are there any without handrails? Yes  Home free of loose throw rugs in walkways, pet beds, electrical cords, etc? Yes  Adequate lighting in your home to reduce risk of falls? Yes   ASSISTIVE DEVICES UTILIZED TO PREVENT FALLS:  Life alert? Yes  Use of a cane, walker or w/c? Yes cane Grab bars in the bathroom? No  Shower chair or bench in shower? No  Elevated toilet seat or a handicapped toilet? No    Cognitive Function:        09/19/2019    8:54 AM 11/23/2016    8:58 AM  6CIT Screen  What Year? 0 points 0 points  What month? 0 points 0 points  What time? 0 points 0 points  Count back from 20 0 points 2 points  Months in reverse 0 points 0 points  Repeat phrase 2 points 4 points  Total Score 2 points 6 points    Immunizations Immunization History  Administered Date(s) Administered   Fluad Quad(high Dose 65+) 09/26/2019, 10/07/2020, 08/27/2022   Influenza Split 09/12/2010   Influenza, High Dose Seasonal PF 11/23/2016, 10/12/2017, 08/29/2018   Influenza-Unspecified 12/29/2001, 10/03/2002, 10/18/2003, 10/13/2005, 02/03/2007, 10/26/2007   Pneumococcal Conjugate-13 11/23/2016   Pneumococcal Polysaccharide-23 09/23/2000, 02/03/2007, 04/11/2012   Tdap 10/14/2011    TDAP status: Up to date  Flu Vaccine status: Up to date  Pneumococcal vaccine status: Up to date  Covid-19 vaccine status: Declined, Education has been provided regarding the importance of this vaccine but patient still declined. Advised may receive this vaccine at local pharmacy or Health Dept.or vaccine clinic. Aware to provide a copy of the vaccination record if obtained from local pharmacy or Health Dept. Verbalized acceptance and understanding.  Qualifies for Shingles Vaccine? Yes    Zostavax completed No   Shingrix Completed?: No.    Education has been provided regarding the importance of this vaccine. Patient has been advised to call insurance company to determine out of pocket expense if they have not yet received this vaccine. Advised may also receive vaccine at local pharmacy or Health Dept. Verbalized acceptance and understanding.  Screening Tests Health Maintenance  Topic Date Due   COVID-19 Vaccine (1) Never done   Zoster Vaccines- Shingrix (1 of 2) Never done   DTaP/Tdap/Td (2 - Td or Tdap) 10/13/2021   INFLUENZA VACCINE  07/21/2023   Pneumonia Vaccine 28+ Years old  Completed   HPV VACCINES  Aged Out    Health Maintenance  Health Maintenance Due  Topic Date Due   COVID-19 Vaccine (1) Never done   Zoster Vaccines- Shingrix (1 of 2) Never done   DTaP/Tdap/Td (2 - Td or Tdap) 10/13/2021    Colorectal cancer screening: No longer required.   Lung Cancer Screening: (Low Dose CT Chest recommended if Age 64-80 years, 30 pack-year currently smoking OR have quit w/in 15years.) does not qualify.   Lung Cancer Screening Referral: no  Additional Screening:  Hepatitis C Screening: does not qualify; Completed yes  Vision Screening: Recommended annual ophthalmology exams for early detection of glaucoma and other disorders of the eye. Is the patient up to date with their annual eye exam?  Yes  Who is the provider or what is the name of the office in which the patient attends annual eye exams? Dr Larence Penning If pt is not established with a provider, would they like to be referred to a provider to establish care? No .   Dental Screening: Recommended annual dental exams for proper oral hygiene  Community Resource Referral / Chronic Care Management: CRR required this visit?  No   CCM required this visit?  No      Plan:     I have personally reviewed and noted the following in the patient's chart:   Medical and social history Use of alcohol, tobacco or illicit  drugs  Current medications and supplements including opioid prescriptions. Patient is not currently taking opioid prescriptions. Functional ability and status Nutritional status Physical activity Advanced directives List of other physicians Hospitalizations, surgeries, and ER visits in previous 12 months Vitals Screenings to include cognitive, depression, and falls Referrals and appointments  In addition, I have reviewed and discussed with patient certain preventive protocols, quality metrics, and best practice recommendations. A written personalized care plan for preventive services as well as general preventive health recommendations were provided to patient.     Sue Lush, LPN   0/27/2536   Nurse Notes: pt states he is doing fine "so far". He relays he is only taking "pill for cancer" and Advair everyday. He continues to be independent and lives alone.    I have reviewed the health advisor's note, was available for consultation, and agree with documentation and plan  Despina Arias [facility] 06/14/2023 4:42 PM

## 2023-06-06 ENCOUNTER — Ambulatory Visit: Payer: Self-pay | Admitting: *Deleted

## 2023-06-06 NOTE — Patient Outreach (Signed)
  Care Coordination   Follow Up Visit Note   06/07/2023 Name: Sean Wood. MRN: [id number] DOB: Nov 02, 1936  Sean Wood. is a 87 y.o. year old male who sees Pardue, Monico Blitz, DO for primary care. I spoke with  Sean Wood. by phone today.  What matters to the patients health and wellness today?  Remain free from cough/congestion     Goals Addressed             This Visit's Progress    Effective management of COPD   On track    Care Coordination Interventions: Provided patient with basic written and verbal COPD education on self care/management/and exacerbation prevention Advised patient to track and manage COPD triggers Advised patient to self assesses COPD action plan zone and make appointment with provider if in the yellow zone for 48 hours without improvement Provided education about and advised patient to utilize infection prevention strategies to reduce risk of respiratory infection         SDOH assessments and interventions completed:  Yes     Care Coordination Interventions:  Yes, provided   Interventions Today    Flowsheet Row Most Recent Value  Chronic Disease   Chronic disease during today's visit Chronic Obstructive Pulmonary Disease (COPD)  [recently had couth, was given antibiotics and steroids, but now better and medication course is done]  General Interventions   General Interventions Discussed/Reviewed General Interventions Reviewed, Doctor Visits, Communication with  Doctor Visits Discussed/Reviewed Doctor Visits Reviewed, Annual Wellness Visits, PCP  [AWV done on 6/11, will have palliative care home visit on 7/5]  PCP/Specialist Visits Compliance with follow-up visit  Communication with PCP/Specialists  [collaborate with PCP office to schedule follow up appointment]  Education Interventions   Education Provided Provided Education  Provided Verbal Education On When to see the doctor  [Encouraged to see provider if cough returns]        Follow up plan: Follow up call scheduled for 7/19    Encounter Outcome:  Pt. Visit Completed   Kemper Durie, RN, MSN, [facility] Care Management Care Management Coordinator [phone]

## 2023-06-08 ENCOUNTER — Telehealth: Payer: Self-pay

## 2023-06-08 NOTE — Telephone Encounter (Signed)
-----   Message from [place], [place] sent at 06/08/2023  9:34 AM EDT ----- Regarding: PCP appointment Good morning, Mr. Homrich is needing an appointment with Dr. Payton Mccallum within the next 2 months.  Would someone be able to call him to schedule?  He was seen by Lillia Abed on 5/20 for shortness of breath, but she said he would need to see his PCP within the next 2 months. Thanks, Kemper Durie, RN, MSN, [facility] Care Management Care Management Coordinator [phone]

## 2023-06-08 NOTE — Telephone Encounter (Signed)
Spoke with patient.  Appt made for 07/19/2023 at 10:20 a.m.

## 2023-06-16 ENCOUNTER — Other Ambulatory Visit (HOSPITAL_COMMUNITY): Payer: Self-pay

## 2023-06-24 ENCOUNTER — Other Ambulatory Visit: Payer: PPO

## 2023-06-24 ENCOUNTER — Other Ambulatory Visit: Payer: Self-pay

## 2023-07-07 ENCOUNTER — Other Ambulatory Visit: Payer: Self-pay

## 2023-07-08 ENCOUNTER — Ambulatory Visit: Payer: Self-pay | Admitting: *Deleted

## 2023-07-08 NOTE — Patient Outreach (Signed)
  Care Coordination   07/08/2023 Name: Sean Wood. MRN: [id number] DOB: 1936-02-08   Care Coordination Outreach Attempts:  An unsuccessful telephone outreach was attempted for a scheduled appointment today.  Follow Up Plan:  Additional outreach attempts will be made to offer the patient care coordination information and services.   Encounter Outcome:  No Answer   Care Coordination Interventions:  No, not indicated    Kemper Durie, RN, MSN, [facility] Care Management Care Management Coordinator [phone]

## 2023-07-13 ENCOUNTER — Other Ambulatory Visit (HOSPITAL_COMMUNITY): Payer: Self-pay

## 2023-07-14 ENCOUNTER — Other Ambulatory Visit: Payer: Self-pay

## 2023-07-14 ENCOUNTER — Other Ambulatory Visit (HOSPITAL_COMMUNITY): Payer: Self-pay

## 2023-07-14 ENCOUNTER — Inpatient Hospital Stay (HOSPITAL_BASED_OUTPATIENT_CLINIC_OR_DEPARTMENT_OTHER): Payer: PPO | Admitting: Oncology

## 2023-07-14 ENCOUNTER — Inpatient Hospital Stay: Payer: PPO | Attending: Oncology

## 2023-07-14 ENCOUNTER — Encounter: Payer: Self-pay | Admitting: Oncology

## 2023-07-14 VITALS — BP 136/67 | HR 66 | Temp 97.4°F | Resp 18 | Wt 139.4 lb

## 2023-07-14 DIAGNOSIS — J449 Chronic obstructive pulmonary disease, unspecified: Secondary | ICD-10-CM | POA: Diagnosis not present

## 2023-07-14 DIAGNOSIS — J438 Other emphysema: Secondary | ICD-10-CM | POA: Diagnosis not present

## 2023-07-14 DIAGNOSIS — Z87891 Personal history of nicotine dependence: Secondary | ICD-10-CM | POA: Insufficient documentation

## 2023-07-14 DIAGNOSIS — R5383 Other fatigue: Secondary | ICD-10-CM

## 2023-07-14 DIAGNOSIS — F32A Depression, unspecified: Secondary | ICD-10-CM | POA: Diagnosis not present

## 2023-07-14 DIAGNOSIS — Z8042 Family history of malignant neoplasm of prostate: Secondary | ICD-10-CM | POA: Insufficient documentation

## 2023-07-14 DIAGNOSIS — Z79899 Other long term (current) drug therapy: Secondary | ICD-10-CM | POA: Insufficient documentation

## 2023-07-14 DIAGNOSIS — C61 Malignant neoplasm of prostate: Secondary | ICD-10-CM | POA: Insufficient documentation

## 2023-07-14 DIAGNOSIS — J439 Emphysema, unspecified: Secondary | ICD-10-CM | POA: Insufficient documentation

## 2023-07-14 DIAGNOSIS — I700 Atherosclerosis of aorta: Secondary | ICD-10-CM | POA: Diagnosis not present

## 2023-07-14 LAB — CBC WITH DIFFERENTIAL (CANCER CENTER ONLY)
Abs Immature Granulocytes: 0.01 10*3/uL (ref 0.00–0.07)
Basophils Absolute: 0 10*3/uL (ref 0.0–0.1)
Basophils Relative: 0 %
Eosinophils Absolute: 0 10*3/uL (ref 0.0–0.5)
Eosinophils Relative: 0 %
HCT: 40.9 % (ref 39.0–52.0)
Hemoglobin: 13 g/dL (ref 13.0–17.0)
Immature Granulocytes: 0 %
Lymphocytes Relative: 46 %
Lymphs Abs: 2.2 10*3/uL (ref 0.7–4.0)
MCH: 28.4 pg (ref 26.0–34.0)
MCHC: 31.8 g/dL (ref 30.0–36.0)
MCV: 89.3 fL (ref 80.0–100.0)
Monocytes Absolute: 0.7 10*3/uL (ref 0.1–1.0)
Monocytes Relative: 15 %
Neutro Abs: 1.9 10*3/uL (ref 1.7–7.7)
Neutrophils Relative %: 39 %
Platelet Count: 182 10*3/uL (ref 150–400)
RBC: 4.58 MIL/uL (ref 4.22–5.81)
RDW: 13.9 % (ref 11.5–15.5)
WBC Count: 4.8 10*3/uL (ref 4.0–10.5)
nRBC: 0 % (ref 0.0–0.2)

## 2023-07-14 LAB — PSA: Prostatic Specific Antigen: 0.45 ng/mL (ref 0.00–4.00)

## 2023-07-14 LAB — CMP (CANCER CENTER ONLY)
ALT: 8 U/L (ref 0–44)
AST: 15 U/L (ref 15–41)
Albumin: 4 g/dL (ref 3.5–5.0)
Alkaline Phosphatase: 61 U/L (ref 38–126)
Anion gap: 7 (ref 5–15)
BUN: 16 mg/dL (ref 8–23)
CO2: 32 mmol/L (ref 22–32)
Calcium: 9 mg/dL (ref 8.9–10.3)
Chloride: 101 mmol/L (ref 98–111)
Creatinine: 0.8 mg/dL (ref 0.61–1.24)
GFR, Estimated: >60 mL/min (ref >60)
Glucose, Bld: 132 mg/dL — ABNORMAL HIGH (ref 70–99)
Potassium: 4.4 mmol/L (ref 3.5–5.1)
Sodium: 140 mmol/L (ref 135–145)
Total Bilirubin: 0.2 mg/dL — ABNORMAL LOW (ref 0.3–1.2)
Total Protein: 7 g/dL (ref 6.5–8.1)

## 2023-07-14 MED ORDER — ALBUTEROL SULFATE HFA 108 (90 BASE) MCG/ACT IN AERS: 2.0000 | INHALATION_SPRAY | Freq: Four times a day (QID) | RESPIRATORY_TRACT | 2 refills | Status: DC | PRN

## 2023-07-14 MED ORDER — FLUTICASONE-SALMETEROL 250-50 MCG/ACT IN AEPB: 1.0000 | INHALATION_SPRAY | Freq: Two times a day (BID) | RESPIRATORY_TRACT | 1 refills | Status: DC

## 2023-07-14 MED ORDER — APALUTAMIDE 60 MG PO TABS
60.0000 mg | ORAL_TABLET | Freq: Every day | ORAL | 3 refills | Status: DC
Start: 2023-07-14 — End: 2023-10-20
  Filled 2023-07-14: qty 30, 30d supply, fill #0
  Filled 2023-08-16: qty 30, 30d supply, fill #1
  Filled 2023-09-06: qty 30, 30d supply, fill #2

## 2023-07-14 NOTE — Assessment & Plan Note (Addendum)
COPD/Emphysema, recommend smoke cessation.  Patient declines pulmonology follow-ups.  He does not actively follow-up with PCP Continue albuterol as needed for wheezing/shortness of breath, Advair  twice daily-patient admits not being very compliant. I encourage patient to increase compliance and make a follow-up with appointment with pulmonology.

## 2023-07-14 NOTE — Assessment & Plan Note (Addendum)
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  Symptom has slightly improved.

## 2023-07-14 NOTE — Progress Notes (Signed)
Hematology/Oncology Progress note [phone] [phone]     Chief Complaint Castration resistant  prostate cancer   ASSESSMENT & PLAN:   Cancer Staging  No matching staging information was found for the patient.   Malignant neoplasm of prostate [facility]) Prostate Cancer with biochemical recurrence, asymptomatic, castration resistant nonmetastatic.  Previous PSMA PET scan showed elevated SUV in the prostate/prostate bed, consider future reestablishment of Radonc for evaluation of additional radiation. Continue ADT plan with Eligard 45mg  Q6 months -proceed today, next due October 2024 Labs reviewed and discussed with patient. Patient can only tolerate apalutamide 60mg  daily.  PSA is stable, latest level is pending.  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HCC) COPD/Emphysema, recommend smoke cessation.  Patient declines pulmonology follow-ups.  He does not actively follow-up with PCP Continue albuterol as needed for wheezing/shortness of breath, Advair  twice daily-patient admits not being very compliant. I encourage patient to increase compliance and make a follow-up with appointment with pulmonology.  Other fatigue Multifactorial, medication side effects, hypoxia secondary to COPD/emphysema, prolonged grieving/depression Recommend empiric B12 and Vitamin D supplementation.  Symptom has slightly improved.  Orders Placed This Encounter  Procedures   CBC with Differential (Cancer Center Only)    Standing Status:   Future    Standing Expiration Date:   07/13/2024   CMP (Cancer Center only)    Standing Status:   Future    Standing Expiration Date:   07/13/2024   PSA    Standing Status:   Future    Standing Expiration Date:   07/13/2024    Follow up  3 months  All questions were answered. The patient knows to call the clinic with any problems, questions or concerns.  Rickard Patience, MD, PhD [facility] Hematology Oncology 07/14/2023    HISTORY OF PRESENTING  ILLNESS:  Sean Wood. is a  87 y.o.  male with PMH listed below who was referred to me for evaluation of prostate cancer.   Cancer history dated back to 2009 when He was diagnosed with intermediate risk, Gleason 3+4, T1c prostate cancer in 2009. PSA at diagnosis was 6.1. IMRT was completed in August 2009. PSA nadir was 0.5. His PSA January 2013 was 0.8 and in January 2014 was 1.3. This was repeated in May 2014 and had increased to 1.8. He elected a repeat prostate biopsy in November 2014 after his PSA in October had increased to 2.2. This showed a focus of Gleason 3+3 adenocarcinoma from the right prostate involving less than 5%. He elected surveillance after discussing options. In May 2016, he was evaluated by Dr.Chrystal and he received salvage seed implantation with I-125. He subsequently follows with Dr.Chrystal for Lupron every 4 months since then. Achieve PSA nadir of 0.06 in 09/2016, and started to trend up and most recently trended up to 9.64.   # Lupron 30 mg on 05/10/2018, 06/14/2018 testosterone level was at 6  # Leupron 22.5mg  11/06/2018.  Delayed as patient reports feeling very fatigued and we discussed about holding Lupron.  80-month and restarted on 11/06/2018.  10/06/2021, PSMA PET scan showed marked radiotracer accumulation with elevated SUV in the prostate/prostate bed tracking towards the bladder base with distortion of the urinary bladder.  Signs of brachytherapy in the prostate as well.  No definitive signs of extraprostatic disease.  A single lymph node in the left the pelvis with only minimally elevated SUV.  Equivocal.  Aortic atherosclerosis.  10/20/2021 Darolutamide 600mg  BID 01/01/2022, decreased to Darolutamide 300mg  BID 06/10/2022 Darolutamide 300mg  BID held 07/27/2022,  started on apalutamide 240 mg daily. INTERVAL HISTORY Sean Wood. is a 87 y.o. male who has above history reviewed by me present for follow-up of management of biochemical recurrence of prostate  cancer  Patient was here by himeself. He is only taking 60mg  of Apalutamide. Tolerates well. Chronic SOB  Chronic fatigue, slightly better      Review of Systems  Constitutional:  Positive for malaise/fatigue. Negative for chills, fever and weight loss.  HENT:  Negative for sore throat.   Eyes:  Negative for redness.  Respiratory:  Positive for shortness of breath. Negative for cough and wheezing.   Cardiovascular:  Negative for chest pain, palpitations and leg swelling.  Gastrointestinal:  Negative for abdominal pain, blood in stool, nausea and vomiting.  Genitourinary:  Negative for dysuria.  Musculoskeletal:  Negative for myalgias.  Skin:  Negative for rash.  Neurological:  Negative for dizziness, tingling and tremors.  Endo/Heme/Allergies:  Does not bruise/bleed easily.  Psychiatric/Behavioral:  Negative for hallucinations.     MEDICAL HISTORY:  Past Medical History:  Diagnosis Date   Arthritis    COPD (chronic obstructive pulmonary disease) (HCC)    Elevated PSA    Hematuria, gross    History of hiatal hernia    HOH (hard of hearing)    Kidney stone    Prostate cancer (HCC)    Shortness of breath dyspnea     SURGICAL HISTORY: Past Surgical History:  Procedure Laterality Date   BACK SURGERY  1973, 1987   CYSTOSCOPY  06/24/2015   Procedure: CYSTOSCOPY;  Surgeon: Vanna Scotland, MD;  Location: [facility] ORS;  Service: Urology;;   HERNIA REPAIR     left and right inguinal hernia repair   RADIOACTIVE SEED IMPLANT N/A 06/24/2015   Procedure: RADIOACTIVE SEED IMPLANT/BRACHYTHERAPY IMPLANT;  Surgeon: Vanna Scotland, MD;  Location: [facility] ORS;  Service: Urology;  Laterality: N/A;    SOCIAL HISTORY: Social History   Socioeconomic History   Marital status: Widowed    Spouse name: Not on file   Number of children: 1   Years of education: Not on file   Highest education level: 8th grade  Occupational History   Occupation: retired  Tobacco Use   Smoking status: Former     Current packs/day: 0.00    Types: Cigarettes    Quit date: 05/24/1981    Years since quitting: 42.1   Smokeless tobacco: Current    Types: Chew  Vaping Use   Vaping status: Never Used  Substance and Sexual Activity   Alcohol use: Not Currently    Alcohol/week: 0.0 standard drinks of alcohol   Drug use: No   Sexual activity: Not on file  Other Topics Concern   Not on file  Social History Narrative   Not on file   Social Determinants of Health   Financial Resource Strain: Low Risk  (05/31/2023)   Overall Financial Resource Strain (CARDIA)    Difficulty of Paying Living Expenses: Not hard at all  Food Insecurity: No Food Insecurity (05/31/2023)   Hunger Vital Sign    Worried About Running Out of Food in the Last Year: Never true    Ran Out of Food in the Last Year: Never true  Transportation Needs: No Transportation Needs (05/31/2023)   PRAPARE - Administrator, Civil Service (Medical): No    Lack of Transportation (Non-Medical): No  Physical Activity: Inactive (05/31/2023)   Exercise Vital Sign    Days of Exercise per Week: 0 days  Minutes of Exercise per Session: 0 min  Stress: No Stress Concern Present (05/31/2023)   [employer] of Occupational Health - Occupational Stress Questionnaire    Feeling of Stress : Not at all  Social Connections: Socially Isolated (05/31/2023)   Social Connection and Isolation Panel [NHANES]    Frequency of Communication with Friends and Family: Twice a week    Frequency of Social Gatherings with Friends and Family: Once a week    Attends Religious Services: Never    Database administrator or Organizations: No    Attends Banker Meetings: Never    Marital Status: Widowed  Intimate Partner Violence: Not At Risk (05/31/2023)   Humiliation, Afraid, Rape, and Kick questionnaire    Fear of Current or Ex-Partner: No    Emotionally Abused: No    Physically Abused: No    Sexually Abused: No    FAMILY HISTORY: Family  History  Problem Relation Age of Onset   Cancer Brother    Heart disease Brother    Diabetes Brother    Emphysema Brother    Prostate cancer Brother    Diabetes Sister    Emphysema Mother    Healthy Sister    Healthy Sister    Healthy Sister    Diabetes Brother    Diabetes Brother    Diabetes Brother    Healthy Brother     ALLERGIES:  is allergic to [employer] [lovastatin], niacin and related, statins, and aleve [naproxen sodium].  MEDICATIONS:  Current Outpatient Medications  Medication Sig Dispense Refill   calcium-vitamin D (OSCAL WITH D) 500-5 MG-MCG tablet Take 2 tablets by mouth daily. 90 tablet 1   cholecalciferol (VITAMIN D3) 25 MCG (1000 UNIT) tablet Take 1,000 Units by mouth daily.     cyanocobalamin (VITAMIN B12) 1000 MCG tablet Take 1,000 mcg by mouth daily.     albuterol (VENTOLIN HFA) 108 (90 Base) MCG/ACT inhaler Inhale 2 puffs into the lungs every 6 (six) hours as needed for wheezing or shortness of breath. 8 g 2   apalutamide (ERLEADA) 60 MG tablet Take 1 tablet (60 mg total) by mouth daily. 30 tablet 3   benzonatate (TESSALON) 100 MG capsule Take 1 capsule (100 mg total) by mouth 2 (two) times daily as needed for cough. (Patient not taking: Reported on 05/25/2023) 20 capsule 0   fluticasone-salmeterol (ADVAIR DISKUS) 250-50 MCG/ACT AEPB Inhale 1 puff into the lungs in the morning and at bedtime. 60 each 1   traZODone (DESYREL) 50 MG tablet Take 1 tablet (50 mg total) by mouth at bedtime as needed for sleep. (Patient not taking: Reported on 05/25/2023) 30 tablet 2   No current facility-administered medications for this visit.     PHYSICAL EXAMINATION: ECOG PERFORMANCE STATUS: 1 - Symptomatic but completely ambulatory Vitals:   07/14/23 1438  BP: 136/67  Pulse: 66  Resp: 18  Temp: (!) 97.4 F (36.3 C)   Filed Weights   07/14/23 1438  Weight: 139 lb 6.4 oz (63.2 kg)     Physical Exam Constitutional:      General: He is not in acute distress.    Comments:  Patient walks independently.  HENT:     Head: Normocephalic.  Eyes:     General: No scleral icterus. Cardiovascular:     Rate and Rhythm: Normal rate and regular rhythm.     Heart sounds: Normal heart sounds.  Pulmonary:     Effort: Pulmonary effort is normal. No respiratory distress.  Breath sounds: No wheezing.     Comments: Decreased breath sound bilaterally Abdominal:     General: Bowel sounds are normal. There is no distension.     Palpations: Abdomen is soft.  Musculoskeletal:        General: No deformity. Normal range of motion.     Cervical back: Normal range of motion and neck supple.  Lymphadenopathy:     Cervical: No cervical adenopathy.  Skin:    General: Skin is warm and dry.     Findings: No erythema or rash.  Neurological:     Mental Status: He is alert and oriented to person, place, and time. Mental status is at baseline.     Cranial Nerves: No cranial nerve deficit.  Psychiatric:        Mood and Affect: Mood normal.      LABORATORY DATA:  I have reviewed the data as listed     Latest Ref Rng & Units 07/14/2023    2:22 PM 04/14/2023    2:41 PM 01/19/2023    1:27 PM  CBC  WBC 4.0 - 10.5 K/uL 4.8  5.1  4.9   Hemoglobin 13.0 - 17.0 g/dL 16.1  09.6  04.5   Hematocrit 39.0 - 52.0 % 40.9  41.5  43.0   Platelets 150 - 400 K/uL 182  219  203       Latest Ref Rng & Units 04/14/2023    2:41 PM 01/19/2023    1:27 PM 12/08/2022    9:46 AM  CMP  Glucose 70 - 99 mg/dL 409  811  914   BUN 8 - 23 mg/dL 18  17  25    Creatinine 0.61 - 1.24 mg/dL 7.82  9.56  2.13   Sodium 135 - 145 mmol/L 140  138  142   Potassium 3.5 - 5.1 mmol/L 4.1  4.1  4.0   Chloride 98 - 111 mmol/L 102  99  104   CO2 22 - 32 mmol/L 29  29  30    Calcium 8.9 - 10.3 mg/dL 8.7  8.8  9.4   Total Protein 6.5 - 8.1 g/dL 7.2  7.3  7.8   Total Bilirubin 0.3 - 1.2 mg/dL 0.4  0.3  0.4   Alkaline Phos 38 - 126 U/L 75  87  80   AST 15 - 41 U/L 18  17  16    ALT 0 - 44 U/L 8  7  8        RADIOGRAPHIC STUDIES: I have personally reviewed the radiological images as listed and agreed with the findings in the report. DG Chest 2 View  Result Date: 05/09/2023 CLINICAL DATA:  Shortness of breath, cough EXAM: CHEST - 2 VIEW COMPARISON:  PET-CT dated 10/06/2021. FINDINGS: Subtle increased density overlying the left mid lung/lingula is chronic when correlating with remote prior radiographs, without parenchymal lesion on prior PET-CT, indicating that this does not reflect a true lesion. No focal consolidation. Mild biapical pleural-parenchymal scarring. No pleural effusion or pneumothorax. Heart is normal in size.  Thoracic aortic atherosclerosis. Degenerative changes of the visualized thoracolumbar spine. Moderate compression fracture deformity of an upper/midthoracic vertebral body (likely T5), age indeterminate but new from 2022. IMPRESSION: Moderate compression fracture deformity of an upper/midthoracic vertebral body (likely T5), age indeterminate but new from 2022. Otherwise, no acute cardiopulmonary abnormality. Electronically Signed   By: Charline Bills M.D.   On: 05/09/2023 09:28

## 2023-07-14 NOTE — Assessment & Plan Note (Addendum)
Prostate Cancer with biochemical recurrence, asymptomatic, castration resistant nonmetastatic.  Previous PSMA PET scan showed elevated SUV in the prostate/prostate bed, consider future reestablishment of Radonc for evaluation of additional radiation. Continue ADT plan with Eligard  Q6 months -proceed today, next due October 2024 Labs reviewed and discussed with patient. Patient can only tolerate apalutamide  daily.  PSA is stable, latest level is pending.

## 2023-07-15 ENCOUNTER — Other Ambulatory Visit (HOSPITAL_COMMUNITY): Payer: Self-pay

## 2023-07-19 ENCOUNTER — Encounter: Payer: Self-pay | Admitting: Family Medicine

## 2023-07-19 ENCOUNTER — Ambulatory Visit (INDEPENDENT_AMBULATORY_CARE_PROVIDER_SITE_OTHER): Payer: PPO | Admitting: Family Medicine

## 2023-07-19 ENCOUNTER — Other Ambulatory Visit: Payer: Self-pay

## 2023-07-19 VITALS — BP 135/70 | HR 71 | Temp 98.2°F | Ht 69.0 in | Wt 139.0 lb

## 2023-07-19 DIAGNOSIS — Z0000 Encounter for general adult medical examination without abnormal findings: Secondary | ICD-10-CM | POA: Diagnosis not present

## 2023-07-19 DIAGNOSIS — L409 Psoriasis, unspecified: Secondary | ICD-10-CM | POA: Diagnosis not present

## 2023-07-19 DIAGNOSIS — C61 Malignant neoplasm of prostate: Secondary | ICD-10-CM | POA: Diagnosis not present

## 2023-07-19 DIAGNOSIS — E7800 Pure hypercholesterolemia, unspecified: Secondary | ICD-10-CM | POA: Diagnosis not present

## 2023-07-19 DIAGNOSIS — J438 Other emphysema: Secondary | ICD-10-CM

## 2023-07-19 MED ORDER — CLOBETASOL PROPIONATE 0.05 % EX SOLN: 1.0000 | Freq: Two times a day (BID) | CUTANEOUS | 0 refills | Status: DC

## 2023-07-19 NOTE — Assessment & Plan Note (Signed)
Undergoing active management by Dr. Cathie Hoops.

## 2023-07-19 NOTE — Assessment & Plan Note (Signed)
Prescribed clobetasol solution as noted below.  Advised patient to focus on twice daily until lesions improved, at which point he may decrease to once daily.

## 2023-07-19 NOTE — Assessment & Plan Note (Signed)
Patient is currently endorses that symptoms are well-controlled on the Advair.  I did offer the patient referral to pulmonology for specialist evaluation and recommendations, which he declined.

## 2023-07-19 NOTE — Assessment & Plan Note (Signed)
Physical exam with abnormalities as noted.  Patient is not due for blood work at this time, as he had it done recently by his oncologist.

## 2023-07-19 NOTE — Assessment & Plan Note (Addendum)
Patient does have a high total and LDL cholesterol, as of his previous blood work in 09/11/2018.  However, given his advanced age and current cancer treatment with palliative therapy following, it would not be appropriate to follow this in an attempt to treat it.  Did not counsel patient regarding diet since he is generally at the end of his life and diet adjustment would cause undue stress with little likely benefit.

## 2023-07-19 NOTE — Patient Instructions (Addendum)
You are overdue for your Tdap (tetenus, diphtheria and pertussis) vaccine, as it's been over ten years since you've had one, based on our record. You are also due for the Shingrix vaccine.  - check with your oncologist about whether or not you can have vaccines currently.  If Dr. Cathie Hoops says it's ok, you can get them at the pharmacy recommended by your insurance (or we can give them in clinic if your insurance says they'll cover it, but you have to call to confirm, as Medicare usually doesn't).

## 2023-07-19 NOTE — Progress Notes (Signed)
Established patient visit   Patient: Sean Wood.   DOB: [birth year]   87 y.o. Male  MRN: [id number] Visit Date: 07/19/2023  Today's healthcare provider: Sherlyn Hay, DO   Chief Complaint  Patient presents with   Annual Exam   Subjective    HPI  Patient is here for his annual exam.   Documented by CMA, Toniann Ket DeSanto: "He was last seen in May 2024 for shortness of breath and was treated with Levaquin and Prednisone and advised to continue use of inhaler.  Patient does not remember his last visit or who he saw.  He reports his breathing is ok, not any worse. He feels he is at his baseline. He states he is taking 1 puff Advair both in the morning and at night  - he feels it helps."  Hearing loss:  - Had hearing tests at the [place].  (Left worse than right)  - [facility] physician recommended MRI but pt thinks he was told there was nothing to be done for it, so he didn't have the MRI done.  - Not interested in referral audiological evaluation for hearing aids today.  Wife passed April 2023 (married 42 years)  - she had son and daughter (live in [place])  - He has a son ([place]  - He states his step son and stepdaughter more often than his biological son.  He notes that they are coming to see him this Sunday.    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   Insomnia 11/02/2022   Cyst of subcutaneous tissue 08/29/2022   Hypocalcemia 06/10/2022   Other fatigue 12/05/2021   Actinic keratosis 04/10/2021   Herpes zoster with other nervous system complications 04/10/2021   Low back pain 04/10/2021   Other ill-defined and unknown causes of morbidity and mortality 04/10/2021   Encounter for monitoring androgen deprivation therapy 06/07/2019   Goals of care, counseling/discussion 05/18/2018   Acute exacerbation of chronic obstructive airways disease (HCC) 08/08/2015   Chronic  obstructive pulmonary emphysema (HCC) 08/08/2015   Hypercholesteremia 08/08/2015   Psoriasis 08/08/2015   Abnormal prostate specific antigen 06/01/2013   CA of prostate (HCC) 06/01/2013   Elevated prostate specific antigen (PSA) 06/01/2013   Malignant neoplasm of prostate (HCC) 06/01/2013   History of colon polyps 04/02/2010   H/O malignant neoplasm of prostate 04/02/2010   Allergies  Allergen Reactions   Mevacor [Lovastatin] Other (See Comments)    "feel washed out"   Niacin And Related Other (See Comments)    "hot feeling"   Statins Other (See Comments)    "feel washed out"   Aleve [Naproxen Sodium] Rash      Medications: Outpatient Medications Prior to Visit  Medication Sig   albuterol (VENTOLIN HFA) 108 (90 Base) MCG/ACT inhaler Inhale 2 puffs into the lungs every 6 (six) hours as needed for wheezing or shortness of breath.   apalutamide (ERLEADA) 60 MG tablet Take 1 tablet (60 mg total) by mouth daily.   calcium-vitamin D (OSCAL WITH D) 500-5 MG-MCG tablet Take 2 tablets by mouth daily.   cholecalciferol (VITAMIN D3) 25 MCG (1000 UNIT) tablet Take 1,000 Units by mouth daily.   cyanocobalamin (VITAMIN B12) 1000 MCG tablet Take 1,000 mcg by mouth daily.   fluticasone-salmeterol (ADVAIR DISKUS) 250-50 MCG/ACT AEPB Inhale 1 puff into the lungs in the morning and at bedtime.   [DISCONTINUED] benzonatate (  TESSALON) 100 MG capsule Take 1 capsule (100 mg total) by mouth 2 (two) times daily as needed for cough. (Patient not taking: Reported on 05/25/2023)   [DISCONTINUED] traZODone (DESYREL) 50 MG tablet Take 1 tablet (50 mg total) by mouth at bedtime as needed for sleep. (Patient not taking: Reported on 05/25/2023)   No facility-administered medications prior to visit.    Review of Systems  Constitutional:  Negative for appetite change, chills, fatigue and fever.  HENT:  Positive for hearing loss (L>R). Negative for congestion, ear pain, nosebleeds, sinus pressure, sinus pain and  trouble swallowing.   Eyes:  Negative for pain and visual disturbance.  Respiratory:  Negative for cough, chest tightness and shortness of breath (present, but unchanged from baseline).   Cardiovascular:  Negative for chest pain, palpitations and leg swelling.  Gastrointestinal:  Negative for abdominal pain, blood in stool, constipation, diarrhea, nausea and vomiting.  Endocrine: Negative for polydipsia, polyphagia and polyuria.  Genitourinary:  Negative for dysuria and flank pain.  Musculoskeletal:  Negative for arthralgias, back pain, joint swelling, myalgias and neck stiffness.  Skin:  Negative for color change, rash and wound.  Neurological:  Negative for dizziness, tremors, seizures, speech difficulty, weakness, light-headedness and headaches.  Psychiatric/Behavioral:  Negative for behavioral problems, confusion, decreased concentration, dysphoric mood and sleep disturbance. The patient is not nervous/anxious.   All other systems reviewed and are negative.        Objective    BP 135/70 (BP Location: Left Arm, Patient Position: Sitting, Cuff Size: Normal)   Pulse 71   Temp 98.2 F (36.8 C) (Oral)   Ht 5\' 9"  (1.753 m)   Wt 139 lb (63 kg)   SpO2 98%   BMI 20.53 kg/m      Physical Exam Vitals and nursing note reviewed.  Constitutional:      General: He is awake.     Appearance: Normal appearance. He is well-groomed. He is cachectic.  HENT:     Head: Normocephalic and atraumatic.     Right Ear: External ear normal. There is no impacted cerumen.     Left Ear: External ear normal. There is no impacted cerumen.     Nose: Nose normal.     Mouth/Throat:     Mouth: Mucous membranes are moist.     Pharynx: Oropharynx is clear. No oropharyngeal exudate or posterior oropharyngeal erythema.  Eyes:     General: No scleral icterus.    Extraocular Movements: Extraocular movements intact.     Conjunctiva/sclera: Conjunctivae normal.     Pupils: Pupils are equal, round, and reactive  to light.  Neck:     Thyroid: No thyromegaly or thyroid tenderness.  Cardiovascular:     Rate and Rhythm: Normal rate and regular rhythm.     Pulses: Normal pulses.     Heart sounds: Normal heart sounds.  Pulmonary:     Effort: Pulmonary effort is normal. No tachypnea, bradypnea or respiratory distress.     Breath sounds: Normal breath sounds. No stridor. No wheezing, rhonchi or rales.  Abdominal:     General: Bowel sounds are normal. There is no distension.     Palpations: Abdomen is soft. There is no mass.     Tenderness: There is no abdominal tenderness. There is no guarding.     Hernia: No hernia is present.  Musculoskeletal:     Cervical back: Normal range of motion and neck supple.     Right lower leg: Edema (trace) present.  Left lower leg: Edema (trace) present.  Lymphadenopathy:     Cervical: No cervical adenopathy.  Skin:    General: Skin is warm and dry.     Findings: Rash (scalp, white plaque with underlying redness) present.     Comments: Varicosities noted scattered around ankles bilaterally  Neurological:     Mental Status: He is alert and oriented to person, place, and time. Mental status is at baseline.  Psychiatric:        Mood and Affect: Mood normal.        Behavior: Behavior normal.      No results found for any visits on 07/19/23.  Assessment & Plan    Annual physical exam Assessment & Plan: Physical exam with abnormalities as noted.  Patient is not due for blood work at this time, as he had it done recently by his oncologist.   Scalp psoriasis Assessment & Plan: Prescribed clobetasol solution as noted below.  Advised patient to focus on twice daily until lesions improved, at which point he may decrease to once daily.  Orders: -     Clobetasol Propionate; Apply 1 Application topically 2 (two) times daily.  Dispense: 50 mL; Refill: 0  Malignant neoplasm of prostate [facility]) Assessment & Plan: Undergoing active management by Dr. Cathie Hoops.   Other  emphysema [facility]) Assessment & Plan: Patient is currently endorses that symptoms are well-controlled on the Advair.  I did offer the patient referral to pulmonology for specialist evaluation and recommendations, which he declined.   Hypercholesteremia Assessment & Plan: Patient does have a high total and LDL cholesterol, as of his previous blood work in 09/11/2018.  However, given his advanced age and current cancer treatment with palliative therapy following, it would not be appropriate to follow this in an attempt to treat it.  Did not counsel patient regarding diet since generally at the end of his life and diet adjustment would cause undue stress with little likely benefit at this point.     Return in about 1 year (around 07/18/2024) for CPE.      The entirety of the information documented in the History of Present Illness, Review of Systems and Physical Exam were personally obtained by me. Portions of this information were initially documented by the CMA, Adline Peals, and reviewed by me for thoroughness and accuracy.   I discussed the assessment and treatment plan with the patient  The patient was provided an opportunity to ask questions and all were answered. The patient agreed with the plan and demonstrated an understanding of the instructions.   The patient was advised to call back or seek an in-person evaluation if the symptoms worsen or if the condition fails to improve as anticipated.     Sherlyn Hay, DO  [facility] [phone] (phone) [phone] (fax)  [facility]

## 2023-07-19 NOTE — Assessment & Plan Note (Deleted)
Undergoing active management by Dr. Cathie Hoops.

## 2023-07-20 ENCOUNTER — Encounter: Payer: Self-pay | Admitting: Family Medicine

## 2023-07-25 ENCOUNTER — Telehealth: Payer: Self-pay | Admitting: *Deleted

## 2023-07-25 NOTE — Patient Outreach (Signed)
  Care Coordination   Follow Up Visit Note   07/26/2023 Name: Sean Wood. MRN: [id number] DOB: Jul 08, 1936  Sean Hams. is a 87 y.o. year old male who sees Pardue, Monico Blitz, DO for primary care. I spoke with  Sean Hams. by phone today.  What matters to the patients health and wellness today?  Continue to keep COPD managed    Goals Addressed             This Visit's Progress    Effective management of COPD   On track    Care Coordination Interventions: Provided patient with basic written and verbal COPD education on self care/management/and exacerbation prevention Advised patient to track and manage COPD triggers Advised patient to self assesses COPD action plan zone and make appointment with provider if in the yellow zone for 48 hours without improvement Provided education about and advised patient to utilize infection prevention strategies to reduce risk of respiratory infection         SDOH assessments and interventions completed:  No     Care Coordination Interventions:  Yes, provided   Interventions Today    Flowsheet Row Most Recent Value  Chronic Disease   Chronic disease during today's visit Chronic Obstructive Pulmonary Disease (COPD)  General Interventions   General Interventions Discussed/Reviewed General Interventions Reviewed, Doctor Visits  Doctor Visits Discussed/Reviewed Doctor Visits Reviewed, Annual Wellness Visits, PCP, Specialist  [AWV done on 7/30, recommend follow up with PCP in a year, oncology 10/31]  PCP/Specialist Visits Compliance with follow-up visit  Education Interventions   Education Provided Provided Education  Provided Verbal Education On Medication, When to see the doctor, Other  [Using inhalers and nebulizers as instructed]       Follow up plan: Follow up call scheduled for 11/4    Encounter Outcome:  Pt. Visit Completed   Kemper Durie, RN, MSN, [facility] Care Management Care Management  Coordinator [phone]

## 2023-08-11 ENCOUNTER — Ambulatory Visit: Payer: Self-pay

## 2023-08-11 NOTE — Telephone Encounter (Signed)
Summary: stated about a week ago his sugar dropped and he was staggering around like a drunk man.   Pt stated he is not feeling well, and he needs blood work done for his sugar. He stated that about a week ago his sugar dropped and he was staggering around like a drunk man. I asked pt if he has checked his blood sugar; pt stated no, I have no way to check.   Stated at this moment, he feels alright. Pt stated he wants to come in next week.  Seeking clinical advice.         Chief Complaint: Feels weak, has episodes when he thinks "blood sugar drops, I eat something and I feel better.' Symptoms: Above Frequency: Years Pertinent Negatives: Patient denies  Disposition: [] ED /[] Urgent Care (no appt availability in office) / [x] Appointment(In office/virtual)/ []  [facility]/ [] Home Care/ [] Refused Recommended Disposition /[] [facility]/ []  Follow-up with PCP Additional Notes: Pt. Only wants to see Dr. Payton Mccallum. Agrees with appointment. Instructed to call back for worsening of symptoms.  Reason for Disposition  Weakness is a chronic symptom (recurrent or ongoing AND present > 4 weeks)  Answer Assessment - Initial Assessment Questions 1. DESCRIPTION: "Describe how you are feeling."     "Feel bad, weak" 2. SEVERITY: "How bad is it?"  "Can you stand and walk?"   - MILD (0-3): Feels weak or tired, but does not interfere with work, school or normal activities.   - MODERATE (4-7): Able to stand and walk; weakness interferes with work, school, or normal activities.   - SEVERE (8-10): Unable to stand or walk; unable to do usual activities.     Moderate 3. ONSET: "When did these symptoms begin?" (e.g., hours, days, weeks, months)     Years ago 4. CAUSE: "What do you think is causing the weakness or fatigue?" (e.g., not drinking enough fluids, medical problem, trouble sleeping)     Low blood sugar 5. NEW MEDICINES:  "Have you started on any new medicines recently?" (e.g., opioid  pain medicines, benzodiazepines, muscle relaxants, antidepressants, antihistamines, neuroleptics, beta blockers)     No 6. OTHER SYMPTOMS: "Do you have any other symptoms?" (e.g., chest pain, fever, cough, SOB, vomiting, diarrhea, bleeding, other areas of pain)     Feels queasy and staggers 7. PREGNANCY: "Is there any chance you are pregnant?" "When was your last menstrual period?"     N/a  Protocols used: Weakness (Generalized) and Fatigue-A-AH

## 2023-08-16 ENCOUNTER — Other Ambulatory Visit (HOSPITAL_COMMUNITY): Payer: Self-pay

## 2023-08-16 ENCOUNTER — Other Ambulatory Visit: Payer: Self-pay

## 2023-08-17 ENCOUNTER — Other Ambulatory Visit (HOSPITAL_COMMUNITY): Payer: Self-pay

## 2023-08-29 ENCOUNTER — Ambulatory Visit (INDEPENDENT_AMBULATORY_CARE_PROVIDER_SITE_OTHER): Payer: PPO | Admitting: Family Medicine

## 2023-08-29 ENCOUNTER — Encounter: Payer: Self-pay | Admitting: Family Medicine

## 2023-08-29 VITALS — BP 118/67 | HR 72 | Temp 98.1°F | Ht 69.0 in | Wt 139.0 lb

## 2023-08-29 DIAGNOSIS — Z23 Encounter for immunization: Secondary | ICD-10-CM

## 2023-08-29 DIAGNOSIS — Z1329 Encounter for screening for other suspected endocrine disorder: Secondary | ICD-10-CM | POA: Insufficient documentation

## 2023-08-29 DIAGNOSIS — R531 Weakness: Secondary | ICD-10-CM | POA: Insufficient documentation

## 2023-08-29 DIAGNOSIS — K921 Melena: Secondary | ICD-10-CM | POA: Diagnosis not present

## 2023-08-29 MED ORDER — ONETOUCH ULTRASOFT LANCETS MISC: 0 refills | Status: DC

## 2023-08-29 NOTE — Patient Instructions (Signed)
Please check blood sugars at home and write them down. Also write how you were feeling at the time.

## 2023-08-29 NOTE — Progress Notes (Unsigned)
Established patient visit   Patient: Sean Wood.   DOB: Dec 08, 1936   87 y.o. Male  MRN: [id number] Visit Date: 08/29/2023  Today's healthcare provider: Sherlyn Hay, DO   Chief Complaint  Patient presents with   Hyperlipidemia   Hypoglycemia    Patient has episodes that he describes as feeling shaky, nervous, sweaty and nauseated.  He reports that when this happens he will eat peanut butter and it gets better.   Subjective    HPI Thinks episodes have been occurring very infrequently over the last 8-9 years  - originally went 9 months or so in between episodes  - 3 times in the last two weeks   - per patient, he "stagger[s] around like a drunk man" when this occurs  - eats breakfast when hungry (around 6:30am), dinner around 12pm to 1pm and supper around 6pm  - blood glucose last night 99, checked by daughter   - late-wife had a glucometer, which he found but does not have lancets for  Blood in stool   - sometimes a lot, sometimes a little  - brown or black  - 4 months, has worsened   - has BM every morning   {History (Optional):23778}  Medications: Outpatient Medications Prior to Visit  Medication Sig   albuterol (VENTOLIN HFA) 108 (90 Base) MCG/ACT inhaler Inhale 2 puffs into the lungs every 6 (six) hours as needed for wheezing or shortness of breath.   apalutamide (ERLEADA) 60 MG tablet Take 1 tablet (60 mg total) by mouth daily.   calcium-vitamin D (OSCAL WITH D) 500-5 MG-MCG tablet Take 2 tablets by mouth daily.   cholecalciferol (VITAMIN D3) 25 MCG (1000 UNIT) tablet Take 1,000 Units by mouth daily.   clobetasol (TEMOVATE) 0.05 % external solution Apply 1 Application topically 2 (two) times daily.   cyanocobalamin (VITAMIN B12) 1000 MCG tablet Take 1,000 mcg by mouth daily.   fluticasone-salmeterol (ADVAIR DISKUS) 250-50 MCG/ACT AEPB Inhale 1 puff into the lungs in the morning and at bedtime.   No facility-administered medications prior to visit.     Review of Systems  Constitutional:  Negative for appetite change, chills and fever.  Eyes:  Negative for visual disturbance.  Respiratory:  Negative for chest tightness, shortness of breath and wheezing.   Cardiovascular:  Negative for chest pain and palpitations.  Gastrointestinal:  Positive for blood in stool. Negative for abdominal distention, abdominal pain, constipation, diarrhea, nausea and vomiting.  Neurological:  Positive for dizziness (with low BG) and weakness (intermittent; 3x in past two weeks).    {Insert previous labs (optional):23779} {See past labs  Heme  Chem  Endocrine  Serology  Results Review (optional):1}   Objective    BP 118/67 (BP Location: Left Arm, Patient Position: Sitting, Cuff Size: Normal)   Pulse 72   Temp 98.1 F (36.7 C) (Oral)   Ht 5\' 9"  (1.753 m)   Wt 139 lb (63 kg)   SpO2 98%   BMI 20.53 kg/m  {Insert last BP/Wt (optional):23777}{See vitals history (optional):1}   Physical Exam Constitutional:      Appearance: Normal appearance.  HENT:     Head: Normocephalic and atraumatic.  Eyes:     General: No scleral icterus.    Extraocular Movements: Extraocular movements intact.     Conjunctiva/sclera: Conjunctivae normal.  Cardiovascular:     Rate and Rhythm: Normal rate and regular rhythm.     Pulses: Normal pulses.     Heart sounds: Normal  heart sounds.  Pulmonary:     Effort: Pulmonary effort is normal. No respiratory distress.     Breath sounds: Normal breath sounds.  Abdominal:     General: Bowel sounds are normal. There is no distension.     Palpations: Abdomen is soft. There is no mass.     Tenderness: There is no abdominal tenderness. There is no guarding.  Musculoskeletal:     Right lower leg: No edema.     Left lower leg: No edema.  Skin:    General: Skin is warm and dry.  Neurological:     Mental Status: He is alert and oriented to person, place, and time. Mental status is at baseline.  Psychiatric:        Mood and  Affect: Mood normal.        Behavior: Behavior normal.      No results found for any visits on 08/29/23.  Assessment & Plan    Weakness -     Basic metabolic panel -     CBC -     OneTouch UltraSoft Lancets; Use to check sugar daily for type 2 diabetes E11.9  Dispense: 100 each; Refill: 0  Frank blood in stool -     Basic metabolic panel -     CBC -     Ambulatory referral to Gastroenterology  Screening for endocrine disorder  Need for influenza vaccination -     Flu Vaccine QUAD High Dose(Fluad)    Return in about 2 weeks (around 09/12/2023) for weakness/dizziness.      I discussed the assessment and treatment plan with the patient  The patient was provided an opportunity to ask questions and all were answered. The patient agreed with the plan and demonstrated an understanding of the instructions.   The patient was advised to call back or seek an in-person evaluation if the symptoms worsen or if the condition fails to improve as anticipated.    Sherlyn Hay, DO  [facility] [phone] (phone) [phone] (fax)  [facility]

## 2023-08-30 ENCOUNTER — Encounter: Payer: Self-pay | Admitting: Family Medicine

## 2023-08-30 LAB — BASIC METABOLIC PANEL
BUN/Creatinine Ratio: 25 — ABNORMAL HIGH (ref 10–24)
BUN: 19 mg/dL (ref 8–27)
CO2: 29 mmol/L (ref 20–29)
Calcium: 9.2 mg/dL (ref 8.6–10.2)
Chloride: 103 mmol/L (ref 96–106)
Creatinine, Ser: 0.77 mg/dL (ref 0.76–1.27)
Glucose: 93 mg/dL (ref 70–99)
Potassium: 4.5 mmol/L (ref 3.5–5.2)
Sodium: 145 mmol/L — ABNORMAL HIGH (ref 134–144)
eGFR: 88 mL/min/{1.73_m2} (ref >59)

## 2023-08-30 LAB — CBC
Hematocrit: 42.8 % (ref 37.5–51.0)
Hemoglobin: 13.9 g/dL (ref 13.0–17.7)
MCH: 28.3 pg (ref 26.6–33.0)
MCHC: 32.5 g/dL (ref 31.5–35.7)
MCV: 87 fL (ref 79–97)
Platelets: 153 10*3/uL (ref 150–450)
RBC: 4.91 x10E6/uL (ref 4.14–5.80)
RDW: 12.7 % (ref 11.6–15.4)
WBC: 5.6 10*3/uL (ref 3.4–10.8)

## 2023-08-30 NOTE — Assessment & Plan Note (Signed)
Patient did admit to frank blood in his stool which has been ongoing for 4 months but has worsened over the past month.  He does not note blood when wiping and denies coffee-ground emesis.  He denies use of Pepto-Bismol but does occasionally experience black stools.  Attempted to discuss goals of care with patient, regarding whether he is interested in pursuing treatment of this problem.  However, we were unable to reach a conclusion.  Will refer to gastroenterology for further evaluation and to allow for more information to be gathered for the patient to make a decision regarding his desire to pursue care.

## 2023-08-30 NOTE — Assessment & Plan Note (Addendum)
Patient endorses intermittent weakness, stating he staggers around "drunkly" when these occur.  He notes that they seem to be improved by eating but has not had a documented low blood sugar.  However, he does sit down to eat when this occurs.  It is unclear whether his symptoms stem from hypoglycemia versus anemia and related hypoxia that improves with rest.  Will check a BMP and CBC today to evaluate this. I prescribed lancets so that the patient may check his blood sugar using the glucometer he has at home.  Encouraged him to do this with his daughter's help and bring in a log of what his blood sugars are over the upcoming 2 weeks prior to his follow-up.  Strongly recommended he check this particularly when he has an episode.  Patient expressed understanding.

## 2023-09-05 ENCOUNTER — Telehealth: Payer: Self-pay

## 2023-09-05 MED ORDER — LANCETS MISC. MISC: 1.0000 | Freq: Three times a day (TID) | 0 refills | Status: AC

## 2023-09-05 MED ORDER — BLOOD GLUCOSE MONITORING SUPPL DEVI: 1.0000 | Freq: Three times a day (TID) | 0 refills | Status: DC

## 2023-09-05 MED ORDER — LANCET DEVICE MISC: 1.0000 | Freq: Three times a day (TID) | 0 refills | Status: AC

## 2023-09-05 NOTE — Telephone Encounter (Signed)
Copied from CRM [id number]. Topic: General - Other >> Sep 05, 2023 10:08 AM Macon Large wrote: Reason for CRM: Pt daughter Camelia Eng stated pt was prescribed lancets but no there was no Rx for either a glucose meter or test strips.

## 2023-09-06 ENCOUNTER — Other Ambulatory Visit (HOSPITAL_COMMUNITY): Payer: Self-pay

## 2023-09-12 ENCOUNTER — Other Ambulatory Visit: Payer: Self-pay

## 2023-09-19 ENCOUNTER — Other Ambulatory Visit: Payer: Self-pay

## 2023-09-20 ENCOUNTER — Encounter: Payer: Self-pay | Admitting: Family Medicine

## 2023-09-20 ENCOUNTER — Ambulatory Visit (INDEPENDENT_AMBULATORY_CARE_PROVIDER_SITE_OTHER): Payer: PPO | Admitting: Family Medicine

## 2023-09-20 VITALS — BP 130/67 | HR 70 | Ht 69.0 in | Wt 139.8 lb

## 2023-09-20 DIAGNOSIS — L409 Psoriasis, unspecified: Secondary | ICD-10-CM | POA: Diagnosis not present

## 2023-09-20 DIAGNOSIS — J438 Other emphysema: Secondary | ICD-10-CM | POA: Diagnosis not present

## 2023-09-20 DIAGNOSIS — R531 Weakness: Secondary | ICD-10-CM | POA: Diagnosis not present

## 2023-09-20 DIAGNOSIS — K921 Melena: Secondary | ICD-10-CM | POA: Diagnosis not present

## 2023-09-20 MED ORDER — CLOBETASOL PROPIONATE 0.05 % EX CREA
1.0000 | TOPICAL_CREAM | Freq: Two times a day (BID) | CUTANEOUS | 0 refills | Status: DC
Start: 2023-09-20 — End: 2023-12-28

## 2023-09-20 NOTE — Assessment & Plan Note (Addendum)
Tries to place clobetasol solution 1-2 x per day. Doesn't always apply. Counseled patient to apply his medication as prescribed consistently when symptoms occur.

## 2023-09-20 NOTE — Patient Instructions (Signed)
Your labwork showed you were a little dehydrated; please try to increase your fluid intake.

## 2023-09-20 NOTE — Assessment & Plan Note (Signed)
Discussed that labs showed he was a little dehydrated. Counseled patient to drink more water

## 2023-09-20 NOTE — Assessment & Plan Note (Signed)
Takes the Advair intermittently, not as scheduled. Only when he feels like he is becoming short-winded quickly.

## 2023-09-20 NOTE — Progress Notes (Signed)
Established patient visit   Patient: Sean Wood.   DOB: Sep 09, 1936   87 y.o. Male  MRN: [id number] Visit Date: 09/20/2023  Today's healthcare provider: Sherlyn Hay, DO   Chief Complaint  Patient presents with   Medical Management of Chronic Issues    Follow up for weakness and dizziness, had no symptoms of that lately, would like to go over previous lab work no new concerns today   Subjective    HPI Last Annual Exam: 07/19/2023  Appointment scheduled with gastroenterology 09/22/2023 Dizziness is better  - says only gets dizzy when his sugar drops  - has trouble using his glucometer (only got it work once)  - his daughter checked once and it was 128.  - he checked once and it was 108. Last episode of dizziness was before previous visit  Rarely drinks water; does drink a few sips when he passes the sink Drinks 8 oz cranberry juice every morning Occasionally drinks gatorade. Drinks one 12 oz bottle every day. Likes cornbread milk (combination of cornbread and milk); doesn't drink it much Doesn't drinks sodas  Compliance with Advair?  - Takes only when he experiences symptoms.    Medications: Outpatient Medications Prior to Visit  Medication Sig   albuterol (VENTOLIN HFA) 108 (90 Base) MCG/ACT inhaler Inhale 2 puffs into the lungs every 6 (six) hours as needed for wheezing or shortness of breath.   apalutamide (ERLEADA) 60 MG tablet Take 1 tablet (60 mg total) by mouth daily.   B Complex Vitamins (B COMPLEX VITAMIN PO) Take by mouth.   Blood Glucose Monitoring Suppl DEVI 1 each by Does not apply route in the morning, at noon, and at bedtime. May substitute to any manufacturer covered by patient's insurance.   calcium-vitamin D (OSCAL WITH D) 500-5 MG-MCG tablet Take 2 tablets by mouth daily.   cholecalciferol (VITAMIN D3) 25 MCG (1000 UNIT) tablet Take 1,000 Units by mouth daily.   clobetasol (TEMOVATE) 0.05 % external solution Apply 1 Application topically  2 (two) times daily.   co-enzyme Q-10 30 MG capsule COENZYME Q10 (NON-VA MED) CAP/TAB                                      Non-VA                                                                        Feb 03, 2007                                        Non-VA  Documented by:  Hollie Beach                                    Documented at:  [facility]   cyanocobalamin (VITAMIN B12) 1000 MCG tablet Take 1,000 mcg by mouth daily.   fluticasone-salmeterol (ADVAIR DISKUS) 250-50 MCG/ACT AEPB Inhale 1 puff into the lungs in the morning and at bedtime.   ibuprofen (ADVIL) 100 MG tablet Take by mouth.   [EXPIRED] Lancet Device MISC 1 each by Does not apply route in the morning, at noon, and at bedtime. May substitute to any manufacturer covered by patient's insurance.   Lancets (ONETOUCH ULTRASOFT) lancets Use to check sugar daily for type 2 diabetes E11.9   [EXPIRED] Lancets Misc. MISC 1 each by Does not apply route in the morning, at noon, and at bedtime. May substitute to any manufacturer covered by patient's insurance.   No facility-administered medications prior to visit.    Review of Systems  Constitutional:  Negative for appetite change, chills, fever and unexpected weight change.  Respiratory:  Positive for shortness of breath (little bit). Negative for cough and wheezing.   Cardiovascular:  Negative for chest pain, palpitations and leg swelling.  Gastrointestinal:  Positive for blood in stool. Negative for abdominal pain, nausea and vomiting.  Neurological:  Negative for weakness, numbness and headaches.  Hematological:  Does not bruise/bleed easily.        Objective    BP 130/67 (BP Location: Left Arm, Patient Position: Sitting, Cuff Size: Large)   Pulse 70   Ht 5\' 9"  (1.753 m)   Wt 139 lb 12.8 oz (63.4 kg)   SpO2 98%   BMI 20.64 kg/m     Physical  Exam Vitals and nursing note reviewed.  Constitutional:      General: He is not in acute distress.    Appearance: Normal appearance.  HENT:     Head: Normocephalic and atraumatic.      Comments: Psoriasis present (reddended, some scabbing) Eyes:     General: No scleral icterus.    Conjunctiva/sclera: Conjunctivae normal.  Cardiovascular:     Rate and Rhythm: Normal rate.  Pulmonary:     Effort: Pulmonary effort is normal.  Neurological:     Mental Status: He is alert and oriented to person, place, and time. Mental status is at baseline.  Psychiatric:        Mood and Affect: Mood normal.        Behavior: Behavior normal.       No results found for any visits on 09/20/23.  Assessment & Plan    Weakness Assessment & Plan: Discussed that labs showed he was a little dehydrated. Counseled patient to drink more water   Frank blood in stool Assessment & Plan: Continues to have same amount of blood in stool. Appointment scheduled with gastroenterology 09/22/2023    Other emphysema [facility]) Assessment & Plan: Takes the Advair intermittently, not as scheduled. Only when he feels like he is becoming short-winded quickly.   Scalp psoriasis Assessment & Plan: Tries to place clobetasol solution 1-2 x per day. Doesn't always apply. Counseled patient to apply his medication as prescribed consistently when symptoms occur.   Psoriasis Assessment & Plan: Prescribed clobetasol cream for patient to psoriatic lesions on arms and legs as needed.  Orders: -     Clobetasol Propionate; Apply 1 Application topically 2 (two) times daily.  Dispense: 30  g; Refill: 0    Return in about 10 months (around 07/20/2024) for CPE.      I discussed the assessment and treatment plan with the patient  The patient was provided an opportunity to ask questions and all were answered. The patient agreed with the plan and demonstrated an understanding of the instructions.   The patient was advised to call  back or seek an in-person evaluation if the symptoms worsen or if the condition fails to improve as anticipated.    Sherlyn Hay, DO  [facility] [phone] (phone) [phone] (fax)  [facility]

## 2023-09-20 NOTE — Assessment & Plan Note (Signed)
Continues to have same amount of blood in stool. Appointment scheduled with gastroenterology 09/22/2023

## 2023-09-21 NOTE — Progress Notes (Signed)
[name], [name] [address]  [address]  [place], [place] [address]  Main: [phone]  Fax: [phone]   Gastroenterology Consultation  Referring Provider:     Sherlyn Hay, DO Primary Care Physician:  Sherlyn Hay, DO Primary Gastroenterologist:  [name], [name] / Dr. Midge Minium   Reason for Consultation:     Rectal bleeding        HPI:   Sean Wood. is a 87 y.o. y/o male referred for consultation & management  by Sherlyn Hay, DO.    Patient states he is having rectal bleeding for 4 months.  Red blood.  He denies black stools.  Rectal bleeding has become more frequent in the past month.  He is seeing red blood in the toilet daily after bowel movement.  Sometimes has blood without a bowel movement.  He denies abdominal pain, diarrhea, constipation, rectal pain, heartburn, or dysphagia.  He has lost 25 pounds, unintentional, since his wife passed away 04/28/2022.  Weight has dropped from 165 down to 139 lb.  Lab 08/29/2023 showed normal CBC with hemoglobin 13.9.  Patient states he had 1 previous colonoscopy over 10 years ago at [facility] in [place].  Report unavailable.  He does not take any NSAIDs, aspirin, or blood thinners.  Has history of mild emphysema.  Quit smoking in 1982.  Continues to chew tobacco.    Past Medical History:  Diagnosis Date   Arthritis    COPD (chronic obstructive pulmonary disease) (HCC)    Elevated PSA    Hematuria, gross    History of hiatal hernia    HOH (hard of hearing)    Kidney stone    Prostate cancer (HCC)    Shortness of breath dyspnea     Past Surgical History:  Procedure Laterality Date   BACK SURGERY  1973, 1987   CYSTOSCOPY  06/24/2015   Procedure: CYSTOSCOPY;  Surgeon: Vanna Scotland, MD;  Location: [facility] ORS;  Service: Urology;;   HERNIA REPAIR     left and right inguinal hernia repair   RADIOACTIVE SEED IMPLANT N/A 06/24/2015   Procedure: RADIOACTIVE SEED IMPLANT/BRACHYTHERAPY IMPLANT;  Surgeon:  Vanna Scotland, MD;  Location: [facility] ORS;  Service: Urology;  Laterality: N/A;    Prior to Admission medications   Medication Sig Start Date End Date Taking? Authorizing Provider  albuterol (VENTOLIN HFA) 108 (90 Base) MCG/ACT inhaler Inhale 2 puffs into the lungs every 6 (six) hours as needed for wheezing or shortness of breath. 07/14/23   Rickard Patience, MD  apalutamide (ERLEADA) 60 MG tablet Take 1 tablet (60 mg total) by mouth daily. 07/14/23   Rickard Patience, MD  B Complex Vitamins (B COMPLEX VITAMIN PO) Take by mouth. 02/03/07   [provider]  Blood Glucose Monitoring Suppl DEVI 1 each by Does not apply route in the morning, at noon, and at bedtime. May substitute to any manufacturer covered by patient's insurance. 09/05/23   Sherlyn Hay, DO  calcium-vitamin D (OSCAL WITH D) 500-5 MG-MCG tablet Take 2 tablets by mouth daily. 06/10/22   Rickard Patience, MD  cholecalciferol (VITAMIN D3) 25 MCG (1000 UNIT) tablet Take 1,000 Units by mouth daily.    [provider]  clobetasol (TEMOVATE) 0.05 % external solution Apply 1 Application topically 2 (two) times daily. 07/19/23   Sherlyn Hay, DO  clobetasol cream (TEMOVATE) 0.05 % Apply 1 Application topically 2 (two) times daily. 09/20/23   Sherlyn Hay, DO  co-enzyme Q-10  30 MG capsule COENZYME Q10 (NON-VA MED) CAP/TAB                                      Non-VA                                                                        Feb 03, 2007                                        Non-VA                                                                                                          Documented by:  Hollie Beach                                    Documented at:  [facility] 02/03/07   [provider]  cyanocobalamin (VITAMIN B12) 1000 MCG tablet Take 1,000 mcg by mouth daily.    [provider]  fluticasone-salmeterol (ADVAIR DISKUS) 250-50 MCG/ACT AEPB Inhale 1 puff into the lungs in the morning and  at bedtime. 07/14/23   Rickard Patience, MD  ibuprofen (ADVIL) 100 MG tablet Take by mouth. 12/10/05   [provider]  Lancet Device MISC 1 each by Does not apply route in the morning, at noon, and at bedtime. May substitute to any manufacturer covered by patient's insurance. 09/05/23 10/05/23  Sherlyn Hay, DO  Lancets (ONETOUCH ULTRASOFT) lancets Use to check sugar daily for type 2 diabetes E11.9 08/29/23   Sherlyn Hay, DO  Lancets Misc. MISC 1 each by Does not apply route in the morning, at noon, and at bedtime. May substitute to any manufacturer covered by patient's insurance. 09/05/23 10/05/23  Sherlyn Hay, DO    Family History  Problem Relation Age of Onset   Cancer Brother    Heart disease Brother    Diabetes Brother    Emphysema Brother    Prostate cancer Brother    Diabetes Sister    Emphysema Mother    Healthy Sister    Healthy Sister    Healthy Sister    Diabetes Brother    Diabetes Brother    Diabetes Brother    Healthy Brother      Social History   Tobacco Use   Smoking status: Former    Current packs/day: 0.00    Types: Cigarettes    Quit date: 05/24/1981    Years since quitting: 42.3   Smokeless tobacco: Current    Types:  Chew  Vaping Use   Vaping status: Never Used  Substance Use Topics   Alcohol use: Not Currently    Alcohol/week: 0.0 standard drinks of alcohol   Drug use: No    Allergies as of 09/22/2023 - Review Complete 09/22/2023  Allergen Reaction Noted   Mevacor [lovastatin] Other (See Comments) 05/16/2015   Niacin and related Other (See Comments) 04/24/2015   Statins Other (See Comments) 04/24/2015   Aleve [naproxen sodium] Rash 04/24/2015    Review of Systems:    All systems reviewed and negative except where noted in HPI.   Physical Exam:  BP 118/70   Pulse 73   Temp (!) 97.5 F (36.4 C)   Ht 5\' 9"  (1.753 m)   Wt 140 lb 12.8 oz (63.9 kg)   BMI 20.79 kg/m  No LMP for male patient.  General:   Alert,  Well-developed,  well-nourished, pleasant and cooperative in NAD Lungs:  Respirations even and unlabored.  Clear throughout to auscultation.   No wheezes, crackles, or rhonchi. No acute distress. Heart:  Regular rate and rhythm; no murmurs, clicks, rubs, or gallops. Abdomen:  Normal bowel sounds.  No bruits.  Soft, and non-distended without masses, hepatosplenomegaly or hernias noted.  No Tenderness.  No guarding or rebound tenderness.    Neurologic:  Alert and oriented x3;  grossly normal neurologically. Psych:  Alert and cooperative. Normal mood and affect.  Imaging Studies: No results found.  Assessment and Plan:   Sean Wood. is a 87 y.o. y/o male has been referred for rectal bleeding for 4 months, worsening for 1 month.  He has also had unintentional weight loss.  I am concerned about colorectal cancer.  Scheduling colonoscopy ASAP.  1.  Rectal bleeding  Scheduling Colonoscopy I discussed risks of colonoscopy with patient to include risk of bleeding, colon perforation, and risk of sedation.  Patient expressed understanding and agrees to proceed with colonoscopy.   2.  Weight loss  Labs:  CBC, CMP, TSH  Follow up 4 weeks after colonoscopy with TG.  [name], [name]

## 2023-09-22 ENCOUNTER — Ambulatory Visit: Payer: PPO | Admitting: Physician Assistant

## 2023-09-22 ENCOUNTER — Encounter: Payer: Self-pay | Admitting: Physician Assistant

## 2023-09-22 ENCOUNTER — Encounter: Payer: Self-pay | Admitting: Oncology

## 2023-09-22 VITALS — BP 118/70 | HR 73 | Temp 97.5°F | Ht 69.0 in | Wt 140.8 lb

## 2023-09-22 DIAGNOSIS — K625 Hemorrhage of anus and rectum: Secondary | ICD-10-CM | POA: Diagnosis not present

## 2023-09-22 DIAGNOSIS — R634 Abnormal weight loss: Secondary | ICD-10-CM | POA: Diagnosis not present

## 2023-09-22 MED ORDER — PEG 3350-KCL-NA BICARB-NACL 420 G PO SOLR: 4000.0000 mL | Freq: Once | ORAL | 0 refills | Status: AC

## 2023-09-23 ENCOUNTER — Telehealth: Payer: Self-pay

## 2023-09-23 ENCOUNTER — Other Ambulatory Visit: Payer: Self-pay

## 2023-09-23 LAB — COMPREHENSIVE METABOLIC PANEL
ALT: 5 [IU]/L (ref 0–44)
AST: 14 [IU]/L (ref 0–40)
Albumin: 4.2 g/dL (ref 3.7–4.7)
Alkaline Phosphatase: 84 [IU]/L (ref 44–121)
BUN/Creatinine Ratio: 17 (ref 10–24)
BUN: 15 mg/dL (ref 8–27)
Bilirubin Total: <0.2 mg/dL (ref 0.0–1.2)
CO2: 29 mmol/L (ref 20–29)
Calcium: 9.2 mg/dL (ref 8.6–10.2)
Chloride: 102 mmol/L (ref 96–106)
Creatinine, Ser: 0.87 mg/dL (ref 0.76–1.27)
Globulin, Total: 1.9 g/dL (ref 1.5–4.5)
Glucose: 92 mg/dL (ref 70–99)
Potassium: 4.6 mmol/L (ref 3.5–5.2)
Sodium: 145 mmol/L — ABNORMAL HIGH (ref 134–144)
Total Protein: 6.1 g/dL (ref 6.0–8.5)
eGFR: 85 mL/min/{1.73_m2} (ref >59)

## 2023-09-23 LAB — CBC
Hematocrit: 41 % (ref 37.5–51.0)
Hemoglobin: 13.4 g/dL (ref 13.0–17.7)
MCH: 28.1 pg (ref 26.6–33.0)
MCHC: 32.7 g/dL (ref 31.5–35.7)
MCV: 86 fL (ref 79–97)
Platelets: 254 10*3/uL (ref 150–450)
RBC: 4.77 x10E6/uL (ref 4.14–5.80)
RDW: 12.6 % (ref 11.6–15.4)
WBC: 4.7 10*3/uL (ref 3.4–10.8)

## 2023-09-23 LAB — TSH: TSH: 1.1 u[IU]/mL (ref 0.450–4.500)

## 2023-09-23 NOTE — Progress Notes (Signed)
Please call and notify patient labs show: All labs are normal.  Normal CBC, CMP, and TSH.  Hemoglobin is normal, 13.4, no anemia.  Kidney test, liver test, blood sugar, and thyroid test are all normal.  Continue with current plan for colonoscopy as scheduled. [name], [name]

## 2023-09-23 NOTE — Telephone Encounter (Signed)
Left message for patient to return call to office- he is currently scheduled 1015-24 colonoscopy -with Dr.Wohl- we need to change the date as Dr.Wohl is not at [facility] this day.

## 2023-09-23 NOTE — Telephone Encounter (Signed)
Rescheduled Colonoscopy from 10-04-23 to 10-05-23- mailed new paperwork but also reminded patient that bowel prep will change by one day- Spoke with Raynelle Fanning endoscopy unti to have patient moved to 10-05-23

## 2023-09-23 NOTE — Telephone Encounter (Signed)
Patient daughter Camelia Eng) called back to speak with Mrs. Gerre Pebbles.

## 2023-09-26 ENCOUNTER — Telehealth: Payer: Self-pay

## 2023-09-26 NOTE — Telephone Encounter (Signed)
Patient notified. Verbalized understanding -colonoscopy scheduled 10-05-23-maile new bowel prep instructions.   Please call and notify patient labs show:  All labs are normal.  Normal CBC, CMP, and TSH.  Hemoglobin is normal, 13.4, no anemia.  Kidney test, liver test, blood sugar, and thyroid test are all normal.  Continue with current plan for colonoscopy as scheduled.  [name], [name]

## 2023-10-05 ENCOUNTER — Ambulatory Visit: Payer: PPO | Admitting: General Practice

## 2023-10-05 ENCOUNTER — Other Ambulatory Visit: Payer: Self-pay

## 2023-10-05 ENCOUNTER — Encounter
Admission: RE | Disposition: A | Discharge status: Home / Self Care | Payer: Self-pay | Source: Home / Self Care | Attending: Gastroenterology

## 2023-10-05 ENCOUNTER — Ambulatory Visit
Admission: RE | Admit: 2023-10-05 | Discharge: 2023-10-05 | Disposition: A | Discharge status: Home / Self Care | Payer: PPO | Attending: Gastroenterology | Admitting: Gastroenterology

## 2023-10-05 DIAGNOSIS — D125 Benign neoplasm of sigmoid colon: Secondary | ICD-10-CM | POA: Diagnosis not present

## 2023-10-05 DIAGNOSIS — K625 Hemorrhage of anus and rectum: Secondary | ICD-10-CM | POA: Diagnosis not present

## 2023-10-05 DIAGNOSIS — K635 Polyp of colon: Secondary | ICD-10-CM

## 2023-10-05 DIAGNOSIS — J449 Chronic obstructive pulmonary disease, unspecified: Secondary | ICD-10-CM | POA: Diagnosis not present

## 2023-10-05 DIAGNOSIS — K921 Melena: Secondary | ICD-10-CM | POA: Insufficient documentation

## 2023-10-05 DIAGNOSIS — K640 First degree hemorrhoids: Secondary | ICD-10-CM | POA: Insufficient documentation

## 2023-10-05 DIAGNOSIS — D122 Benign neoplasm of ascending colon: Secondary | ICD-10-CM | POA: Diagnosis not present

## 2023-10-05 DIAGNOSIS — R634 Abnormal weight loss: Secondary | ICD-10-CM

## 2023-10-05 DIAGNOSIS — Z87891 Personal history of nicotine dependence: Secondary | ICD-10-CM | POA: Diagnosis not present

## 2023-10-05 HISTORY — PX: HOT HEMOSTASIS: SHX5433

## 2023-10-05 HISTORY — PX: SUBMUCOSAL TATTOO INJECTION: SHX6856

## 2023-10-05 HISTORY — PX: COLONOSCOPY WITH PROPOFOL: SHX5780

## 2023-10-05 HISTORY — PX: HEMOSTASIS CLIP PLACEMENT: SHX6857

## 2023-10-05 HISTORY — PX: POLYPECTOMY: SHX5525

## 2023-10-05 SURGERY — COLONOSCOPY WITH PROPOFOL
Anesthesia: General

## 2023-10-05 MED ORDER — EPHEDRINE SULFATE-NACL 50-0.9 MG/10ML-% IV SOSY
PREFILLED_SYRINGE | INTRAVENOUS | Status: DC | PRN
Start: 2023-10-05 — End: 2023-10-05
  Administered 2023-10-05: 10 mg via INTRAVENOUS

## 2023-10-05 MED ORDER — PHENYLEPHRINE 80 MCG/ML (10ML) SYRINGE FOR IV PUSH (FOR BLOOD PRESSURE SUPPORT)
PREFILLED_SYRINGE | INTRAVENOUS | Status: DC | PRN
Start: 2023-10-05 — End: 2023-10-05
  Administered 2023-10-05: 80 ug via INTRAVENOUS

## 2023-10-05 MED ORDER — SPOT INK MARKER SYRINGE KIT
PACK | SUBMUCOSAL | Status: DC | PRN
Start: 2023-10-05 — End: 2023-10-05
  Administered 2023-10-05: 1 mL via SUBMUCOSAL

## 2023-10-05 MED ORDER — LIDOCAINE HCL (CARDIAC) PF 100 MG/5ML IV SOSY
PREFILLED_SYRINGE | INTRAVENOUS | Status: DC | PRN
  Administered 2023-10-05: 60 mg via INTRAVENOUS

## 2023-10-05 MED ORDER — SODIUM CHLORIDE 0.9 % IV SOLN: INTRAVENOUS | Status: DC

## 2023-10-05 MED ORDER — PROPOFOL 500 MG/50ML IV EMUL
INTRAVENOUS | Status: DC | PRN
  Administered 2023-10-05: 70 ug/kg/min via INTRAVENOUS

## 2023-10-05 MED ORDER — PROPOFOL 10 MG/ML IV BOLUS
INTRAVENOUS | Status: DC | PRN
  Administered 2023-10-05: 60 mg via INTRAVENOUS

## 2023-10-05 NOTE — Anesthesia Postprocedure Evaluation (Signed)
Anesthesia Post Note  Patient: Sean Wood.  Procedure(s) Performed: COLONOSCOPY WITH PROPOFOL POLYPECTOMY HOT HEMOSTASIS (ARGON PLASMA COAGULATION/BICAP) HEMOSTASIS CLIP PLACEMENT SUBMUCOSAL TATTOO INJECTION  Patient location during evaluation: Endoscopy Anesthesia Type: General Level of consciousness: awake and alert Pain management: pain level controlled Vital Signs Assessment: post-procedure vital signs reviewed and stable Respiratory status: spontaneous breathing, nonlabored ventilation, respiratory function stable and patient connected to nasal cannula oxygen Cardiovascular status: blood pressure returned to baseline and stable Postop Assessment: no apparent nausea or vomiting Anesthetic complications: no  No notable events documented.   Last Vitals:  Vitals:   10/05/23 0824 10/05/23 0827  BP:  130/66  Pulse: 78 76  Resp: (!) 27 (!) 32  Temp:    SpO2: 98% 97%    Last Pain:  Vitals:   10/05/23 0827  TempSrc:   PainSc: 0-No pain                 Stephanie Coup

## 2023-10-05 NOTE — Transfer of Care (Signed)
Immediate Anesthesia Transfer of Care Note  Patient: Sean Wood.  Procedure(s) Performed: COLONOSCOPY WITH PROPOFOL POLYPECTOMY HOT HEMOSTASIS (ARGON PLASMA COAGULATION/BICAP) HEMOSTASIS CLIP PLACEMENT SUBMUCOSAL TATTOO INJECTION  Patient Location: PACU  Anesthesia Type:General  Level of Consciousness: awake, alert , and oriented  Airway & Oxygen Therapy: Patient Spontanous Breathing  Post-op Assessment: Report given to RN and Post -op Vital signs reviewed and stable  Post vital signs: Reviewed and stable  Last Vitals:  Vitals Value Taken Time  BP 119/67 0820  Temp    Pulse 79   Resp 25   SpO2 96     Last Pain:  Vitals:   10/05/23 0719  TempSrc: Temporal  PainSc: 0-No pain         Complications: No notable events documented.

## 2023-10-05 NOTE — Anesthesia Preprocedure Evaluation (Signed)
Anesthesia Evaluation  Patient identified by MRN, date of birth, ID band Patient awake    Reviewed: Allergy & Precautions, H&P , NPO status , Patient's Chart, lab work & pertinent test results, reviewed documented beta blocker date and time   Airway Mallampati: III  TM Distance: >3 FB Neck ROM: full    Dental no notable dental hx. (+) Chipped, Dental Advidsory Given   Pulmonary neg shortness of breath, COPD,  COPD inhaler, former smoker   Pulmonary exam normal breath sounds clear to auscultation       Cardiovascular Exercise Tolerance: Good negative cardio ROS Normal cardiovascular exam Rhythm:regular Rate:Normal     Neuro/Psych negative neurological ROS  negative psych ROS   GI/Hepatic negative GI ROS, Neg liver ROS,,,  Endo/Other  negative endocrine ROS    Renal/GU Renal disease  negative genitourinary   Musculoskeletal   Abdominal   Peds  Hematology negative hematology ROS (+)   Anesthesia Other Findings Past Medical History: No date: Arthritis No date: COPD (chronic obstructive pulmonary disease) (HCC) No date: Elevated PSA No date: Hematuria, gross No date: History of hiatal hernia No date: HOH (hard of hearing) No date: Kidney stone No date: Prostate cancer (HCC) No date: Shortness of breath dyspnea  Past Surgical History: 69, 1987: BACK SURGERY 06/24/2015: CYSTOSCOPY     Comment:  Procedure: CYSTOSCOPY;  Surgeon: Vanna Scotland, MD;                Location: [facility] ORS;  Service: Urology;; No date: HERNIA REPAIR     Comment:  left and right inguinal hernia repair 06/24/2015: RADIOACTIVE SEED IMPLANT; N/A     Comment:  Procedure: RADIOACTIVE SEED IMPLANT/BRACHYTHERAPY               IMPLANT;  Surgeon: Vanna Scotland, MD;  Location: [facility]               ORS;  Service: Urology;  Laterality: N/A;  BMI    Body Mass Index: 20.82 kg/m      Reproductive/Obstetrics negative OB ROS                              Anesthesia Physical Anesthesia Plan  ASA: 3  Anesthesia Plan: General   Post-op Pain Management: Minimal or no pain anticipated   Induction: Intravenous  PONV Risk Score and Plan: 3 and Propofol infusion, TIVA and Ondansetron  Airway Management Planned: Nasal Cannula  Additional Equipment: None  Intra-op Plan:   Post-operative Plan:   Informed Consent: I have reviewed the patients History and Physical, chart, labs and discussed the procedure including the risks, benefits and alternatives for the proposed anesthesia with the patient or authorized representative who has indicated his/her understanding and acceptance.     Dental advisory given  Plan Discussed with: CRNA and Surgeon  Anesthesia Plan Comments: (Discussed risks of anesthesia with patient, including possibility of difficulty with spontaneous ventilation under anesthesia necessitating airway intervention, PONV, and rare risks such as cardiac or respiratory or neurological events, and allergic reactions. Discussed the role of CRNA in patient's perioperative care. Patient understands.)       Anesthesia Quick Evaluation

## 2023-10-05 NOTE — H&P (Signed)
Midge Minium, MD [facility] [address], [address] [place], [place] [address] [phone] Fax : [phone]  Primary Care Physician:  Sherlyn Hay, DO Primary Gastroenterologist:  Dr. Servando Snare  Pre-Procedure History & Physical: HPI:  Sean Wood. is a 87 y.o. male is here for an endoscopy and colonoscopy.   Past Medical History:  Diagnosis Date   Arthritis    COPD (chronic obstructive pulmonary disease) (HCC)    Elevated PSA    Hematuria, gross    History of hiatal hernia    HOH (hard of hearing)    Kidney stone    Prostate cancer (HCC)    Shortness of breath dyspnea     Past Surgical History:  Procedure Laterality Date   BACK SURGERY  1973, 1987   CYSTOSCOPY  06/24/2015   Procedure: CYSTOSCOPY;  Surgeon: Vanna Scotland, MD;  Location: [facility] ORS;  Service: Urology;;   HERNIA REPAIR     left and right inguinal hernia repair   RADIOACTIVE SEED IMPLANT N/A 06/24/2015   Procedure: RADIOACTIVE SEED IMPLANT/BRACHYTHERAPY IMPLANT;  Surgeon: Vanna Scotland, MD;  Location: [facility] ORS;  Service: Urology;  Laterality: N/A;    Prior to Admission medications   Medication Sig Start Date End Date Taking? Authorizing Provider  apalutamide (ERLEADA) 60 MG tablet Take 1 tablet (60 mg total) by mouth daily. 07/14/23  Yes Rickard Patience, MD  albuterol (VENTOLIN HFA) 108 (90 Base) MCG/ACT inhaler Inhale 2 puffs into the lungs every 6 (six) hours as needed for wheezing or shortness of breath. 07/14/23   Rickard Patience, MD  B Complex Vitamins (B COMPLEX VITAMIN PO) Take by mouth. 02/03/07   [provider]  Blood Glucose Monitoring Suppl DEVI 1 each by Does not apply route in the morning, at noon, and at bedtime. May substitute to any manufacturer covered by patient's insurance. 09/05/23   Sherlyn Hay, DO  calcium-vitamin D (OSCAL WITH D) 500-5 MG-MCG tablet Take 2 tablets by mouth daily. 06/10/22   Rickard Patience, MD  cholecalciferol (VITAMIN D3) 25 MCG (1000 UNIT) tablet Take 1,000 Units by mouth  daily.    [provider]  clobetasol (TEMOVATE) 0.05 % external solution Apply 1 Application topically 2 (two) times daily. 07/19/23   Sherlyn Hay, DO  clobetasol cream (TEMOVATE) 0.05 % Apply 1 Application topically 2 (two) times daily. 09/20/23   Sherlyn Hay, DO  co-enzyme Q-10 30 MG capsule COENZYME Q10 (NON-VA MED) CAP/TAB                                      Non-VA                                                                        Feb 03, 2007                                        Non-VA  Documented by:  Hollie Beach                                    Documented at:  [facility] 02/03/07   [provider]  cyanocobalamin (VITAMIN B12) 1000 MCG tablet Take 1,000 mcg by mouth daily.    [provider]  fluticasone-salmeterol (ADVAIR DISKUS) 250-50 MCG/ACT AEPB Inhale 1 puff into the lungs in the morning and at bedtime. 07/14/23   Rickard Patience, MD  ibuprofen (ADVIL) 100 MG tablet Take by mouth. 12/10/05   [provider]  Lancet Device MISC 1 each by Does not apply route in the morning, at noon, and at bedtime. May substitute to any manufacturer covered by patient's insurance. 09/05/23 10/05/23  Sherlyn Hay, DO  Lancets (ONETOUCH ULTRASOFT) lancets Use to check sugar daily for type 2 diabetes E11.9 08/29/23   Sherlyn Hay, DO  Lancets Misc. MISC 1 each by Does not apply route in the morning, at noon, and at bedtime. May substitute to any manufacturer covered by patient's insurance. 09/05/23 10/05/23  Sherlyn Hay, DO    Allergies as of 09/23/2023 - Review Complete 09/22/2023  Allergen Reaction Noted   Mevacor [lovastatin] Other (See Comments) 05/16/2015   Niacin and related Other (See Comments) 04/24/2015   Statins Other (See Comments) 04/24/2015   Aleve [naproxen sodium] Rash 04/24/2015    Family History  Problem  Relation Age of Onset   Cancer Brother    Heart disease Brother    Diabetes Brother    Emphysema Brother    Prostate cancer Brother    Diabetes Sister    Emphysema Mother    Healthy Sister    Healthy Sister    Healthy Sister    Diabetes Brother    Diabetes Brother    Diabetes Brother    Healthy Brother     Social History   Socioeconomic History   Marital status: Widowed    Spouse name: Not on file   Number of children: 1   Years of education: Not on file   Highest education level: 8th grade  Occupational History   Occupation: retired  Tobacco Use   Smoking status: Former    Current packs/day: 0.00    Types: Cigarettes    Quit date: 05/24/1981    Years since quitting: 42.3   Smokeless tobacco: Current    Types: Chew  Vaping Use   Vaping status: Never Used  Substance and Sexual Activity   Alcohol use: Not Currently    Alcohol/week: 0.0 standard drinks of alcohol   Drug use: No   Sexual activity: Not on file  Other Topics Concern   Not on file  Social History Narrative   Not on file   Social Determinants of Health   Financial Resource Strain: Low Risk  (05/31/2023)   Overall Financial Resource Strain (CARDIA)    Difficulty of Paying Living Expenses: Not hard at all  Food Insecurity: No Food Insecurity (05/31/2023)   Hunger Vital Sign    Worried About Running Out of Food in the Last Year: Never true    Ran Out of Food in the Last Year: Never true  Transportation Needs: No Transportation Needs (05/31/2023)   PRAPARE - Administrator, Civil Service (Medical): No    Lack of Transportation (Non-Medical): No  Physical Activity: Inactive (05/31/2023)   Exercise Vital Sign    Days  of Exercise per Week: 0 days    Minutes of Exercise per Session: 0 min  Stress: No Stress Concern Present (05/31/2023)   [employer] of Occupational Health - Occupational Stress Questionnaire    Feeling of Stress : Not at all  Social Connections: Socially Isolated  (05/31/2023)   Social Connection and Isolation Panel [NHANES]    Frequency of Communication with Friends and Family: Twice a week    Frequency of Social Gatherings with Friends and Family: Once a week    Attends Religious Services: Never    Database administrator or Organizations: No    Attends Banker Meetings: Never    Marital Status: Widowed  Intimate Partner Violence: Not At Risk (05/31/2023)   Humiliation, Afraid, Rape, and Kick questionnaire    Fear of Current or Ex-Partner: No    Emotionally Abused: No    Physically Abused: No    Sexually Abused: No    Review of Systems: See HPI, otherwise negative ROS  Physical Exam: BP (!) 146/72   Pulse 73   Temp (!) 97 F (36.1 C) (Temporal)   Resp 18   Ht 5\' 9"  (1.753 m)   Wt 64 kg   SpO2 98%   BMI 20.82 kg/m  General:   Alert,  pleasant and cooperative in NAD Head:  Normocephalic and atraumatic. Neck:  Supple; no masses or thyromegaly. Lungs:  Clear throughout to auscultation.    Heart:  Regular rate and rhythm. Abdomen:  Soft, nontender and nondistended. Normal bowel sounds, without guarding, and without rebound.   Neurologic:  Alert and  oriented x4;  grossly normal neurologically.  Impression/Plan: Sean Hams. is here for an endoscopy and colonoscopy to be performed for rectal bleeding and weight loss  Risks, benefits, limitations, and alternatives regarding  endoscopy and colonoscopy have been reviewed with the patient.  Questions have been answered.  All parties agreeable.   Midge Minium, MD  10/05/2023, 7:42 AM

## 2023-10-05 NOTE — Op Note (Signed)
[facility] Gastroenterology Patient Name: Sean Wood Procedure Date: 10/05/2023 7:12 AM MRN: [id number] Account #: [id number] Date of Birth: May 08, 1936 Admit Type: Outpatient Age: 87 Room: [facility] ENDO ROOM 4 Gender: Male Note Status: Finalized Instrument Name: Prentice Docker [id number] Procedure:             Colonoscopy Indications:           Hematochezia Providers:             Midge Minium MD, MD Referring MD:          No Local Md, MD (Referring MD) Medicines:             Propofol per Anesthesia Complications:         No immediate complications. Procedure:             Pre-Anesthesia Assessment:                        - Prior to the procedure, a History and Physical was                         performed, and patient medications and allergies were                         reviewed. The patient's tolerance of previous                         anesthesia was also reviewed. The risks and benefits                         of the procedure and the sedation options and risks                         were discussed with the patient. All questions were                         answered, and informed consent was obtained. Prior                         Anticoagulants: The patient has taken no anticoagulant                         or antiplatelet agents. ASA Grade Assessment: II - A                         patient with mild systemic disease. After reviewing                         the risks and benefits, the patient was deemed in                         satisfactory condition to undergo the procedure.                        After obtaining informed consent, the colonoscope was                         passed under direct vision. Throughout the procedure,  the patient's blood pressure, pulse, and oxygen                         saturations were monitored continuously. The                         Colonoscope was introduced through the anus and                          advanced to the the cecum, identified by appendiceal                         orifice and ileocecal valve. The colonoscopy was                         performed without difficulty. The patient tolerated                         the procedure well. The quality of the bowel                         preparation was excellent. Findings:      The perianal and digital rectal examinations were normal.      A 15 mm polyp was found in the sigmoid colon. The polyp was       pedunculated. The polyp was removed with a hot snare. Resection and       retrieval were complete. To prevent bleeding post-intervention, one       hemostatic clip was successfully placed (MR conditional). Clip       manufacturer: [employer]. There was no bleeding at the end of the       procedure. Area was successfully injected with 2 mL Spot (carbon black)       for tattooing.      Four sessile polyps were found in the ascending colon. The polyps were 3       to 10 mm in size. These polyps were removed with a cold snare. Resection       and retrieval were complete.      Non-bleeding internal hemorrhoids were found during retroflexion. The       hemorrhoids were Grade I (internal hemorrhoids that do not prolapse). Impression:            - One 15 mm polyp in the sigmoid colon, removed with a                         hot snare. Resected and retrieved. Clip (MR                         conditional) was placed. Clip manufacturer: [employer]. Injected.                        - Four 3 to 10 mm polyps in the ascending colon,                         removed with a cold snare. Resected and retrieved.                        -  Non-bleeding internal hemorrhoids. Recommendation:        - Discharge patient to home.                        - Resume previous diet.                        - Continue present medications.                        - Await pathology results. Procedure Code(s):     --- Professional  ---                        [phone], Colonoscopy, flexible; with removal of                         tumor(s), polyp(s), or other lesion(s) by snare                         technique                        [id number], Colonoscopy, flexible; with directed submucosal                         injection(s), any substance Diagnosis Code(s):     --- Professional ---                        K92.1, Melena (includes Hematochezia)                        D12.2, Benign neoplasm of ascending colon                        D12.5, Benign neoplasm of sigmoid colon CPT copyright 2022 American Medical Association. All rights reserved. The codes documented in this report are preliminary and upon coder review may  be revised to meet current compliance requirements. Midge Minium MD, MD 10/05/2023 8:14:50 AM This report has been signed electronically. Number of Addenda: 0 Note Initiated On: 10/05/2023 7:12 AM Scope Withdrawal Time: 0 hours 13 minutes 41 seconds  Total Procedure Duration: 0 hours 16 minutes 14 seconds  Estimated Blood Loss:  Estimated blood loss: none.      [facility]

## 2023-10-06 ENCOUNTER — Encounter: Payer: Self-pay | Admitting: Gastroenterology

## 2023-10-06 ENCOUNTER — Other Ambulatory Visit: Payer: Self-pay

## 2023-10-06 LAB — SURGICAL PATHOLOGY

## 2023-10-10 ENCOUNTER — Encounter: Payer: Self-pay | Admitting: Family Medicine

## 2023-10-10 ENCOUNTER — Encounter: Payer: Self-pay | Admitting: Oncology

## 2023-10-10 ENCOUNTER — Telehealth: Payer: Self-pay

## 2023-10-10 ENCOUNTER — Other Ambulatory Visit (HOSPITAL_COMMUNITY): Payer: Self-pay

## 2023-10-10 NOTE — Telephone Encounter (Signed)
Oral Oncology Patient Advocate Encounter  Was successful in securing patient a $8,000.00 grant from [employer] to provide copayment coverage for [place].  This will keep the out of pocket expense at $0.     [employer] ID: [id number]   The billing information is as follows and has been shared with [facility].    RxBin: [id number] PCN: [id number] Member ID: [id number] Group ID: [id number] Dates of Eligibility: 10/08/23 through 10/06/24  Fund:  Prostate Cancer - Medicare Access   Ardeen Fillers, CPhT Oncology Pharmacy Patient Advocate  [facility]  [phone] (phone) [phone] (fax) 10/10/2023 12:13 PM

## 2023-10-10 NOTE — Assessment & Plan Note (Signed)
Prescribed clobetasol cream for patient to psoriatic lesions on arms and legs as needed.

## 2023-10-12 NOTE — Plan of Care (Signed)
[facility] Tonsillectomy/Adenoidectomy, Postoperative PEDS care plan entered in error.

## 2023-10-20 ENCOUNTER — Inpatient Hospital Stay: Payer: PPO

## 2023-10-20 ENCOUNTER — Encounter: Payer: Self-pay | Admitting: Oncology

## 2023-10-20 ENCOUNTER — Inpatient Hospital Stay: Payer: PPO | Attending: Oncology

## 2023-10-20 ENCOUNTER — Other Ambulatory Visit: Payer: Self-pay

## 2023-10-20 ENCOUNTER — Inpatient Hospital Stay: Payer: PPO | Admitting: Oncology

## 2023-10-20 VITALS — BP 134/78 | HR 71 | Temp 96.0°F | Resp 18 | Wt 141.0 lb

## 2023-10-20 DIAGNOSIS — G4709 Other insomnia: Secondary | ICD-10-CM

## 2023-10-20 DIAGNOSIS — Z5111 Encounter for antineoplastic chemotherapy: Secondary | ICD-10-CM | POA: Diagnosis not present

## 2023-10-20 DIAGNOSIS — J438 Other emphysema: Secondary | ICD-10-CM | POA: Diagnosis not present

## 2023-10-20 DIAGNOSIS — R5383 Other fatigue: Secondary | ICD-10-CM

## 2023-10-20 DIAGNOSIS — Z79899 Other long term (current) drug therapy: Secondary | ICD-10-CM | POA: Insufficient documentation

## 2023-10-20 DIAGNOSIS — C61 Malignant neoplasm of prostate: Secondary | ICD-10-CM | POA: Insufficient documentation

## 2023-10-20 LAB — CMP (CANCER CENTER ONLY)
ALT: 8 U/L (ref 0–44)
AST: 15 U/L (ref 15–41)
Albumin: 4.2 g/dL (ref 3.5–5.0)
Alkaline Phosphatase: 70 U/L (ref 38–126)
Anion gap: 7 (ref 5–15)
BUN: 23 mg/dL (ref 8–23)
CO2: 31 mmol/L (ref 22–32)
Calcium: 8.8 mg/dL — ABNORMAL LOW (ref 8.9–10.3)
Chloride: 103 mmol/L (ref 98–111)
Creatinine: 0.89 mg/dL (ref 0.61–1.24)
GFR, Estimated: >60 mL/min (ref >60)
Glucose, Bld: 102 mg/dL — ABNORMAL HIGH (ref 70–99)
Potassium: 4.6 mmol/L (ref 3.5–5.1)
Sodium: 141 mmol/L (ref 135–145)
Total Bilirubin: 0.2 mg/dL — ABNORMAL LOW (ref 0.3–1.2)
Total Protein: 6.8 g/dL (ref 6.5–8.1)

## 2023-10-20 LAB — CBC WITH DIFFERENTIAL (CANCER CENTER ONLY)
Abs Immature Granulocytes: 0.01 10*3/uL (ref 0.00–0.07)
Basophils Absolute: 0 10*3/uL (ref 0.0–0.1)
Basophils Relative: 0 %
Eosinophils Absolute: 0 10*3/uL (ref 0.0–0.5)
Eosinophils Relative: 0 %
HCT: 42.6 % (ref 39.0–52.0)
Hemoglobin: 13.2 g/dL (ref 13.0–17.0)
Immature Granulocytes: 0 %
Lymphocytes Relative: 43 %
Lymphs Abs: 2.3 10*3/uL (ref 0.7–4.0)
MCH: 27.7 pg (ref 26.0–34.0)
MCHC: 31 g/dL (ref 30.0–36.0)
MCV: 89.5 fL (ref 80.0–100.0)
Monocytes Absolute: 1 10*3/uL (ref 0.1–1.0)
Monocytes Relative: 19 %
Neutro Abs: 2 10*3/uL (ref 1.7–7.7)
Neutrophils Relative %: 38 %
Platelet Count: 196 10*3/uL (ref 150–400)
RBC: 4.76 MIL/uL (ref 4.22–5.81)
RDW: 13.2 % (ref 11.5–15.5)
WBC Count: 5.3 10*3/uL (ref 4.0–10.5)
nRBC: 0 % (ref 0.0–0.2)

## 2023-10-20 LAB — PSA: Prostatic Specific Antigen: 0.42 ng/mL (ref 0.00–4.00)

## 2023-10-20 MED ORDER — ALBUTEROL SULFATE HFA 108 (90 BASE) MCG/ACT IN AERS: 2.0000 | INHALATION_SPRAY | Freq: Four times a day (QID) | RESPIRATORY_TRACT | 2 refills | Status: DC | PRN

## 2023-10-20 MED ORDER — FLUTICASONE-SALMETEROL 250-50 MCG/ACT IN AEPB: 1.0000 | INHALATION_SPRAY | Freq: Two times a day (BID) | RESPIRATORY_TRACT | 2 refills | Status: DC

## 2023-10-20 MED ORDER — APALUTAMIDE 60 MG PO TABS
60.0000 mg | ORAL_TABLET | Freq: Every day | ORAL | 3 refills | Status: DC
  Filled 2023-10-20 – 2023-10-25 (×2): qty 30, 30d supply, fill #0
  Filled 2023-11-24: qty 30, 30d supply, fill #1

## 2023-10-20 MED ORDER — LEUPROLIDE ACETATE (6 MONTH) 45 MG ~~LOC~~ KIT
45.0000 mg | PACK | Freq: Once | SUBCUTANEOUS | Status: AC
  Administered 2023-10-20: 45 mg via SUBCUTANEOUS
  Filled 2023-10-20: qty 45

## 2023-10-20 NOTE — Assessment & Plan Note (Signed)
trazodone 50 mg nightly as needed. Marland Kitchen

## 2023-10-20 NOTE — Assessment & Plan Note (Signed)
COPD/Emphysema, recommend smoke cessation.  Patient declines pulmonology follow-ups.  He does not actively follow-up with PCP Continue albuterol as needed for wheezing/shortness of breath, Advair  twice daily-patient admits not being very compliant. I encourage patient to increase compliance and make a follow-up with appointment with pulmonology.

## 2023-10-20 NOTE — Assessment & Plan Note (Signed)
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  Symptom has slightly improved.

## 2023-10-20 NOTE — Progress Notes (Signed)
Hematology/Oncology Progress note [phone] [phone]     Chief Complaint Castration resistant  prostate cancer   ASSESSMENT & PLAN:   Cancer Staging  No matching staging information was found for the patient.   Malignant neoplasm of prostate [facility]) Prostate Cancer with biochemical recurrence, asymptomatic, castration resistant nonmetastatic.  Previous PSMA PET scan showed elevated SUV in the prostate/prostate bed, consider future reestablishment of Radonc for evaluation of additional radiation. Continue ADT plan with Eligard 45mg  Q6 months -proceed today, next due April 2025 Labs reviewed and discussed with patient. Patient can only tolerate apalutamide 60mg  daily.  PSA is stable, today's level is pending  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HCC) COPD/Emphysema, recommend smoke cessation.  Patient declines pulmonology follow-ups.  He does not actively follow-up with PCP Continue albuterol as needed for wheezing/shortness of breath, Advair  twice daily-patient admits not being very compliant. I encourage patient to increase compliance and make a follow-up with appointment with pulmonology.  Other fatigue Multifactorial, medication side effects, hypoxia secondary to COPD/emphysema, prolonged grieving/depression Recommend empiric B12 and Vitamin D supplementation.  Symptom has slightly improved.  Insomnia  trazodone 50 mg nightly as needed. .  Orders Placed This Encounter  Procedures   CMP (Cancer Center only)    Standing Status:   Future    Standing Expiration Date:   10/19/2024   CBC with Differential (Cancer Center Only)    Standing Status:   Future    Standing Expiration Date:   10/19/2024   PSA    Standing Status:   Future    Standing Expiration Date:   10/19/2024    Follow up  3 months  All questions were answered. The patient knows to call the clinic with any problems, questions or concerns.  Sean Patience, MD, PhD [facility]  Hematology Oncology 10/20/2023    HISTORY OF PRESENTING ILLNESS:  Sean Catching. is a  87 y.o.  male with PMH listed below who was referred to me for evaluation of prostate cancer.   Cancer history dated back to 2009 when He was diagnosed with intermediate risk, Gleason 3+4, T1c prostate cancer in 2009. PSA at diagnosis was 6.1. IMRT was completed in August 2009. PSA nadir was 0.5. His PSA January 2013 was 0.8 and in January 2014 was 1.3. This was repeated in May 2014 and had increased to 1.8. He elected a repeat prostate biopsy in November 2014 after his PSA in October had increased to 2.2. This showed a focus of Gleason 3+3 adenocarcinoma from the right prostate involving less than 5%. He elected surveillance after discussing options. In May 2016, he was evaluated by Dr.Chrystal and he received salvage seed implantation with I-125. He subsequently follows with Dr.Chrystal for Lupron every 4 months since then. Achieve PSA nadir of 0.06 in 09/2016, and started to trend up and most recently trended up to 9.64.   # Lupron 30 mg on 05/10/2018, 06/14/2018 testosterone level was at 6  # Leupron 22.5mg  11/06/2018.  Delayed as patient reports feeling very fatigued and we discussed about holding Lupron.  88-month and restarted on 11/06/2018.  10/06/2021, PSMA PET scan showed marked radiotracer accumulation with elevated SUV in the prostate/prostate bed tracking towards the bladder base with distortion of the urinary bladder.  Signs of brachytherapy in the prostate as well.  No definitive signs of extraprostatic disease.  A single lymph node in the left the pelvis with only minimally elevated SUV.  Equivocal.  Aortic atherosclerosis.  10/20/2021 Darolutamide 600mg  BID 01/01/2022, decreased  to Darolutamide 300mg  BID 06/10/2022 Darolutamide 300mg  BID held 07/27/2022, started on apalutamide 240 mg daily. INTERVAL HISTORY Sean Magnus. is a 87 y.o. male who has above history reviewed by me present for  follow-up of management of biochemical recurrence of prostate cancer  Patient was here by himeself. He is only taking 60mg  of Apalutamide. Tolerates well. Chronic SOB  Chronic fatigue, slightly better      Review of Systems  Constitutional:  Positive for malaise/fatigue. Negative for chills, fever and weight loss.  HENT:  Negative for sore throat.   Eyes:  Negative for redness.  Respiratory:  Positive for shortness of breath. Negative for cough and wheezing.   Cardiovascular:  Negative for chest pain, palpitations and leg swelling.  Gastrointestinal:  Negative for abdominal pain, blood in stool, nausea and vomiting.  Genitourinary:  Negative for dysuria.  Musculoskeletal:  Negative for myalgias.  Skin:  Negative for rash.  Neurological:  Negative for dizziness, tingling and tremors.  Endo/Heme/Allergies:  Does not bruise/bleed easily.  Psychiatric/Behavioral:  Negative for hallucinations.     MEDICAL HISTORY:  Past Medical History:  Diagnosis Date   Arthritis    COPD (chronic obstructive pulmonary disease) (HCC)    Elevated PSA    Hematuria, gross    History of hiatal hernia    HOH (hard of hearing)    Kidney stone    Prostate cancer (HCC)    Shortness of breath dyspnea     SURGICAL HISTORY: Past Surgical History:  Procedure Laterality Date   BACK SURGERY  1973, 1987   COLONOSCOPY WITH PROPOFOL N/A 10/05/2023   Procedure: COLONOSCOPY WITH PROPOFOL;  Surgeon: Midge Minium, MD;  Location: [facility] ENDOSCOPY;  Service: Endoscopy;  Laterality: N/A;   CYSTOSCOPY  06/24/2015   Procedure: CYSTOSCOPY;  Surgeon: Vanna Scotland, MD;  Location: [facility] ORS;  Service: Urology;;   HEMOSTASIS CLIP PLACEMENT  10/05/2023   Procedure: HEMOSTASIS CLIP PLACEMENT;  Surgeon: Midge Minium, MD;  Location: [facility] ENDOSCOPY;  Service: Endoscopy;;   HERNIA REPAIR     left and right inguinal hernia repair   HOT HEMOSTASIS  10/05/2023   Procedure: HOT HEMOSTASIS (ARGON PLASMA COAGULATION/BICAP);  Surgeon:  Midge Minium, MD;  Location: [facility] ENDOSCOPY;  Service: Endoscopy;;   POLYPECTOMY  10/05/2023   Procedure: POLYPECTOMY;  Surgeon: Midge Minium, MD;  Location: [facility] ENDOSCOPY;  Service: Endoscopy;;   RADIOACTIVE SEED IMPLANT N/A 06/24/2015   Procedure: RADIOACTIVE SEED IMPLANT/BRACHYTHERAPY IMPLANT;  Surgeon: Vanna Scotland, MD;  Location: [facility] ORS;  Service: Urology;  Laterality: N/A;   SUBMUCOSAL TATTOO INJECTION  10/05/2023   Procedure: SUBMUCOSAL TATTOO INJECTION;  Surgeon: Midge Minium, MD;  Location: [facility] ENDOSCOPY;  Service: Endoscopy;;    SOCIAL HISTORY: Social History   Socioeconomic History   Marital status: Widowed    Spouse name: Not on file   Number of children: 1   Years of education: Not on file   Highest education level: 8th grade  Occupational History   Occupation: retired  Tobacco Use   Smoking status: Former    Current packs/day: 0.00    Types: Cigarettes    Quit date: 05/24/1981    Years since quitting: 42.4   Smokeless tobacco: Current    Types: Chew  Vaping Use   Vaping status: Never Used  Substance and Sexual Activity   Alcohol use: Not Currently    Alcohol/week: 0.0 standard drinks of alcohol   Drug use: No   Sexual activity: Not on file  Other Topics Concern   Not  on file  Social History Narrative   Not on file   Social Determinants of Health   Financial Resource Strain: Low Risk  (05/31/2023)   Overall Financial Resource Strain (CARDIA)    Difficulty of Paying Living Expenses: Not hard at all  Food Insecurity: No Food Insecurity (05/31/2023)   Hunger Vital Sign    Worried About Running Out of Food in the Last Year: Never true    Ran Out of Food in the Last Year: Never true  Transportation Needs: No Transportation Needs (05/31/2023)   PRAPARE - Administrator, Civil Service (Medical): No    Lack of Transportation (Non-Medical): No  Physical Activity: Inactive (05/31/2023)   Exercise Vital Sign    Days of Exercise per Week: 0 days     Minutes of Exercise per Session: 0 min  Stress: No Stress Concern Present (05/31/2023)   [employer] of Occupational Health - Occupational Stress Questionnaire    Feeling of Stress : Not at all  Social Connections: Socially Isolated (05/31/2023)   Social Connection and Isolation Panel [NHANES]    Frequency of Communication with Friends and Family: Twice a week    Frequency of Social Gatherings with Friends and Family: Once a week    Attends Religious Services: Never    Database administrator or Organizations: No    Attends Banker Meetings: Never    Marital Status: Widowed  Intimate Partner Violence: Not At Risk (05/31/2023)   Humiliation, Afraid, Rape, and Kick questionnaire    Fear of Current or Ex-Partner: No    Emotionally Abused: No    Physically Abused: No    Sexually Abused: No    FAMILY HISTORY: Family History  Problem Relation Age of Onset   Cancer Brother    Heart disease Brother    Diabetes Brother    Emphysema Brother    Prostate cancer Brother    Diabetes Sister    Emphysema Mother    Healthy Sister    Healthy Sister    Healthy Sister    Diabetes Brother    Diabetes Brother    Diabetes Brother    Healthy Brother     ALLERGIES:  is allergic to [employer] [lovastatin], niacin and related, statins, and aleve [naproxen sodium].  MEDICATIONS:  Current Outpatient Medications  Medication Sig Dispense Refill   B Complex Vitamins (B COMPLEX VITAMIN PO) Take by mouth.     Blood Glucose Monitoring Suppl DEVI 1 each by Does not apply route in the morning, at noon, and at bedtime. May substitute to any manufacturer covered by patient's insurance. 1 each 0   calcium-vitamin D (OSCAL WITH D) 500-5 MG-MCG tablet Take 2 tablets by mouth daily. 90 tablet 1   cholecalciferol (VITAMIN D3) 25 MCG (1000 UNIT) tablet Take 1,000 Units by mouth daily.     clobetasol (TEMOVATE) 0.05 % external solution Apply 1 Application topically 2 (two) times daily. 50 mL 0    clobetasol cream (TEMOVATE) 0.05 % Apply 1 Application topically 2 (two) times daily. 30 g 0   co-enzyme Q-10 30 MG capsule COENZYME Q10 (NON-VA MED) CAP/TAB                                      Non-VA  Feb 03, 2007                                        Non-VA                                                                                                          Documented by:  Hollie Beach                                    Documented at:  [facility]     cyanocobalamin (VITAMIN B12) 1000 MCG tablet Take 1,000 mcg by mouth daily.     ibuprofen (ADVIL) 100 MG tablet Take by mouth.     Lancets (ONETOUCH ULTRASOFT) lancets Use to check sugar daily for type 2 diabetes E11.9 100 each 0   albuterol (VENTOLIN HFA) 108 (90 Base) MCG/ACT inhaler Inhale 2 puffs into the lungs every 6 (six) hours as needed for wheezing or shortness of breath. 8 g 2   apalutamide (ERLEADA) 60 MG tablet Take 1 tablet (60 mg total) by mouth daily. 30 tablet 3   fluticasone-salmeterol (ADVAIR DISKUS) 250-50 MCG/ACT AEPB Inhale 1 puff into the lungs in the morning and at bedtime. 60 each 2   No current facility-administered medications for this visit.     PHYSICAL EXAMINATION: ECOG PERFORMANCE STATUS: 1 - Symptomatic but completely ambulatory Vitals:   10/20/23 1346  BP: 134/78  Pulse: 71  Resp: 18  Temp: (!) 96 F (35.6 C)  SpO2: 95%   Filed Weights   10/20/23 1346  Weight: 141 lb (64 kg)     Physical Exam Constitutional:      General: He is not in acute distress.    Comments: Patient walks independently.  HENT:     Head: Normocephalic.  Eyes:     General: No scleral icterus. Cardiovascular:     Rate and Rhythm: Normal rate and regular rhythm.     Heart sounds: Normal heart sounds.  Pulmonary:     Effort: Pulmonary effort is normal. No respiratory distress.     Breath sounds: No wheezing.     Comments:  Decreased breath sound bilaterally Abdominal:     General: Bowel sounds are normal. There is no distension.     Palpations: Abdomen is soft.  Musculoskeletal:        General: No deformity. Normal range of motion.     Cervical back: Normal range of motion and neck supple.  Lymphadenopathy:     Cervical: No cervical adenopathy.  Skin:    General: Skin is warm and dry.     Findings: No erythema or rash.  Neurological:     Mental Status: He is alert and oriented to person, place, and time. Mental status is at baseline.     Cranial Nerves: No cranial nerve deficit.  Psychiatric:        Mood  and Affect: Mood normal.      LABORATORY DATA:  I have reviewed the data as listed     Latest Ref Rng & Units 10/20/2023    1:20 PM 09/22/2023    3:08 PM 08/29/2023    3:40 PM  CBC  WBC 4.0 - 10.5 K/uL 5.3  4.7  5.6   Hemoglobin 13.0 - 17.0 g/dL 40.9  81.1  91.4   Hematocrit 39.0 - 52.0 % 42.6  41.0  42.8   Platelets 150 - 400 K/uL 196  254  153       Latest Ref Rng & Units 10/20/2023    1:19 PM 09/22/2023    3:08 PM 08/29/2023    3:40 PM  CMP  Glucose 70 - 99 mg/dL 782  92  93   BUN 8 - 23 mg/dL 23  15  19    Creatinine 0.61 - 1.24 mg/dL 9.56  2.13  0.86   Sodium 135 - 145 mmol/L 141  145  145   Potassium 3.5 - 5.1 mmol/L 4.6  4.6  4.5   Chloride 98 - 111 mmol/L 103  102  103   CO2 22 - 32 mmol/L 31  29  29    Calcium 8.9 - 10.3 mg/dL 8.8  9.2  9.2   Total Protein 6.5 - 8.1 g/dL 6.8  6.1    Total Bilirubin 0.3 - 1.2 mg/dL 0.2  <5.7    Alkaline Phos 38 - 126 U/L 70  84    AST 15 - 41 U/L 15  14    ALT 0 - 44 U/L 8  5       RADIOGRAPHIC STUDIES: I have personally reviewed the radiological images as listed and agreed with the findings in the report. No results found.

## 2023-10-20 NOTE — Assessment & Plan Note (Addendum)
Prostate Cancer with biochemical recurrence, asymptomatic, castration resistant nonmetastatic.  Previous PSMA PET scan showed elevated SUV in the prostate/prostate bed, consider future reestablishment of Radonc for evaluation of additional radiation. Continue ADT plan with Eligard 45mg  Q6 months -proceed today, next due April 2025 Labs reviewed and discussed with patient. Patient can only tolerate apalutamide 60mg  daily.  PSA is stable, today's level is pending

## 2023-10-24 ENCOUNTER — Ambulatory Visit: Payer: Self-pay | Admitting: *Deleted

## 2023-10-24 ENCOUNTER — Other Ambulatory Visit: Payer: Self-pay

## 2023-10-24 NOTE — Patient Outreach (Signed)
  Care Coordination   Follow Up Visit Note   10/24/2023 Name: Sean Wood. MRN: [id number] DOB: 1936/07/02  Sean Wood. is a 87 y.o. year old male who sees Pardue, Monico Blitz, DO for primary care. I spoke with  Sean Wood. by phone today.  What matters to the patients health and wellness today?  Patient had colonoscopy done on 10/16, polyp removed, denies any issues with bleeding or complications.  Denies any urgent concerns, encouraged to contact this care manager with questions.     Goals Addressed             This Visit's Progress    COMPLETED: Effective management of COPD   On track    Care Coordination Interventions: Provided patient with basic written and verbal COPD education on self care/management/and exacerbation prevention Advised patient to track and manage COPD triggers Advised patient to self assesses COPD action plan zone and make appointment with provider if in the yellow zone for 48 hours without improvement Provided education about and advised patient to utilize infection prevention strategies to reduce risk of respiratory infection         SDOH assessments and interventions completed:  No     Care Coordination Interventions:  Yes, provided   Interventions Today    Flowsheet Row Most Recent Value  Chronic Disease   Chronic disease during today's visit Chronic Obstructive Pulmonary Disease (COPD), Other  [polyp removal]  General Interventions   General Interventions Discussed/Reviewed General Interventions Reviewed, Health Screening, Vaccines, Doctor Visits  Vaccines Flu  Doctor Visits Discussed/Reviewed Annual Wellness Visits, PCP, Specialist, Doctor Visits Reviewed  [upcoming - GI 11/14, oncology 2/6, and AWV 6/16]  Health Screening Colonoscopy  PCP/Specialist Visits Compliance with follow-up visit  Education Interventions   Education Provided Provided Education  Provided Verbal Education On Medication, When to see the doctor        Follow up plan: No further intervention required.   Encounter Outcome:  Patient Visit Completed   Kemper Durie RN, MSN, CCM [place]  [facility], [facility] RN Care Coordinator Direct Dial: [phone] / Main [phone] Fax [phone] Email: [email or web address]

## 2023-10-25 ENCOUNTER — Other Ambulatory Visit: Payer: Self-pay

## 2023-10-25 NOTE — Progress Notes (Signed)
Specialty Pharmacy Ongoing Clinical Assessment Note  Sean Wood. is a 87 y.o. male who is being followed by the specialty pharmacy service for RxSp Oncology   Patient's specialty medication(s) reviewed today: Apalutamide   Missed doses in the last 4 weeks: 0   Patient/Caregiver did not have any additional questions or concerns.   Therapeutic benefit summary: Patient is achieving benefit   Adverse events/side effects summary: No adverse events/side effects   Patient's therapy is appropriate to: Continue    Goals Addressed             This Visit's Progress    Slow Disease Progression       Patient is on track. Patient will maintain adherence. PSA remains stable at 0.42 from 10/20/23 labs.          Follow up:  6 months  Otto Herb Specialty Pharmacist

## 2023-10-25 NOTE — Progress Notes (Signed)
Specialty Pharmacy Refill Coordination Note  Sean Wood. is a 87 y.o. male contacted today regarding refills of specialty medication(s) Apalutamide   Patient requested Delivery   Delivery date: 11/08/23   Verified address: [address]  [place] [address]   Medication will be filled on 11/07/23.   [employer].

## 2023-11-02 NOTE — Progress Notes (Unsigned)
[name], [name] [address]  [address]  [place], [place] [address]  Main: [phone]  Fax: [phone]   Primary Care Physician: Sherlyn Hay, DO  Primary Gastroenterologist:  [name], [name] / Dr. Midge Minium    CC:  F/U Rectal Bleeding and weight loss  HPI: Sean Wood. is a 87 y.o. male returns for 6-week follow-up of rectal bleeding and weight loss.  His weight is up 2 pounds in the past 2 weeks and up 4 pounds in the past 6 weeks.  Stable and improved.  He is feeling better.  He has not had any more episodes of rectal bleeding since his colonoscopy.  Appetite has improved.  Denies abdominal pain.  He has no GI symptoms or concerns today.  Labs 09/2023 showed normal CBC, CMP, PSA, and TSH.  Hemoglobin 13.2.  Colonoscopy 10/05/2023 by Dr. Servando Snare showed 15 mm tubular adenoma polyp removed from sigmoid colon, 4 (3 mm to 10 mm) tubulaovillous and tubular adenoma polyps removed from ascending colon, grade 1 internal hemorrhoids, excellent prep.  Negative for dysplasia.  No repeat colonoscopy was recommended due to advanced age.  Current Outpatient Medications  Medication Sig Dispense Refill   albuterol (VENTOLIN HFA) 108 (90 Base) MCG/ACT inhaler Inhale 2 puffs into the lungs every 6 (six) hours as needed for wheezing or shortness of breath. 8 g 2   apalutamide (ERLEADA) 60 MG tablet Take 1 tablet (60 mg total) by mouth daily. 30 tablet 3   B Complex Vitamins (B COMPLEX VITAMIN PO) Take by mouth.     Blood Glucose Monitoring Suppl DEVI 1 each by Does not apply route in the morning, at noon, and at bedtime. May substitute to any manufacturer covered by patient's insurance. 1 each 0   calcium-vitamin D (OSCAL WITH D) 500-5 MG-MCG tablet Take 2 tablets by mouth daily. 90 tablet 1   cholecalciferol (VITAMIN D3) 25 MCG (1000 UNIT) tablet Take 1,000 Units by mouth daily.     clobetasol (TEMOVATE) 0.05 % external solution Apply 1 Application topically 2 (two) times  daily. 50 mL 0   clobetasol cream (TEMOVATE) 0.05 % Apply 1 Application topically 2 (two) times daily. 30 g 0   co-enzyme Q-10 30 MG capsule COENZYME Q10 (NON-VA MED) CAP/TAB                                      Non-VA                                                                        Feb 03, 2007                                        Non-VA  Documented by:  Hollie Beach                                    Documented at:  [facility]     cyanocobalamin (VITAMIN B12) 1000 MCG tablet Take 1,000 mcg by mouth daily.     fluticasone-salmeterol (ADVAIR DISKUS) 250-50 MCG/ACT AEPB Inhale 1 puff into the lungs in the morning and at bedtime. 60 each 2   ibuprofen (ADVIL) 100 MG tablet Take by mouth.     Lancets (ONETOUCH ULTRASOFT) lancets Use to check sugar daily for type 2 diabetes E11.9 100 each 0   No current facility-administered medications for this visit.    Allergies as of 11/03/2023 - Review Complete 11/03/2023  Allergen Reaction Noted   Mevacor [lovastatin] Other (See Comments) 05/16/2015   Niacin and related Other (See Comments) 04/24/2015   Statins Other (See Comments) 04/24/2015   Aleve [naproxen sodium] Rash 04/24/2015    Past Medical History:  Diagnosis Date   Arthritis    COPD (chronic obstructive pulmonary disease) (HCC)    Elevated PSA    Hematuria, gross    History of hiatal hernia    HOH (hard of hearing)    Kidney stone    Prostate cancer (HCC)    Shortness of breath dyspnea     Past Surgical History:  Procedure Laterality Date   BACK SURGERY  1973, 1987   COLONOSCOPY WITH PROPOFOL N/A 10/05/2023   Procedure: COLONOSCOPY WITH PROPOFOL;  Surgeon: Midge Minium, MD;  Location: [facility] ENDOSCOPY;  Service: Endoscopy;  Laterality: N/A;   CYSTOSCOPY  06/24/2015   Procedure: CYSTOSCOPY;  Surgeon: Vanna Scotland, MD;  Location: [facility] ORS;  Service:  Urology;;   HEMOSTASIS CLIP PLACEMENT  10/05/2023   Procedure: HEMOSTASIS CLIP PLACEMENT;  Surgeon: Midge Minium, MD;  Location: [facility] ENDOSCOPY;  Service: Endoscopy;;   HERNIA REPAIR     left and right inguinal hernia repair   HOT HEMOSTASIS  10/05/2023   Procedure: HOT HEMOSTASIS (ARGON PLASMA COAGULATION/BICAP);  Surgeon: Midge Minium, MD;  Location[facility];  Service: Endoscopy;;   POLYPECTOMY  10/05/2023   Procedure: POLYPECTOMY;  Surgeon: Midge Minium, MD;  Location: [facility] ENDOSCOPY;  Service: Endoscopy;;   RADIOACTIVE SEED IMPLANT N/A 06/24/2015   Procedure: RADIOACTIVE SEED IMPLANT/BRACHYTHERAPY IMPLANT;  Surgeon: Vanna Scotland, MD;  Location: [facility] ORS;  Service: Urology;  Laterality: N/A;   SUBMUCOSAL TATTOO INJECTION  10/05/2023   Procedure: SUBMUCOSAL TATTOO INJECTION;  Surgeon: Midge Minium, MD;  Location: [facility] ENDOSCOPY;  Service: Endoscopy;;    Review of Systems:    All systems reviewed and negative except where noted in HPI.   Physical Examination:   BP 137/72   Pulse 77   Temp 97.7 F (36.5 C)   Ht 5\' 9"  (1.753 m)   Wt 143 lb (64.9 kg)   BMI 21.12 kg/m   General: Well-nourished, elderly male, well-developed in no acute distress.  Lungs:  Appears mildly short of breath at rest. Neuro: Alert and oriented x 3.  Grossly intact.  Psych: Alert and cooperative, normal mood and affect.   Imaging Studies: No results found.  Assessment and Plan:   Sean Wood. is a 87 y.o. y/o male returns for follow-up of rectal bleeding and weight loss.  Recent colonoscopy showed internal hemorrhoids and 4 small adenomatous polyps removed.  No further colonoscopies were recommended due to advanced age.  CBC, CMP, TSH, and PSA  labs were normal.  1.  Rectal bleeding -resolved; likely due to large sigmoid colon polyp versus internal hemorrhoids. 2.  Internal hemorrhoids 3.  History of adenomatous colon polyps -removed during colonoscopy 09/2023. 4.  Weight loss -resolved.   Stable and improved. 5.  History of prostate cancer diagnosed 2009, salvage therapy 2016 -followed by oncology. 6.  COPD/emphysema  Plan: 1.  If he has recurrent rectal bleeding, then recommend hydrocortisone suppositories to treat internal hemorrhoid bleeding. 2.  No further colonoscopies unless he has worsening or recurrent GI symptoms 3.  No further GI test recommended at this time. 4.  Follow-up with PCP and pulmonology  [name], [name]  Follow up as needed if he has recurrent GI symptoms.

## 2023-11-03 ENCOUNTER — Ambulatory Visit: Payer: PPO | Admitting: Physician Assistant

## 2023-11-03 ENCOUNTER — Encounter: Payer: Self-pay | Admitting: Physician Assistant

## 2023-11-03 VITALS — BP 137/72 | HR 77 | Temp 97.7°F | Ht 69.0 in | Wt 143.0 lb

## 2023-11-03 DIAGNOSIS — K648 Other hemorrhoids: Secondary | ICD-10-CM | POA: Diagnosis not present

## 2023-11-03 DIAGNOSIS — Z8546 Personal history of malignant neoplasm of prostate: Secondary | ICD-10-CM | POA: Diagnosis not present

## 2023-11-03 DIAGNOSIS — K649 Unspecified hemorrhoids: Secondary | ICD-10-CM

## 2023-11-03 DIAGNOSIS — Z860101 Personal history of adenomatous and serrated colon polyps: Secondary | ICD-10-CM

## 2023-11-03 DIAGNOSIS — Z860100 Personal history of colon polyps, unspecified: Secondary | ICD-10-CM

## 2023-11-03 DIAGNOSIS — F17220 Nicotine dependence, chewing tobacco, uncomplicated: Secondary | ICD-10-CM | POA: Diagnosis not present

## 2023-11-07 ENCOUNTER — Other Ambulatory Visit (HOSPITAL_COMMUNITY): Payer: Self-pay

## 2023-11-08 ENCOUNTER — Other Ambulatory Visit (HOSPITAL_COMMUNITY): Payer: Self-pay

## 2023-11-24 ENCOUNTER — Other Ambulatory Visit (HOSPITAL_COMMUNITY): Payer: Self-pay

## 2023-11-24 ENCOUNTER — Other Ambulatory Visit (HOSPITAL_COMMUNITY): Payer: Self-pay | Admitting: Pharmacy Technician

## 2023-11-24 NOTE — Progress Notes (Signed)
Specialty Pharmacy Refill Coordination Note  Sean Wood. is a 87 y.o. male contacted today regarding refills of specialty medication(s) Apalutamide  Spoke with Daughter   Patient requested Delivery   Delivery date: 12/07/23   Verified address: [address]  [place]   Medication will be filled on 12/06/23.

## 2023-12-06 ENCOUNTER — Other Ambulatory Visit: Payer: Self-pay

## 2023-12-07 ENCOUNTER — Encounter: Payer: Self-pay | Admitting: Gastroenterology

## 2023-12-22 ENCOUNTER — Ambulatory Visit: Payer: Self-pay

## 2023-12-22 NOTE — Telephone Encounter (Signed)
 Patient called, left VM to return the call to the office to speak to the NT.   Summary: Request for antibiotic   Pt is calling in requesting an antibiotic. Pt says he is coughing and his head is hurting and his nose is running, pt believes he has a sinus infection or something. Pt would like it sent to [facility].

## 2023-12-22 NOTE — Telephone Encounter (Signed)
 2nd attempt, Patient called, left VM to return the call to the office to speak to the NT. Will route to provider for review.   Summary: Request for antibiotic   Pt is calling in requesting an antibiotic. Pt says he is coughing and his head is hurting and his nose is running, pt believes he has a sinus infection or something. Pt would like it sent to [facility].

## 2023-12-23 NOTE — Telephone Encounter (Signed)
 LVMTCB. CRM created. Ok for [facility] to advise/clarify.

## 2023-12-27 ENCOUNTER — Other Ambulatory Visit (HOSPITAL_COMMUNITY): Payer: Self-pay | Admitting: Pharmacy Technician

## 2023-12-27 ENCOUNTER — Other Ambulatory Visit (HOSPITAL_COMMUNITY): Payer: Self-pay

## 2023-12-27 NOTE — Telephone Encounter (Signed)
 I attempted to call patient. I reached patient's son who advised that patient passed away on 03-25-2024.

## 2023-12-27 NOTE — Progress Notes (Signed)
 Disenrolled; Spoke with Daughter Lyla Son & Her father passed away on 04-03-2024 01/17/24

## 2023-12-28 ENCOUNTER — Telehealth: Payer: Self-pay | Admitting: Family Medicine

## 2023-12-28 ENCOUNTER — Telehealth: Payer: Self-pay

## 2023-12-28 NOTE — Telephone Encounter (Signed)
 Created in error

## 2023-12-28 NOTE — Telephone Encounter (Signed)
 Copied from CRM [id number]. Topic: General - Deceased Patient >> 2024/01/15  4:12 PM Yolanda T wrote: Name of caller: Graig from [facility]  Date of death: 01/13/2024   Name of funeral home: [facility]  Phone number of funeral home: [phone]  Provider that needs to sign form: Dr Lauraine Buoy  Timeline for signing: as soon as possible   Graig stated she sent a copy of death cert thru the [employer] System for signature

## 2023-12-29 ENCOUNTER — Telehealth: Payer: Self-pay

## 2023-12-29 NOTE — Telephone Encounter (Signed)
 Copied from CRM [id number]. Topic: General - Deceased Patient >> 2024/01/15  4:12 PM Yolanda T wrote: Name of caller: Graig from [facility]  Date of death: 01/13/2024   Name of funeral home: [facility]  Phone number of funeral home: [phone]  Provider that needs to sign form: Dr Lauraine Buoy  Timeline for signing: as soon as possible   Graig stated she sent a copy of death cert thru the [employer] System for signature

## 2023-12-29 NOTE — Telephone Encounter (Signed)
 Death certified in [employer].

## 2023-12-31 NOTE — Telephone Encounter (Signed)
 Called to advise this is a duplicate request, and the system does not allow me to do anything with it (the information is already there and all options are grayed out). They will be looking into it.

## 2024-01-21 DEATH — deceased

## 2024-01-26 ENCOUNTER — Ambulatory Visit: Payer: PPO | Admitting: Oncology

## 2024-01-26 ENCOUNTER — Other Ambulatory Visit: Payer: PPO

## 2024-07-19 ENCOUNTER — Encounter: Payer: PPO | Admitting: Family Medicine

## 2024-07-20 ENCOUNTER — Encounter: Payer: PPO | Admitting: Family Medicine
